# Patient Record
Sex: Female | Born: 1975 | Marital: Married
Health system: Southern US, Community
[De-identification: ages and names within clinical notes are randomized; demographics above are authoritative.]

## PROBLEM LIST (undated history)

## (undated) DIAGNOSIS — I639 Cerebral infarction, unspecified: Secondary | ICD-10-CM

## (undated) DIAGNOSIS — M797 Fibromyalgia: Secondary | ICD-10-CM

## (undated) DIAGNOSIS — E041 Nontoxic single thyroid nodule: Secondary | ICD-10-CM

## (undated) DIAGNOSIS — R911 Solitary pulmonary nodule: Secondary | ICD-10-CM

## (undated) DIAGNOSIS — G4700 Insomnia, unspecified: Secondary | ICD-10-CM

## (undated) DIAGNOSIS — F4310 Post-traumatic stress disorder, unspecified: Secondary | ICD-10-CM

## (undated) DIAGNOSIS — F32A Depression, unspecified: Secondary | ICD-10-CM

## (undated) HISTORY — PX: APPENDECTOMY: SHX54

## (undated) HISTORY — DX: Cerebral infarction, unspecified: I63.9

## (undated) HISTORY — PX: CHOLECYSTECTOMY: SHX55

---

## 2015-03-10 DIAGNOSIS — M545 Low back pain, unspecified: Secondary | ICD-10-CM | POA: Insufficient documentation

## 2015-03-10 DIAGNOSIS — G8929 Other chronic pain: Secondary | ICD-10-CM | POA: Insufficient documentation

## 2015-08-17 DIAGNOSIS — N926 Irregular menstruation, unspecified: Secondary | ICD-10-CM | POA: Insufficient documentation

## 2018-05-27 DIAGNOSIS — N6019 Diffuse cystic mastopathy of unspecified breast: Secondary | ICD-10-CM | POA: Insufficient documentation

## 2018-05-27 DIAGNOSIS — D4860 Neoplasm of uncertain behavior of unspecified breast: Secondary | ICD-10-CM | POA: Insufficient documentation

## 2018-05-27 DIAGNOSIS — Z803 Family history of malignant neoplasm of breast: Secondary | ICD-10-CM | POA: Insufficient documentation

## 2018-05-27 HISTORY — DX: Family history of malignant neoplasm of breast: Z80.3

## 2020-06-29 DIAGNOSIS — Z20822 Contact with and (suspected) exposure to covid-19: Secondary | ICD-10-CM

## 2020-06-29 DIAGNOSIS — G4700 Insomnia, unspecified: Secondary | ICD-10-CM | POA: Insufficient documentation

## 2020-06-29 DIAGNOSIS — M62838 Other muscle spasm: Secondary | ICD-10-CM | POA: Insufficient documentation

## 2020-06-29 DIAGNOSIS — J4540 Moderate persistent asthma, uncomplicated: Secondary | ICD-10-CM | POA: Insufficient documentation

## 2020-06-29 HISTORY — DX: Contact with and (suspected) exposure to covid-19: Z20.822

## 2020-07-16 DIAGNOSIS — R059 Cough, unspecified: Secondary | ICD-10-CM | POA: Insufficient documentation

## 2020-07-16 HISTORY — DX: Cough, unspecified: R05.9

## 2020-09-03 DIAGNOSIS — K219 Gastro-esophageal reflux disease without esophagitis: Secondary | ICD-10-CM | POA: Insufficient documentation

## 2020-09-03 DIAGNOSIS — F411 Generalized anxiety disorder: Secondary | ICD-10-CM | POA: Insufficient documentation

## 2020-09-04 DIAGNOSIS — Z87898 Personal history of other specified conditions: Secondary | ICD-10-CM | POA: Insufficient documentation

## 2020-09-04 DIAGNOSIS — R911 Solitary pulmonary nodule: Secondary | ICD-10-CM

## 2020-09-04 DIAGNOSIS — E079 Disorder of thyroid, unspecified: Secondary | ICD-10-CM | POA: Insufficient documentation

## 2020-09-04 HISTORY — DX: Solitary pulmonary nodule: R91.1

## 2020-09-15 DIAGNOSIS — R002 Palpitations: Secondary | ICD-10-CM | POA: Insufficient documentation

## 2020-09-15 DIAGNOSIS — I951 Orthostatic hypotension: Secondary | ICD-10-CM | POA: Insufficient documentation

## 2020-11-12 DIAGNOSIS — F331 Major depressive disorder, recurrent, moderate: Secondary | ICD-10-CM | POA: Insufficient documentation

## 2020-11-12 DIAGNOSIS — R251 Tremor, unspecified: Secondary | ICD-10-CM | POA: Insufficient documentation

## 2020-11-12 DIAGNOSIS — F4312 Post-traumatic stress disorder, chronic: Secondary | ICD-10-CM | POA: Insufficient documentation

## 2020-11-12 DIAGNOSIS — G939 Disorder of brain, unspecified: Secondary | ICD-10-CM | POA: Insufficient documentation

## 2021-02-11 DIAGNOSIS — D500 Iron deficiency anemia secondary to blood loss (chronic): Secondary | ICD-10-CM | POA: Insufficient documentation

## 2021-02-17 DIAGNOSIS — J020 Streptococcal pharyngitis: Secondary | ICD-10-CM | POA: Insufficient documentation

## 2021-02-17 HISTORY — DX: Streptococcal pharyngitis: J02.0

## 2021-04-16 ENCOUNTER — Other Ambulatory Visit: Payer: Self-pay

## 2021-04-16 ENCOUNTER — Ambulatory Visit: Admission: EM | Admit: 2021-04-16 | Discharge: 2021-04-16 | Disposition: A | Discharge status: Home / Self Care

## 2021-04-16 ENCOUNTER — Encounter: Payer: Self-pay | Admitting: Emergency Medicine

## 2021-04-16 DIAGNOSIS — B349 Viral infection, unspecified: Secondary | ICD-10-CM | POA: Diagnosis not present

## 2021-04-16 HISTORY — DX: Depression, unspecified: F32.A

## 2021-04-16 HISTORY — DX: Solitary pulmonary nodule: R91.1

## 2021-04-16 HISTORY — DX: Post-traumatic stress disorder, unspecified: F43.10

## 2021-04-16 HISTORY — DX: Insomnia, unspecified: G47.00

## 2021-04-16 HISTORY — DX: Nontoxic single thyroid nodule: E04.1

## 2021-04-16 HISTORY — DX: Fibromyalgia: M79.7

## 2021-04-16 NOTE — ED Triage Notes (Signed)
Patient c/o productive cough w/ " green" sputum x 4 days.   Patient c/o SOB x 2 days.   Patient endorses fever, chills, headache, and vomiting.   Patient endorses a temperature of 101.3 F at home.   Patient endorses chest pain when coughing.   Patient denies diarrhea or LOC.   Patient has used Advil for fever with relief of symptoms.

## 2021-04-16 NOTE — Discharge Instructions (Addendum)
Your COVID and Influenza tests are pending.  You should self quarantine until the test results are back.    Take Tylenol or ibuprofen as needed for fever or discomfort.  Rest and keep yourself hydrated.    Follow-up with your primary care provider if your symptoms are not improving.

## 2021-04-16 NOTE — ED Provider Notes (Signed)
[name]    CSN: [id number] Arrival date & time: 04/16/21  1020      History   Chief Complaint Chief Complaint  Patient presents with   Cough   Shortness of Breath     HPI Sandra Aguilar is a 45 y.o. female.  Patient presents with 4-day history of fever, earache, sore throat, cough, shortness of breath, nausea, vomiting.  She denies rash, diarrhea, or other symptoms.  Treatment attempted at home with Advil and Zofran; last taken this morning at 0800.  Her husband has similar symptoms.  Her medical history includes fibromyalgia, lung nodule, thyroid nodule, depression, PTSD, and insomnia.  The history is provided by the patient and medical records.   Past Medical History:  Diagnosis Date   Depression    Fibromyalgia    Insomnia    Lung nodule    PTSD (post-traumatic stress disorder)    Thyroid nodule     There are no problems to display for this patient.   Past Surgical History:  Procedure Laterality Date   APPENDECTOMY     CHOLECYSTECTOMY      OB History   No obstetric history on file.      Home Medications    Prior to Admission medications   Medication Sig Start Date End Date Taking? Authorizing Provider  FLUoxetine (PROZAC) 40 MG capsule Take 60 mg by mouth. 02/12/21  Yes [provider]  trazodone (DESYREL) 300 MG tablet Take 300 mg by mouth at bedtime. 02/12/21  Yes [provider]    Family History History reviewed. No pertinent family history.  Social History Social History   Tobacco Use   Smoking status: Never    Passive exposure: Never   Smokeless tobacco: Never  Substance Use Topics   Alcohol use: Yes    Comment: ocassionally   Drug use: Never     Allergies   Ciprofloxacin and Sulfa antibiotics   Review of Systems Review of Systems  Constitutional:  Positive for chills, fatigue and fever.  HENT:  Positive for ear pain and sore throat.   Eyes:  Negative for pain and visual disturbance.   Respiratory:  Positive for cough and shortness of breath.   Cardiovascular:  Negative for chest pain and palpitations.  Gastrointestinal:  Positive for nausea and vomiting. Negative for abdominal pain and diarrhea.  Genitourinary:  Negative for dysuria and hematuria.  Musculoskeletal:  Negative for arthralgias and back pain.  Skin:  Negative for color change and rash.  Neurological:  Negative for seizures and syncope.  All other systems reviewed and are negative.   Physical Exam Triage Vital Signs ED Triage Vitals  Enc Vitals Group     BP      Pulse      Resp      Temp      Temp src      SpO2      Weight      Height      Head Circumference      Peak Flow      Pain Score      Pain Loc      Pain Edu?      Excl. in [place]?    No data found.  Updated Vital Signs BP 106/71 (BP Location: Left Arm)   Pulse 86   Temp 98.3 F (36.8 C) (Oral)   Resp 18   LMP 03/27/2021 (Exact Date)   SpO2 95%   Visual Acuity Right Eye Distance:   Left  Eye Distance:   Bilateral Distance:    Right Eye Near:   Left Eye Near:    Bilateral Near:     Physical Exam Vitals and nursing note reviewed.  Constitutional:      General: She is not in acute distress.    Appearance: She is well-developed. She is ill-appearing.  HENT:     Head: Normocephalic and atraumatic.     Right Ear: Tympanic membrane normal.     Left Ear: Tympanic membrane normal.     Nose: Nose normal.     Mouth/Throat:     Mouth: Mucous membranes are moist.     Pharynx: Oropharynx is clear.  Eyes:     Conjunctiva/sclera: Conjunctivae normal.  Cardiovascular:     Rate and Rhythm: Normal rate and regular rhythm.     Heart sounds: Normal heart sounds.  Pulmonary:     Effort: Pulmonary effort is normal. No respiratory distress.     Breath sounds: Normal breath sounds.  Abdominal:     General: Bowel sounds are normal. There is no distension.     Palpations: Abdomen is soft.     Tenderness: There is no abdominal  tenderness. There is no guarding or rebound.  Musculoskeletal:     Cervical back: Neck supple.  Skin:    General: Skin is warm and dry.     Findings: No rash.  Neurological:     General: No focal deficit present.     Mental Status: She is alert and oriented to person, place, and time.     Gait: Gait normal.  Psychiatric:        Mood and Affect: Mood normal.        Behavior: Behavior normal.     UC Treatments / Results  Labs (all labs ordered are listed, but only abnormal results are displayed) Labs Reviewed  COVID-19, FLU A+B NAA    EKG   Radiology No results found.  Procedures Procedures (including critical care time)  Medications Ordered in UC Medications - No data to display  Initial Impression / Assessment and Plan / UC Course  I have reviewed the triage vital signs and the nursing notes.  Pertinent labs & imaging results that were available during my care of the patient were reviewed by me and considered in my medical decision making (see chart for details).   Viral illness.  Influenza and COVID pending.  Instructed patient to self quarantine per CDC guidelines.  Discussed symptomatic treatment including Tylenol or ibuprofen, rest, hydration.  Patient states she has Zofran at home already and does not need an additional prescription.  Instructed her to keep her self hydrated with clear liquids.  Instructed patient to follow up with PCP if her symptoms are not improving.  Patient agrees to plan of care.    Final Clinical Impressions(s) / UC Diagnoses   Final diagnoses:  Viral illness     Discharge Instructions      Your COVID and Influenza tests are pending.  You should self quarantine until the test results are back.    Take Tylenol or ibuprofen as needed for fever or discomfort.  Rest and keep yourself hydrated.    Follow-up with your primary care provider if your symptoms are not improving.         ED Prescriptions   None    PDMP not  reviewed this encounter.   Sharion Balloon, NP 04/16/21 1055

## 2021-04-18 LAB — COVID-19, FLU A+B NAA
Influenza A, NAA: NOT DETECTED
Influenza B, NAA: NOT DETECTED
SARS-CoV-2, NAA: NOT DETECTED

## 2021-04-21 ENCOUNTER — Encounter: Payer: Self-pay | Admitting: Emergency Medicine

## 2021-04-21 ENCOUNTER — Ambulatory Visit
Admission: EM | Admit: 2021-04-21 | Discharge: 2021-04-21 | Disposition: A | Discharge status: Home / Self Care | Attending: Emergency Medicine | Admitting: Emergency Medicine

## 2021-04-21 ENCOUNTER — Other Ambulatory Visit: Payer: Self-pay

## 2021-04-21 DIAGNOSIS — J0100 Acute maxillary sinusitis, unspecified: Secondary | ICD-10-CM | POA: Diagnosis not present

## 2021-04-21 MED ORDER — AZITHROMYCIN 250 MG PO TABS: 250.0000 mg | ORAL_TABLET | Freq: Every day | ORAL | 0 refills | Status: DC

## 2021-04-21 NOTE — ED Triage Notes (Signed)
Patient c/o productive cough w/ "dark green" sputum and fever x 7 days.   Patient endorses a temperature of 101 F at home.   Patient endorse generalized body aches and headaches. Patient endorses nasal congestion.   Patient denies SOB.   Patient was seen previously at this clinic. Patient states her COVID and Flu test was negative.   Patient has taken OTC cough medicine with no relief of symptoms.

## 2021-04-21 NOTE — ED Provider Notes (Signed)
Sandra Aguilar    CSN: [id number] Arrival date & time: 04/21/21  1456      History   Chief Complaint Chief Complaint  Patient presents with   Cough   Fever     HPI Sandra Aguilar is a 45 y.o. female.  Patient presents with ongoing fever, body aches, headaches, nasal congestion, cough productive of green sputum x7 days.  T-max 101.  She denies shortness of breath, vomiting, diarrhea, or other symptoms.  OTC treatment attempted at home.  Patient was seen here on 04/16/2021; diagnosed with viral illness; COVID and flu negative; treated symptomatically.  Her medical history includes chronic cough, chronic fatigue, chronic pain, thyroid nodule, fibromyalgia, PTSD, depression, panic attacks, insomnia.  The history is provided by the patient and medical records.   Past Medical History:  Diagnosis Date   Depression    Fibromyalgia    Insomnia    Lung nodule    PTSD (post-traumatic stress disorder)    Thyroid nodule     There are no problems to display for this patient.   Past Surgical History:  Procedure Laterality Date   APPENDECTOMY     CHOLECYSTECTOMY      OB History   No obstetric history on file.      Home Medications    Prior to Admission medications   Medication Sig Start Date End Date Taking? Authorizing Provider  azithromycin (ZITHROMAX) 250 MG tablet Take 1 tablet (250 mg total) by mouth daily. Take first 2 tablets together, then 1 every day until finished. 04/21/21  Yes Sharion Balloon, NP  FLUoxetine (PROZAC) 40 MG capsule Take 60 mg by mouth. 02/12/21  Yes [provider]  trazodone (DESYREL) 300 MG tablet Take 300 mg by mouth at bedtime. 02/12/21  Yes [provider]    Family History History reviewed. No pertinent family history.  Social History Social History   Tobacco Use   Smoking status: Never    Passive exposure: Never   Smokeless tobacco: Never  Substance Use Topics   Alcohol use: Yes    Comment: ocassionally    Drug use: Never     Allergies   Ciprofloxacin and Sulfa antibiotics   Review of Systems Review of Systems  Constitutional:  Positive for fever. Negative for chills.  HENT:  Positive for congestion. Negative for ear pain and sore throat.   Respiratory:  Positive for cough. Negative for shortness of breath.   Cardiovascular:  Negative for chest pain and palpitations.  Gastrointestinal:  Negative for abdominal pain and vomiting.  Skin:  Negative for color change and rash.  All other systems reviewed and are negative.   Physical Exam Triage Vital Signs ED Triage Vitals  Enc Vitals Group     BP      Pulse      Resp      Temp      Temp src      SpO2      Weight      Height      Head Circumference      Peak Flow      Pain Score      Pain Loc      Pain Edu?      Excl. in [place]?    No data found.  Updated Vital Signs BP 117/73 (BP Location: Left Arm)   Pulse 77   Temp 98.7 F (37.1 C) (Oral)   Resp 18   LMP 04/18/2021 (Exact Date)   SpO2 94%  Visual Acuity Right Eye Distance:   Left Eye Distance:   Bilateral Distance:    Right Eye Near:   Left Eye Near:    Bilateral Near:     Physical Exam Vitals and nursing note reviewed.  Constitutional:      General: She is not in acute distress.    Appearance: She is well-developed. She is not ill-appearing.  HENT:     Head: Normocephalic and atraumatic.     Right Ear: Tympanic membrane normal.     Left Ear: Tympanic membrane normal.     Nose: Congestion and rhinorrhea present.     Mouth/Throat:     Mouth: Mucous membranes are moist.     Pharynx: Oropharynx is clear.  Eyes:     Conjunctiva/sclera: Conjunctivae normal.  Cardiovascular:     Rate and Rhythm: Normal rate and regular rhythm.     Heart sounds: Normal heart sounds. No murmur heard. Pulmonary:     Effort: Pulmonary effort is normal. No respiratory distress.     Breath sounds: Normal breath sounds.  Abdominal:     Palpations: Abdomen is soft.      Tenderness: There is no abdominal tenderness.  Musculoskeletal:     Cervical back: Neck supple.  Skin:    General: Skin is warm and dry.  Neurological:     General: No focal deficit present.     Mental Status: She is alert and oriented to person, place, and time.     Gait: Gait normal.  Psychiatric:        Mood and Affect: Mood normal.        Behavior: Behavior normal.     UC Treatments / Results  Labs (all labs ordered are listed, but only abnormal results are displayed) Labs Reviewed - No data to display  EKG   Radiology No results found.  Procedures Procedures (including critical care time)  Medications Ordered in UC Medications - No data to display  Initial Impression / Assessment and Plan / UC Course  I have reviewed the triage vital signs and the nursing notes.  Pertinent labs & imaging results that were available during my care of the patient were reviewed by me and considered in my medical decision making (see chart for details).   Acute sinusitis.  Treating with Zithromax.  Instructed patient to continue symptomatic treatment with Tylenol or ibuprofen as needed for fever or discomfort and plain over-the-counter Mucinex as needed for congestion.  Instructed her to follow-up with her PCP if her symptoms are not improving.  She agrees to plan of care.   Final Clinical Impressions(s) / UC Diagnoses   Final diagnoses:  Acute non-recurrent maxillary sinusitis     Discharge Instructions      Take the Zithromax as directed.    Follow up with your primary care provider if your symptoms are not improving.         ED Prescriptions     Medication Sig Dispense Auth. Provider   azithromycin (ZITHROMAX) 250 MG tablet Take 1 tablet (250 mg total) by mouth daily. Take first 2 tablets together, then 1 every day until finished. 6 tablet Sharion Balloon, NP      I have reviewed the PDMP during this encounter.   Sharion Balloon, NP 04/21/21 [phone]

## 2021-04-21 NOTE — Discharge Instructions (Addendum)
Take the Zithromax as directed.  Follow up with your primary care provider if your symptoms are not improving.    

## 2021-05-11 DIAGNOSIS — G4733 Obstructive sleep apnea (adult) (pediatric): Secondary | ICD-10-CM | POA: Insufficient documentation

## 2021-05-11 DIAGNOSIS — M797 Fibromyalgia: Secondary | ICD-10-CM | POA: Insufficient documentation

## 2021-05-11 DIAGNOSIS — G43109 Migraine with aura, not intractable, without status migrainosus: Secondary | ICD-10-CM | POA: Insufficient documentation

## 2021-05-11 DIAGNOSIS — I8391 Asymptomatic varicose veins of right lower extremity: Secondary | ICD-10-CM | POA: Insufficient documentation

## 2021-05-11 DIAGNOSIS — K2100 Gastro-esophageal reflux disease with esophagitis, without bleeding: Secondary | ICD-10-CM | POA: Insufficient documentation

## 2021-05-31 ENCOUNTER — Other Ambulatory Visit (INDEPENDENT_AMBULATORY_CARE_PROVIDER_SITE_OTHER): Payer: Self-pay | Admitting: Nurse Practitioner

## 2021-05-31 DIAGNOSIS — I83811 Varicose veins of right lower extremities with pain: Secondary | ICD-10-CM

## 2021-06-02 ENCOUNTER — Encounter (INDEPENDENT_AMBULATORY_CARE_PROVIDER_SITE_OTHER): Admitting: Nurse Practitioner

## 2021-06-02 ENCOUNTER — Encounter (INDEPENDENT_AMBULATORY_CARE_PROVIDER_SITE_OTHER): Payer: Self-pay | Admitting: Nurse Practitioner

## 2021-06-02 ENCOUNTER — Encounter (INDEPENDENT_AMBULATORY_CARE_PROVIDER_SITE_OTHER)

## 2021-09-29 ENCOUNTER — Other Ambulatory Visit: Payer: Self-pay

## 2021-09-29 ENCOUNTER — Ambulatory Visit (INDEPENDENT_AMBULATORY_CARE_PROVIDER_SITE_OTHER)

## 2021-09-29 ENCOUNTER — Ambulatory Visit (INDEPENDENT_AMBULATORY_CARE_PROVIDER_SITE_OTHER): Admitting: Nurse Practitioner

## 2021-09-29 ENCOUNTER — Encounter (INDEPENDENT_AMBULATORY_CARE_PROVIDER_SITE_OTHER): Payer: Self-pay | Admitting: Nurse Practitioner

## 2021-09-29 VITALS — BP 115/79 | HR 91 | Resp 16 | Ht 65.5 in | Wt 116.0 lb

## 2021-09-29 DIAGNOSIS — I83811 Varicose veins of right lower extremities with pain: Secondary | ICD-10-CM | POA: Diagnosis not present

## 2021-09-29 DIAGNOSIS — M79604 Pain in right leg: Secondary | ICD-10-CM

## 2021-10-10 ENCOUNTER — Encounter (INDEPENDENT_AMBULATORY_CARE_PROVIDER_SITE_OTHER): Payer: Self-pay | Admitting: Nurse Practitioner

## 2021-10-10 NOTE — Progress Notes (Signed)
Subjective:    Patient ID: Sandra Aguilar, female    DOB: Dec 31, 1975, 45 y.o.   MRN: [id number] Chief Complaint  Patient presents with   New Patient (Initial Visit)    Consult vv of rle w/pain    Sandra Aguilar is a 45 year old female that presents today as a referral from [place], NP. The patient presents with pain in her right lower extremity and there is concern for posible thrombosis vs. Varicose vein.  The patient has a signifiant family history of DVT but she has had none herself.  She also has fibromyalgia.   Today noninvasive studies show no evidence of DVT or superficial thrombophlebitis.  No evidence of deep venous insufficiency or superficial venous reflux in the right lower extremity.   Review of Systems  Musculoskeletal:  Positive for myalgias.  All other systems reviewed and are negative.     Objective:   Physical Exam Vitals reviewed.  HENT:     Head: Normocephalic.  Cardiovascular:     Rate and Rhythm: Normal rate.     Pulses: Normal pulses.  Pulmonary:     Effort: Pulmonary effort is normal.  Musculoskeletal:     Right lower leg: No edema.     Left lower leg: No edema.  Skin:    General: Skin is warm and dry.  Neurological:     Mental Status: She is alert and oriented to person, place, and time.  Psychiatric:        Mood and Affect: Mood normal.        Behavior: Behavior normal.        Thought Content: Thought content normal.        Judgment: Judgment normal.    BP 115/79 (BP Location: Right Arm)   Pulse 91   Resp 16   Ht 5' 5.5" (1.664 m)   Wt 116 lb (52.6 kg)   BMI 19.01 kg/m   Past Medical History:  Diagnosis Date   Depression    Fibromyalgia    Insomnia    Lung nodule    PTSD (post-traumatic stress disorder)    Stroke (HCC)    Thyroid nodule     Social History   Socioeconomic History   Marital status: Married    Spouse name: Not on file   Number of children: Not on file   Years of education: Not on file   Highest  education level: Not on file  Occupational History   Not on file  Tobacco Use   Smoking status: Never    Passive exposure: Never   Smokeless tobacco: Never  Substance and Sexual Activity   Alcohol use: Yes    Comment: ocassionally   Drug use: Never   Sexual activity: Not on file  Other Topics Concern   Not on file  Social History Narrative   Not on file   Social Determinants of Health   Financial Resource Strain: Not on file  Food Insecurity: Not on file  Transportation Needs: Not on file  Physical Activity: Not on file  Stress: Not on file  Social Connections: Not on file  Intimate Partner Violence: Not on file    Past Surgical History:  Procedure Laterality Date   APPENDECTOMY     CHOLECYSTECTOMY      Family History  Problem Relation Age of Onset   Heart attack Father    Vascular Disease Father    Vascular Disease Maternal Uncle    Obesity Paternal Aunt    Vascular Disease Paternal  Aunt    Heart attack Maternal Grandmother    Vascular Disease Maternal Grandfather    Stroke Paternal Grandmother     Allergies  Allergen Reactions   Ciprofloxacin Other (See Comments) and Nausea And Vomiting    Neuropathy Neuropathy    Nitrofurantoin Hives    Other reaction(s): Hives/Swelling-Allergy   Sulfa Antibiotics Nausea And Vomiting and Other (See Comments)    CAUSES PAIN, PT REPORTS     No flowsheet data found.    CMP  No results found for: NA, K, CL, CO2, GLUCOSE, BUN, CREATININE, CALCIUM, PROT, ALBUMIN, AST, ALT, ALKPHOS, BILITOT, GFRNONAA, GFRAA   No results found.     Assessment & Plan:   1. Right leg pain The patient does have notable spider veins however there is no evidence of deep venous insufficiency or superficial venous reflux noted.  Typically the cause of pain as it relates to varicose veins is due to the presence of venous reflux.  There is also no evidence of DVT or superficial thrombophlebitis.  Based on the noninvasive studies the pain is  not related to varicose veins as well there is no intervention indicated.  Patient is advised to utilize medical grade compression stockings, elevation and activity.  She will follow-up with [place] on an as-needed basis.   Current Outpatient Medications on File Prior to Visit  Medication Sig Dispense Refill   sertraline (ZOLOFT) 50 MG tablet Take 50 mg by mouth daily.     trazodone (DESYREL) 300 MG tablet Take 300 mg by mouth at bedtime.     azithromycin (ZITHROMAX) 250 MG tablet Take 1 tablet (250 mg total) by mouth daily. Take first 2 tablets together, then 1 every day until finished. (Patient not taking: Reported on 09/29/2021) 6 tablet 0   FLUoxetine (PROZAC) 40 MG capsule Take 60 mg by mouth. (Patient not taking: Reported on 09/29/2021)     No current facility-administered medications on file prior to visit.    There are no Patient Instructions on file for this visit. No follow-ups on file.   Kris Hartmann, NP

## 2021-11-14 ENCOUNTER — Emergency Department

## 2021-11-14 ENCOUNTER — Emergency Department
Admission: EM | Admit: 2021-11-14 | Discharge: 2021-11-14 | Disposition: A | Discharge status: Home / Self Care | Attending: Emergency Medicine | Admitting: Emergency Medicine

## 2021-11-14 ENCOUNTER — Other Ambulatory Visit: Payer: Self-pay

## 2021-11-14 DIAGNOSIS — Z20822 Contact with and (suspected) exposure to covid-19: Secondary | ICD-10-CM | POA: Insufficient documentation

## 2021-11-14 DIAGNOSIS — B349 Viral infection, unspecified: Secondary | ICD-10-CM

## 2021-11-14 DIAGNOSIS — R509 Fever, unspecified: Secondary | ICD-10-CM | POA: Diagnosis present

## 2021-11-14 LAB — POC URINE PREG, ED: Preg Test, Ur: NEGATIVE

## 2021-11-14 LAB — URINALYSIS, ROUTINE W REFLEX MICROSCOPIC
Bilirubin Urine: NEGATIVE
Glucose, UA: NEGATIVE mg/dL
Hgb urine dipstick: NEGATIVE
Ketones, ur: NEGATIVE mg/dL
Leukocytes,Ua: NEGATIVE
Nitrite: NEGATIVE
Specific Gravity, Urine: >1.03 — ABNORMAL HIGH (ref 1.005–1.030)
pH: 6 (ref 5.0–8.0)

## 2021-11-14 LAB — BASIC METABOLIC PANEL
Anion gap: 6 (ref 5–15)
BUN: 10 mg/dL (ref 6–20)
CO2: 24 mmol/L (ref 22–32)
Calcium: 8.9 mg/dL (ref 8.9–10.3)
Chloride: 108 mmol/L (ref 98–111)
Creatinine, Ser: 0.61 mg/dL (ref 0.44–1.00)
GFR, Estimated: >60 mL/min (ref >60)
Glucose, Bld: 93 mg/dL (ref 70–99)
Potassium: 3.5 mmol/L (ref 3.5–5.1)
Sodium: 138 mmol/L (ref 135–145)

## 2021-11-14 LAB — CBC WITH DIFFERENTIAL/PLATELET
Abs Immature Granulocytes: 0.02 10*3/uL (ref 0.00–0.07)
Basophils Absolute: 0 10*3/uL (ref 0.0–0.1)
Basophils Relative: 1 %
Eosinophils Absolute: 0.2 10*3/uL (ref 0.0–0.5)
Eosinophils Relative: 2 %
HCT: 32.9 % — ABNORMAL LOW (ref 36.0–46.0)
Hemoglobin: 11.2 g/dL — ABNORMAL LOW (ref 12.0–15.0)
Immature Granulocytes: 0 %
Lymphocytes Relative: 39 %
Lymphs Abs: 3.2 10*3/uL (ref 0.7–4.0)
MCH: 30.8 pg (ref 26.0–34.0)
MCHC: 34 g/dL (ref 30.0–36.0)
MCV: 90.4 fL (ref 80.0–100.0)
Monocytes Absolute: 0.6 10*3/uL (ref 0.1–1.0)
Monocytes Relative: 7 %
Neutro Abs: 4.1 10*3/uL (ref 1.7–7.7)
Neutrophils Relative %: 51 %
Platelets: 222 10*3/uL (ref 150–400)
RBC: 3.64 MIL/uL — ABNORMAL LOW (ref 3.87–5.11)
RDW: 12.9 % (ref 11.5–15.5)
WBC: 8 10*3/uL (ref 4.0–10.5)
nRBC: 0 % (ref 0.0–0.2)

## 2021-11-14 LAB — RESP PANEL BY RT-PCR (FLU A&B, COVID) ARPGX2
Influenza A by PCR: NEGATIVE
Influenza B by PCR: NEGATIVE
SARS Coronavirus 2 by RT PCR: NEGATIVE

## 2021-11-14 LAB — URINALYSIS, MICROSCOPIC (REFLEX): Bacteria, UA: NONE SEEN

## 2021-11-14 LAB — GROUP A STREP BY PCR: Group A Strep by PCR: NOT DETECTED

## 2021-11-14 LAB — TSH: TSH: 0.886 u[IU]/mL (ref 0.350–4.500)

## 2021-11-14 LAB — TROPONIN I (HIGH SENSITIVITY): Troponin I (High Sensitivity): <2 ng/L (ref <18)

## 2021-11-14 MED ORDER — PREDNISONE 20 MG PO TABS: 40.0000 mg | ORAL_TABLET | Freq: Every day | ORAL | 0 refills | Status: AC

## 2021-11-14 MED ORDER — FLUTICASONE PROPIONATE 50 MCG/ACT NA SUSP: 2.0000 | Freq: Every day | NASAL | 0 refills | Status: AC

## 2021-11-14 MED ORDER — PSEUDOEPH-BROMPHEN-DM 30-2-10 MG/5ML PO SYRP: 5.0000 mL | ORAL_SOLUTION | Freq: Four times a day (QID) | ORAL | 0 refills | Status: DC | PRN

## 2021-11-14 MED ORDER — ALBUTEROL SULFATE HFA 108 (90 BASE) MCG/ACT IN AERS: 2.0000 | INHALATION_SPRAY | Freq: Four times a day (QID) | RESPIRATORY_TRACT | 0 refills | Status: DC | PRN

## 2021-11-14 NOTE — ED Provider Notes (Signed)
[facility] Provider Note  Patient Contact: 2:46 PM (approximate)   History   Fever and Generalized Body Aches  HPI  Sandra Aguilar is a 46 y.o. female presents to the ED for evaluation of ongoing cough, congestion, low grade fevers since testing positive for flu in December. She has completed a course of antibiotics. She reports some intermittent SOB and sore throat. She denies NV, abdominal pain.  She gives remote history of a benign thyroid nodule recently evaluated with fine-needle aspiration, as well as a stable long nodule.  Patient has not establish care with any specialist since transferring to the area from [place].  Physical Exam   Triage Vital Signs: ED Triage Vitals [11/14/21 1151]  Enc Vitals Group     BP (!) 109/57     Pulse Rate 91     Resp 19     Temp 98.2 F (36.8 C)     Temp Source Oral     SpO2 97 %     Weight 150 lb (68 kg)     Height 5\' 5"  (1.651 m)     Head Circumference      Peak Flow      Pain Score 6     Pain Loc      Pain Edu?      Excl. in [place]?     Most recent vital signs: Vitals:   11/14/21 1151  BP: (!) 109/57  Pulse: 91  Resp: 19  Temp: 98.2 F (36.8 C)  SpO2: 97%     General: Alert and in no acute distress. Head: No acute traumatic findings Ears: TMs intact. No effusions  Nose: No congestion/rhinnorhea. Mouth/Throat: Mucous membranes are moist. Neck: No stridor. No cervical spine tenderness to palpation.  Thyroid is soft without palpable nodularity or goiter Hematological/Lymphatic/Immunilogical: No cervical lymphadenopathy. Cardiovascular:  Good peripheral perfusion Respiratory: Normal respiratory effort without tachypnea or retractions. Lungs CTAB.  Musculoskeletal: Full range of motion to all extremities.  Neurologic:  No gross focal neurologic deficits are appreciated.  Skin:   No rash noted  ED Results / Procedures / Treatments   Labs (all labs ordered are listed, but only abnormal  results are displayed) Labs Reviewed  URINALYSIS, ROUTINE W REFLEX MICROSCOPIC - Abnormal; Notable for the following components:      Result Value   Specific Gravity, Urine >1.030 (*)    Protein, ur TRACE (*)    All other components within normal limits  CBC WITH DIFFERENTIAL/PLATELET - Abnormal; Notable for the following components:   RBC 3.64 (*)    Hemoglobin 11.2 (*)    HCT 32.9 (*)    All other components within normal limits  GROUP A STREP BY PCR  RESP PANEL BY RT-PCR (FLU A&B, COVID) ARPGX2  BASIC METABOLIC PANEL  TSH  URINALYSIS, MICROSCOPIC (REFLEX)  POC URINE PREG, ED  TROPONIN I (HIGH SENSITIVITY)     EKG   RADIOLOGY  I personally viewed and evaluated these images as part of my medical decision making, as well as reviewing the written report by the radiologist.  ED Provider Interpretation: NAD   DG Chest 2 View  Result Date: 11/14/2021 CLINICAL DATA:  Cough for 3 weeks. EXAM: CHEST - 2 VIEW COMPARISON:  None. FINDINGS: Normal heart, mediastinum and hila. Clear lungs.  No pleural effusion or pneumothorax. Skeletal structures are unremarkable. IMPRESSION: Normal chest radiographs. Electronically Signed   By: Lajean Manes M.D.   On: 11/14/2021 14:51    PROCEDURES:  Critical Care performed: No  Procedures   MEDICATIONS ORDERED IN ED: Medications - No data to display   IMPRESSION / MDM / [place] / ED COURSE  I reviewed the triage vital signs and the nursing notes.                              Differential diagnosis includes, but is not limited to, viral URI, bronchitis, CAP, covid, influenza, strep throat   Patient ED evaluation of ongoing intermittent cough, body aches, malaise, and subjective fevers.  Patient is evaluated for complaints in ED, found have a reassuring work-up at this time.  No radiologic evidence of any acute intrathoracic process on x-ray reviewed by me.  Viral panel screen is negative as is her strep panel.  No acute anemia  or leukocytosis on her CBC, and no electrolyte abnormality on imaging panel.  Thyroid function is also found to be normal, and troponin is negative without a increased concern for ACS.  Patient's diagnosis is consistent with viral URI with cough. Patient will be discharged home with prescriptions for prescriptions for steroids, inhalers, cough medicine, and nasal steroids. Patient is to follow up with her PCP as well as other specialist as referred for chronic condition management, as needed or otherwise directed. Patient is given ED precautions to return to the ED for any worsening or new symptoms.   FINAL CLINICAL IMPRESSION(S) / ED DIAGNOSES   Final diagnoses:  Viral syndrome     Rx / DC Orders   ED Discharge Orders          Ordered    brompheniramine-pseudoephedrine-DM 30-2-10 MG/5ML syrup  4 times daily PRN        11/14/21 1604    albuterol (VENTOLIN HFA) 108 (90 Base) MCG/ACT inhaler  Every 6 hours PRN        11/14/21 1604    fluticasone (FLONASE) 50 MCG/ACT nasal spray  Daily        11/14/21 1604    predniSONE (DELTASONE) 20 MG tablet  Daily with breakfast        11/14/21 1604             Note:  This document was prepared using Dragon voice recognition software and may include unintentional dictation errors.    Melvenia Needles, PA-C 11/14/21 1939    Arta Silence, MD 11/19/21 1505

## 2021-11-14 NOTE — ED Notes (Signed)
Patient c/o congestion, cough, sore throat, and body aches. Patient requesting d-dimer and states "It is always high and it is what they base my inflammation off of"

## 2021-11-14 NOTE — ED Triage Notes (Signed)
Pt to ER via POV with complaints of congestion/ cough/ sore throat/ generalized body aches since 11/06/21. Reports being negative for flu/ covid on Wednesday but continues to feel worse.   Flu positive December 19th, reports feeling worse with this illness.

## 2021-11-14 NOTE — Discharge Instructions (Addendum)
Your labs, chest XR, and exam are all normal and reassuring at this time. Take the prescription meds as directed. Follow-up with your PCP and the specialists listed below, for ongoing management of  your chronic conditions.

## 2021-11-16 ENCOUNTER — Encounter: Payer: Self-pay | Admitting: Radiology

## 2021-11-16 ENCOUNTER — Emergency Department

## 2021-11-16 ENCOUNTER — Other Ambulatory Visit: Payer: Self-pay

## 2021-11-16 ENCOUNTER — Emergency Department
Admission: EM | Admit: 2021-11-16 | Discharge: 2021-11-16 | Disposition: A | Discharge status: Home / Self Care | Attending: Emergency Medicine | Admitting: Emergency Medicine

## 2021-11-16 DIAGNOSIS — R110 Nausea: Secondary | ICD-10-CM | POA: Diagnosis not present

## 2021-11-16 DIAGNOSIS — R0602 Shortness of breath: Secondary | ICD-10-CM | POA: Diagnosis not present

## 2021-11-16 DIAGNOSIS — R079 Chest pain, unspecified: Secondary | ICD-10-CM | POA: Diagnosis present

## 2021-11-16 LAB — URINALYSIS, ROUTINE W REFLEX MICROSCOPIC
Bacteria, UA: NONE SEEN
Bilirubin Urine: NEGATIVE
Glucose, UA: NEGATIVE mg/dL
Hgb urine dipstick: NEGATIVE
Ketones, ur: 5 mg/dL — AB
Nitrite: NEGATIVE
Protein, ur: 30 mg/dL — AB
Specific Gravity, Urine: 1.026 (ref 1.005–1.030)
pH: 5 (ref 5.0–8.0)

## 2021-11-16 LAB — TROPONIN I (HIGH SENSITIVITY)
Troponin I (High Sensitivity): <2 ng/L (ref <18)
Troponin I (High Sensitivity): <2 ng/L (ref <18)

## 2021-11-16 LAB — CBC
HCT: 36.6 % (ref 36.0–46.0)
Hemoglobin: 12.6 g/dL (ref 12.0–15.0)
MCH: 31 pg (ref 26.0–34.0)
MCHC: 34.4 g/dL (ref 30.0–36.0)
MCV: 90.1 fL (ref 80.0–100.0)
Platelets: 293 10*3/uL (ref 150–400)
RBC: 4.06 MIL/uL (ref 3.87–5.11)
RDW: 12.7 % (ref 11.5–15.5)
WBC: 14.4 10*3/uL — ABNORMAL HIGH (ref 4.0–10.5)
nRBC: 0 % (ref 0.0–0.2)

## 2021-11-16 LAB — BASIC METABOLIC PANEL
Anion gap: 10 (ref 5–15)
BUN: 8 mg/dL (ref 6–20)
CO2: 21 mmol/L — ABNORMAL LOW (ref 22–32)
Calcium: 9.8 mg/dL (ref 8.9–10.3)
Chloride: 104 mmol/L (ref 98–111)
Creatinine, Ser: 0.74 mg/dL (ref 0.44–1.00)
GFR, Estimated: >60 mL/min (ref >60)
Glucose, Bld: 177 mg/dL — ABNORMAL HIGH (ref 70–99)
Potassium: 3.4 mmol/L — ABNORMAL LOW (ref 3.5–5.1)
Sodium: 135 mmol/L (ref 135–145)

## 2021-11-16 LAB — D-DIMER, QUANTITATIVE: D-Dimer, Quant: 0.73 ug/mL-FEU — ABNORMAL HIGH (ref 0.00–0.50)

## 2021-11-16 LAB — POC URINE PREG, ED: Preg Test, Ur: NEGATIVE

## 2021-11-16 MED ORDER — MORPHINE SULFATE (PF) 4 MG/ML IV SOLN
4.0000 mg | Freq: Once | INTRAVENOUS | Status: AC
  Administered 2021-11-16: 4 mg via INTRAVENOUS
  Filled 2021-11-16: qty 1

## 2021-11-16 MED ORDER — ONDANSETRON 4 MG PO TBDP
4.0000 mg | ORAL_TABLET | Freq: Once | ORAL | Status: AC
  Administered 2021-11-16: 4 mg via ORAL
  Filled 2021-11-16: qty 1

## 2021-11-16 MED ORDER — KETOROLAC TROMETHAMINE 30 MG/ML IJ SOLN
30.0000 mg | Freq: Once | INTRAMUSCULAR | Status: AC
  Administered 2021-11-16: 30 mg via INTRAVENOUS
  Filled 2021-11-16: qty 1

## 2021-11-16 MED ORDER — IOHEXOL 350 MG/ML SOLN
100.0000 mL | Freq: Once | INTRAVENOUS | Status: AC | PRN
  Administered 2021-11-16: 100 mL via INTRAVENOUS
  Filled 2021-11-16: qty 100

## 2021-11-16 MED ORDER — IBUPROFEN 600 MG PO TABS
600.0000 mg | ORAL_TABLET | Freq: Once | ORAL | Status: AC
  Administered 2021-11-16: 600 mg via ORAL
  Filled 2021-11-16: qty 1

## 2021-11-16 NOTE — ED Provider Notes (Signed)
[facility] Provider Note    Event Date/Time   First MD Initiated Contact with Patient 11/16/21 1456     (approximate)   History   Chief Complaint Chest Pain   HPI  Sandra Aguilar is a 46 y.o. female, history of aortitis, stroke, fibromyalgia, PTSD, lung nodule, presents emergency department for evaluation of chest pain.  Patient states that the pain started earlier today when she was watching TV.  Describes the pain as a squeezing sensation, 10/10, sudden onset, radiation to her right upper extremity. Additionally endorses some shortness of breath and nausea. She states that a few years back, she experienced a inflammatory condition of her heart following a diagnosis of COVID.  She states that this feels similar.  Denies abdominal pain, back pain, urinary symptoms, headache, or vomiting.  Patient was recently seen here on 11/14/2020 where she was diagnosed with a viral syndrome.  At the time, she was reporting cough, congestion, low-grade fevers, and sore throat.  She was prescribed steroids, cough medicine, and inhalers.  She states that her respiratory symptoms have improved since the last visit, but her chest pain has worsened.  History Limitations: No limitations.      Physical Exam  Triage Vital Signs: ED Triage Vitals  Enc Vitals Group     BP 11/16/21 1351 140/83     Pulse Rate 11/16/21 1351 100     Resp 11/16/21 1351 17     Temp 11/16/21 1351 98.4 F (36.9 C)     Temp Source 11/16/21 1351 Oral     SpO2 11/16/21 1351 96 %     Weight --      Height --      Head Circumference --      Peak Flow --      Pain Score 11/16/21 1352 10     Pain Loc --      Pain Edu? --      Excl. in [place]? --     Most recent vital signs: Vitals:   11/16/21 1351  BP: 140/83  Pulse: 100  Resp: 17  Temp: 98.4 F (36.9 C)  SpO2: 96%     Physical Exam Constitutional:      General: She is not in acute distress.    Appearance: Normal appearance. She is not  ill-appearing or toxic-appearing.     Comments: Appears distressed.  She is tearful upon me entering the room.  Cardiovascular:     Pulses: Normal pulses.     Heart sounds: No murmur heard.   No friction rub. No gallop.  Pulmonary:     Effort: Pulmonary effort is normal.     Breath sounds: Normal breath sounds.     Comments: Patient endorses significant tenderness when palpating the right side of her chest. Abdominal:     General: Abdomen is flat.     Palpations: Abdomen is soft.     Tenderness: There is no abdominal tenderness.  Skin:    General: Skin is warm and dry.     Capillary Refill: Capillary refill takes less than 2 seconds.  Neurological:     Mental Status: She is alert and oriented to person, place, and time. Mental status is at baseline.      ED Results / Procedures / Treatments  Labs (all labs ordered are listed, but only abnormal results are displayed) Labs Reviewed  BASIC METABOLIC PANEL - Abnormal; Notable for the following components:      Result Value   Potassium  3.4 (*)    CO2 21 (*)    Glucose, Bld 177 (*)    All other components within normal limits  CBC - Abnormal; Notable for the following components:   WBC 14.4 (*)    All other components within normal limits  URINALYSIS, ROUTINE W REFLEX MICROSCOPIC - Abnormal; Notable for the following components:   Color, Urine YELLOW (*)    APPearance HAZY (*)    Ketones, ur 5 (*)    Protein, ur 30 (*)    Leukocytes,Ua LARGE (*)    All other components within normal limits  D-DIMER, QUANTITATIVE - Abnormal; Notable for the following components:   D-Dimer, Quant 0.73 (*)    All other components within normal limits  POC URINE PREG, ED  TROPONIN I (HIGH SENSITIVITY)  TROPONIN I (HIGH SENSITIVITY)     EKG Sinus rhythm, rate of 98, no acute ST segment changes, prolonged QT, no AV blocks, no axis deviation.   RADIOLOGY I personally viewed and evaluated these images as part of my medical decision making,  as well as reviewing the written report by the radiologist.  ED Provider Interpretation: I agree with the interpretation the radiologist.  No active cardiopulmonary disease on chest x-ray.  No acute pulmonary embolism or lung findings on CT.  DG Chest 2 View  Result Date: 11/16/2021 CLINICAL DATA:  Chest pain EXAM: CHEST - 2 VIEW COMPARISON:  11/14/2021 FINDINGS: The heart size and mediastinal contours are within normal limits. Both lungs are clear. The visualized skeletal structures are unremarkable. IMPRESSION: No active cardiopulmonary disease. Electronically Signed   By: Franchot Gallo M.D.   On: 11/16/2021 15:36   CT Angio Chest Aorta w/CM &/OR wo/CM  Result Date: 11/16/2021 CLINICAL DATA:  Chest pain. History of aortitis. Seen 11/14/2020 for a viral syndrome. Ongoing cough and congestion and low-grade fever since testing positive for flu 10/18/2021. hospital evaluate for pulmonary embolism. EXAM: CT ANGIOGRAPHY CHEST WITH CONTRAST TECHNIQUE: Multidetector CT imaging of the chest was performed using the standard protocol during bolus administration of intravenous contrast. Multiplanar CT image reconstructions and MIPs were obtained to evaluate the vascular anatomy. RADIATION DOSE REDUCTION: This exam was performed according to the departmental dose-optimization program which includes automated exposure control, adjustment of the mA and/or kV according to patient size and/or use of iterative reconstruction technique. CONTRAST:  110mL OMNIPAQUE IOHEXOL 350 MG/ML SOLN COMPARISON:  Chest two views 1 17 2023 and 11/14/2021 FINDINGS: Cardiovascular: Heart size is normal. No pericardial effusion. No thoracic aortic aneurysm. No aortic dissection is seen. Mediastinum/Nodes: There is a low-density left thyroid lobe nodule measuring up to approximately 11 mm. Reportedly the patient has a history of benign thyroid nodule evaluated fine-needle aspiration recently. No axillary, mediastinal, or hilar pathologically  enlarged lymph nodes by CT criteria. The esophagus follows a normal course normal caliber. No filling defect is seen to indicate acute pulmonary embolism. Lungs/Pleura: The central airways are patent. The lungs are clear. No pleural effusion or pneumothorax. Upper Abdomen: Status post cholecystectomy. Musculoskeletal: No chest wall abnormality. No acute or significant osseous findings. Review of the MIP images confirms the above findings. IMPRESSION: 1. No acute pulmonary embolism is seen. 2. Clear lungs. 3. There is an 11 mm left thyroid lobe low-density nodule. Reportedly the patient has a history of a benign thyroid nodule recently evaluated with fine-needle aspiration. Electronically Signed   By: Yvonne Kendall M.D.   On: 11/16/2021 17:23    PROCEDURES:  Critical Care performed: None.  Procedures  MEDICATIONS ORDERED IN ED: Medications  ondansetron (ZOFRAN-ODT) disintegrating tablet 4 mg (4 mg Oral Given 11/16/21 1537)  ibuprofen (ADVIL) tablet 600 mg (600 mg Oral Given 11/16/21 1537)  morphine 4 MG/ML injection 4 mg (4 mg Intravenous Given 11/16/21 1640)  iohexol (OMNIPAQUE) 350 MG/ML injection 100 mL (100 mLs Intravenous Contrast Given 11/16/21 1657)  ketorolac (TORADOL) 30 MG/ML injection 30 mg (30 mg Intravenous Given 11/16/21 1924)     IMPRESSION / MDM / ASSESSMENT AND PLAN / ED COURSE  I reviewed the triage vital signs and the nursing notes.                              Billi Birdena Aguilar is a 46 y.o. female, history of aortitis, stroke, fibromyalgia, PTSD, lung nodule, presents emergency department for evaluation of chest pain.  Patient states that the pain started earlier today when she was watching TV.  Describes the pain as a squeezing sensation, 10/10, sudden onset, radiation to her right upper extremity.  Differentials included, but not limited to: Serious: aortic dissection, aortitis, ACS, pulmonary embolism, cardiac tamponade, Boerhaave syndrome, pneumothorax, myocarditis,  pericarditis, acute chest syndrome, aortic stenosis. Common/Non-emergent: gastritis, esophagitis, costochondritis, pneumonia, anxiety   Patient appears distressed, no nontoxic physical exam notable for significant tenderness when palpating the right side.  She is afebrile.  Vital signs unremarkable, although she is borderline tachycardic with a heart rate of 100.  CBC notable for leukocytosis at 14.4.  No anemia.  BMP shows hyperglycemia at 177, otherwise unremarkable.  Urine pregnancy negative.  Troponin is less than 2. D-dimer positive at 0.73.  EKG is unremarkable for STEMI  Urinalysis notable for large leukocytes.  In the absence of urinary symptoms, unlikely to be urinary tract infection  Chest x-ray unremarkable.  No active cardiopulmonary disease.  Given the patient's history of aortitis, will order CT angio chest aorta to evaluate for dissection/aortitis, as well as any pulmonary embolism given the patient's positive D-dimer, though I have a low suspicion for pulmonary embolism based on Wells criteria.  Patient was initially treated for pain/nausea with 600 mg ibuprofen and 4 mg ondansetron.  After approximately 2 hours, patient states that she is still in significant pain.  Will treat with 4 mg morphine.  CT angio shows no evidence of pulmonary embolism or aortitis/dissection/aneurysm.  Upon reexamination, patient states that she is feeling much better than before.  Given the patient's history, physical exam, and work-up thus far, I do not suspect any serious or life-threatening pathology.  Given the patient's pleuritic chest pain, recent viral syndrome, and notable tenderness when palpating the chest wall, it is possible patient is experiencing a mild pericarditis.  We will give ketorolac IV now.  We will plan to discharge this patient with cardiology follow-up.  Advised her to treat with ibuprofen as needed.  Patient was provided with anticipatory guidance, return precautions, and  educational material. Encouraged the patient to return to the emergency department at any time if they begin to experience any new or worsening symptoms.       FINAL CLINICAL IMPRESSION(S) / ED DIAGNOSES   Final diagnoses:  Chest pain, unspecified type     Rx / DC Orders   ED Discharge Orders     None        Note:  This document was prepared using Dragon voice recognition software and may include unintentional dictation errors.   Teodoro Spray, [place] 11/16/21 [phone]  Rada Hay, MD 11/17/21 [phone]

## 2021-11-16 NOTE — ED Triage Notes (Signed)
Pt presents to ED with c/o of chest pain. Pt seen at 11/14/2020 for a viral syndrome. Pt is A&Ox4. Pt is tearful from the "pain". Pt is A&Ox4.

## 2021-11-16 NOTE — Discharge Instructions (Addendum)
-  Please follow-up with your primary care provider and cardiologist as discussed. -Return to the emergency department anytime if you begin to experience any new or worsening symptoms.

## 2022-04-12 ENCOUNTER — Emergency Department

## 2022-04-12 ENCOUNTER — Emergency Department
Admission: EM | Admit: 2022-04-12 | Discharge: 2022-04-12 | Disposition: A | Discharge status: Home / Self Care | Attending: Student in an Organized Health Care Education/Training Program | Admitting: Student in an Organized Health Care Education/Training Program

## 2022-04-12 ENCOUNTER — Other Ambulatory Visit: Payer: Self-pay

## 2022-04-12 DIAGNOSIS — I4589 Other specified conduction disorders: Secondary | ICD-10-CM | POA: Diagnosis not present

## 2022-04-12 DIAGNOSIS — D219 Benign neoplasm of connective and other soft tissue, unspecified: Secondary | ICD-10-CM

## 2022-04-12 DIAGNOSIS — R9431 Abnormal electrocardiogram [ECG] [EKG]: Secondary | ICD-10-CM

## 2022-04-12 DIAGNOSIS — R1084 Generalized abdominal pain: Secondary | ICD-10-CM | POA: Diagnosis present

## 2022-04-12 DIAGNOSIS — M545 Low back pain, unspecified: Secondary | ICD-10-CM | POA: Insufficient documentation

## 2022-04-12 LAB — COMPREHENSIVE METABOLIC PANEL
ALT: 18 U/L (ref 0–44)
AST: 24 U/L (ref 15–41)
Albumin: 3.7 g/dL (ref 3.5–5.0)
Alkaline Phosphatase: 57 U/L (ref 38–126)
Anion gap: 9 (ref 5–15)
BUN: 8 mg/dL (ref 6–20)
CO2: 26 mmol/L (ref 22–32)
Calcium: 9 mg/dL (ref 8.9–10.3)
Chloride: 102 mmol/L (ref 98–111)
Creatinine, Ser: 0.81 mg/dL (ref 0.44–1.00)
GFR, Estimated: >60 mL/min (ref >60)
Glucose, Bld: 149 mg/dL — ABNORMAL HIGH (ref 70–99)
Potassium: 3.6 mmol/L (ref 3.5–5.1)
Sodium: 137 mmol/L (ref 135–145)
Total Bilirubin: 0.6 mg/dL (ref 0.3–1.2)
Total Protein: 7.3 g/dL (ref 6.5–8.1)

## 2022-04-12 LAB — WET PREP, GENITAL
Clue Cells Wet Prep HPF POC: NONE SEEN
Sperm: NONE SEEN
Trich, Wet Prep: NONE SEEN
WBC, Wet Prep HPF POC: <10 (ref <10)
Yeast Wet Prep HPF POC: NONE SEEN

## 2022-04-12 LAB — CHLAMYDIA/NGC RT PCR (ARMC ONLY)
Chlamydia Tr: NOT DETECTED
N gonorrhoeae: NOT DETECTED

## 2022-04-12 LAB — CBC
HCT: 35.9 % — ABNORMAL LOW (ref 36.0–46.0)
Hemoglobin: 11.6 g/dL — ABNORMAL LOW (ref 12.0–15.0)
MCH: 30.6 pg (ref 26.0–34.0)
MCHC: 32.3 g/dL (ref 30.0–36.0)
MCV: 94.7 fL (ref 80.0–100.0)
Platelets: 230 10*3/uL (ref 150–400)
RBC: 3.79 MIL/uL — ABNORMAL LOW (ref 3.87–5.11)
RDW: 13 % (ref 11.5–15.5)
WBC: 6.9 10*3/uL (ref 4.0–10.5)
nRBC: 0 % (ref 0.0–0.2)

## 2022-04-12 LAB — URINALYSIS, ROUTINE W REFLEX MICROSCOPIC
Bacteria, UA: NONE SEEN
Bilirubin Urine: NEGATIVE
Glucose, UA: NEGATIVE mg/dL
Ketones, ur: NEGATIVE mg/dL
Leukocytes,Ua: NEGATIVE
Nitrite: NEGATIVE
Protein, ur: NEGATIVE mg/dL
Specific Gravity, Urine: 1.018 (ref 1.005–1.030)
pH: 6 (ref 5.0–8.0)

## 2022-04-12 LAB — POC URINE PREG, ED: Preg Test, Ur: NEGATIVE

## 2022-04-12 LAB — LIPASE, BLOOD: Lipase: 29 U/L (ref 11–51)

## 2022-04-12 MED ORDER — OXYCODONE-ACETAMINOPHEN 5-325 MG PO TABS
1.0000 | ORAL_TABLET | Freq: Once | ORAL | Status: AC
  Administered 2022-04-12: 1 via ORAL
  Filled 2022-04-12: qty 1

## 2022-04-12 MED ORDER — HYDROXYZINE HCL 25 MG PO TABS: 25.0000 mg | ORAL_TABLET | Freq: Every evening | ORAL | 0 refills | Status: DC | PRN

## 2022-04-12 MED ORDER — TEMAZEPAM 7.5 MG PO CAPS: 7.5000 mg | ORAL_CAPSULE | Freq: Every evening | ORAL | 0 refills | Status: DC | PRN

## 2022-04-12 MED ORDER — HYDROCODONE-ACETAMINOPHEN 5-325 MG PO TABS
1.0000 | ORAL_TABLET | Freq: Four times a day (QID) | ORAL | 0 refills | Status: DC | PRN
Start: 2022-04-12 — End: 2022-12-13

## 2022-04-12 NOTE — ED Notes (Signed)
See triage note  presents with some abd pain   states sx's started about 2 weeks ago  having some urinary sx''s no fever

## 2022-04-12 NOTE — ED Triage Notes (Signed)
Pt comes with c/o abdominal pain, nausea, back pain. Pt states this started few days ago. Pt states possible UTI.

## 2022-04-12 NOTE — ED Provider Notes (Signed)
Wet prep negative. UPT negative. UA negative for UTI. US obtained, reviewed, shows fibroids but is o/w unremarkable. Suspect possible chronic pelvic pain, pain from fibroids, also reportedly had abnormal appearing cervix on exam (though not c/f cervicitis). Will refer to OBGYn for follow-up. Of note, QT prolonged on EKG and pt is on trazodone for sleep. H/o severe insomnia. She will stop trazodone but is very hesitant about not taking anything. Will avoid seroquel, ambien due to QT. Will trial a very short course of restoril, d/c with outpt follow-up.   Duffy Bruce, MD 04/12/22 [phone]

## 2022-04-12 NOTE — ED Provider Notes (Signed)
[facility] Provider Note    Event Date/Time   First MD Initiated Contact with Patient 04/12/22 1411     (approximate)   History   Abdominal Pain   HPI  Sandra Aguilar is a 46 y.o. female   history of depression, fibromyalgia, insomnia, PTSD presents to the ER for evaluation of several days of generalized abdominal pain and pelvic discomfort.  States that she is on antibiotics ordered by her primary care provider but is uncertain as to why she is on these.  She denies any dysuria.  Has had some low back pain.  No diarrhea.  No lateralizing abdominal pain has had some irregular discharge.  States that she does have a history of abnormal Pap smears.      Physical Exam   Triage Vital Signs: ED Triage Vitals  Enc Vitals Group     BP 04/12/22 1358 114/65     Pulse Rate 04/12/22 1358 99     Resp 04/12/22 1358 16     Temp 04/12/22 1358 98.1 F (36.7 C)     Temp Source 04/12/22 1358 Oral     SpO2 04/12/22 1358 97 %     Weight 04/12/22 1358 150 lb (68 kg)     Height 04/12/22 1358 '5\' 5"'$  (1.651 m)     Head Circumference --      Peak Flow --      Pain Score 04/12/22 1342 10     Pain Loc --      Pain Edu? --      Excl. in [place]? --     Most recent vital signs: Vitals:   04/12/22 1358  BP: 114/65  Pulse: 99  Resp: 16  Temp: 98.1 F (36.7 C)  SpO2: 97%     Constitutional: Alert  Eyes: Conjunctivae are normal.  Head: Atraumatic. Nose: No congestion/rhinnorhea. Mouth/Throat: Mucous membranes are moist.   Neck: Painless ROM.  Cardiovascular:   Good peripheral circulation. Respiratory: Normal respiratory effort.  No retractions.  Gastrointestinal: Soft and nontender in all four quadrants.  Pelvic exam with some mild discharge coming from the cervix with a regular shaped cervix.  No significant cervical motion tenderness. Musculoskeletal:  no deformity Neurologic:  MAE spontaneously. No gross focal neurologic deficits are appreciated.  Skin:   Skin is warm, dry and intact. No rash noted. Psychiatric: Mood and affect are normal. Speech and behavior are normal.    ED Results / Procedures / Treatments   Labs (all labs ordered are listed, but only abnormal results are displayed) Labs Reviewed  COMPREHENSIVE METABOLIC PANEL - Abnormal; Notable for the following components:      Result Value   Glucose, Bld 149 (*)    All other components within normal limits  CBC - Abnormal; Notable for the following components:   RBC 3.79 (*)    Hemoglobin 11.6 (*)    HCT 35.9 (*)    All other components within normal limits  URINALYSIS, ROUTINE W REFLEX MICROSCOPIC - Abnormal; Notable for the following components:   Color, Urine YELLOW (*)    APPearance CLEAR (*)    Hgb urine dipstick SMALL (*)    All other components within normal limits  WET PREP, GENITAL  CHLAMYDIA/NGC RT PCR ([facility] ONLY)            LIPASE, BLOOD  POC URINE PREG, ED     EKG  ED ECG REPORT I, Merlyn Lot, the attending physician, personally viewed and interpreted this ECG.  Date: 04/12/2022  EKG Time: 14:02  Rate: 85  Rhythm: sinus  Axis: normal  Intervals: prolonged qt  ST&T Change: no stemi    RADIOLOGY Please see ED Course for my review and interpretation.  I personally reviewed all radiographic images ordered to evaluate for the above acute complaints and reviewed radiology reports and findings.  These findings were personally discussed with the patient.  Please see medical record for radiology report.    PROCEDURES:  Critical Care performed: No  Procedures   MEDICATIONS ORDERED IN ED: Medications  oxyCODONE-acetaminophen (PERCOCET/ROXICET) 5-325 MG per tablet 1 tablet (1 tablet Oral Given 04/12/22 1442)     IMPRESSION / MDM / ASSESSMENT AND PLAN / ED COURSE  I reviewed the triage vital signs and the nursing notes.                              Differential diagnosis includes, but is not limited to, PID, UTI, dehydration,  electrolyte abnormality, colitis, diverticulitis,  This patient presented to the ER for evaluation of symptoms as described above.  Abdominal exam is soft and nontender.  Have low suspicion for appendicitis or colitis.  Blood work is reassuring with no white count not consistent pancreatitis.  No sign of UTI.  Will perform pelvic exam with chaperone.   Clinical Course as of 04/12/22 1520  Tue Apr 12, 2022  1456 Patient was signed out to oncoming physician pending follow-up ultrasound results.  We also discussed patient's finding of prolonged QT which is present on previous EKGs does appear may be slightly worse today.  She denying any chest pain no palpitations.  Have advised her to avoid trazodone is likely precipitant.  Patient will discuss with her PCP medication alternatives. [PR]    Clinical Course User Index [PR] Merlyn Lot, MD      FINAL CLINICAL IMPRESSION(S) / ED DIAGNOSES   Final diagnoses:  Prolonged QT interval  Generalized abdominal pain     Rx / DC Orders   ED Discharge Orders     None        Note:  This document was prepared using Dragon voice recognition software and may include unintentional dictation errors.    Merlyn Lot, MD 04/12/22 1520

## 2022-04-12 NOTE — Discharge Instructions (Addendum)
STOP taking the Trazodone. Try the Atarax for sleep.  Call [facility] to set up a PCP, OB appointment. [facility] also has a Film/video editor that you can set up an appointment with.  Take ibuprofen 600 mg every 6-8 hours for pain (this can be purchased over-the-counter), or tylenol 1000 mg every 6 hours (no more than 4000 mg daily)   You have been seen in the emergency department for emergency care. It is important that you contact your own doctor, specialist or the closest clinic for follow-up care. Please bring this instruction sheet, all medications and X-ray copies with you when you are seen for follow-up care.  Determining the exact cause for all patients with abdominal pain is extremely difficult in the emergency department. Our primary focus is to rule-out immediate life-threatening diseases. If no immediate source of pain is found the definitive diagnosis frequently needs to be determined over time.Many times your primary care physician can determine the cause by following the symptoms over time. Sometimes, specialist are required such as Gastroenterologists, Gynecologists, Urologists or Surgeons. Please return immediately to the Emergency Department for fever>101, Vomiting or Intractable Pain. You should return to the emergency department or see your primary care provider in 12-24hrs if your pain is no better and sooner if your pain becomes worse.

## 2022-04-15 ENCOUNTER — Telehealth: Payer: Self-pay | Admitting: Family Medicine

## 2022-04-15 NOTE — Telephone Encounter (Signed)
[facility] referring for Uterine fibroids. Paper records. Ok per DJE review. Patient can see any provider for medical management. Voicemail is no set up, unable to leave message

## 2022-04-19 NOTE — Telephone Encounter (Signed)
Voicemail is no set up, unable to leave message

## 2022-04-21 NOTE — Telephone Encounter (Signed)
Pt is schedule with Dr. Amalia Hailey on 6/23 at 9:30.

## 2022-04-22 ENCOUNTER — Encounter: Payer: Self-pay | Admitting: Obstetrics and Gynecology

## 2022-04-22 ENCOUNTER — Ambulatory Visit (INDEPENDENT_AMBULATORY_CARE_PROVIDER_SITE_OTHER): Admitting: Obstetrics and Gynecology

## 2022-04-22 VITALS — BP 118/78 | Ht 65.0 in | Wt 158.8 lb

## 2022-04-22 DIAGNOSIS — R102 Pelvic and perineal pain: Secondary | ICD-10-CM | POA: Diagnosis not present

## 2022-04-22 DIAGNOSIS — Z7689 Persons encountering health services in other specified circumstances: Secondary | ICD-10-CM | POA: Diagnosis not present

## 2022-04-22 DIAGNOSIS — Z8041 Family history of malignant neoplasm of ovary: Secondary | ICD-10-CM

## 2022-04-22 DIAGNOSIS — D219 Benign neoplasm of connective and other soft tissue, unspecified: Secondary | ICD-10-CM | POA: Diagnosis not present

## 2022-04-22 DIAGNOSIS — N72 Inflammatory disease of cervix uteri: Secondary | ICD-10-CM

## 2022-04-22 DIAGNOSIS — B977 Papillomavirus as the cause of diseases classified elsewhere: Secondary | ICD-10-CM

## 2022-04-22 DIAGNOSIS — R87610 Atypical squamous cells of undetermined significance on cytologic smear of cervix (ASC-US): Secondary | ICD-10-CM

## 2022-08-29 ENCOUNTER — Encounter (INDEPENDENT_AMBULATORY_CARE_PROVIDER_SITE_OTHER): Payer: Self-pay

## 2022-09-13 ENCOUNTER — Telehealth: Payer: Self-pay

## 2022-09-13 NOTE — Telephone Encounter (Signed)
Care everywhere 

## 2022-11-25 IMAGING — US US PELVIS COMPLETE TRANSABD/TRANSVAG W DUPLEX AND/OR DOPPLER
1 series · 13 of 25 positions shown · non-contrast
Comparison: None Available.

CLINICAL DATA: Pelvic pain

EXAM:
TRANSABDOMINAL AND TRANSVAGINAL ULTRASOUND OF PELVIS
DOPPLER ULTRASOUND OF OVARIES
TECHNIQUE: Both transabdominal and transvaginal ultrasound examinations of the
pelvis were performed. Transabdominal technique was performed for
global imaging of the pelvis including uterus, ovaries, adnexal
regions, and pelvic cul-de-sac.
It was necessary to proceed with endovaginal exam following the
transabdominal exam to visualize the uterus, ovaries, and adnexa.
Color and duplex Doppler ultrasound was utilized to evaluate blood
flow to the ovaries.

[Series 1: us pelvic complete w transvaginal and torsion righ · 13 of 65 slices shown]
[im 1/65]
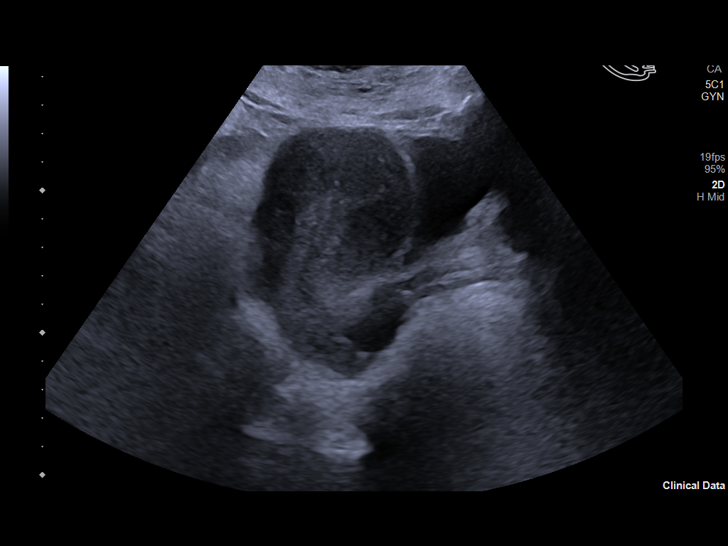
[im 6/65]
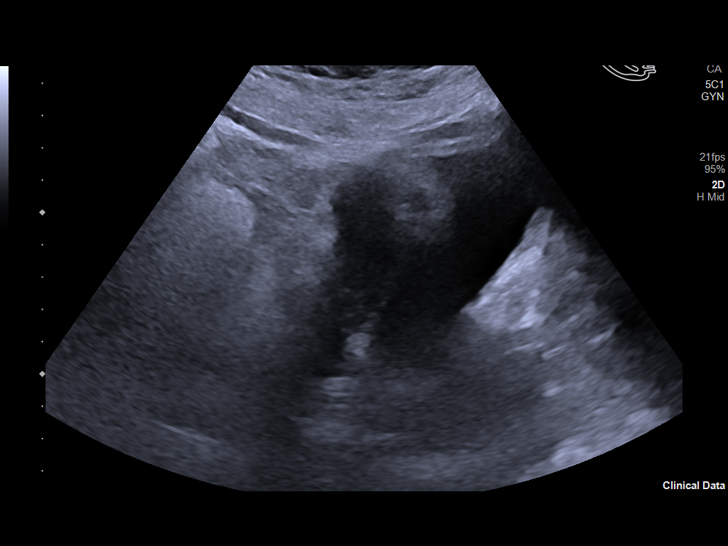
[im 11/65]
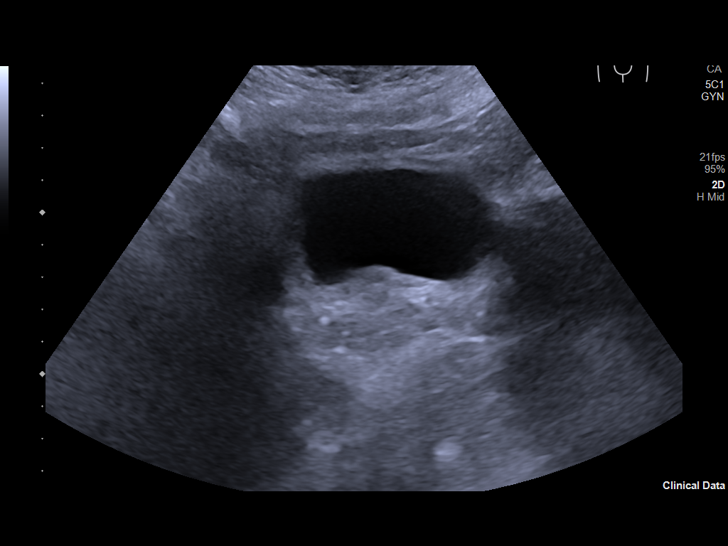
[im 17/65]
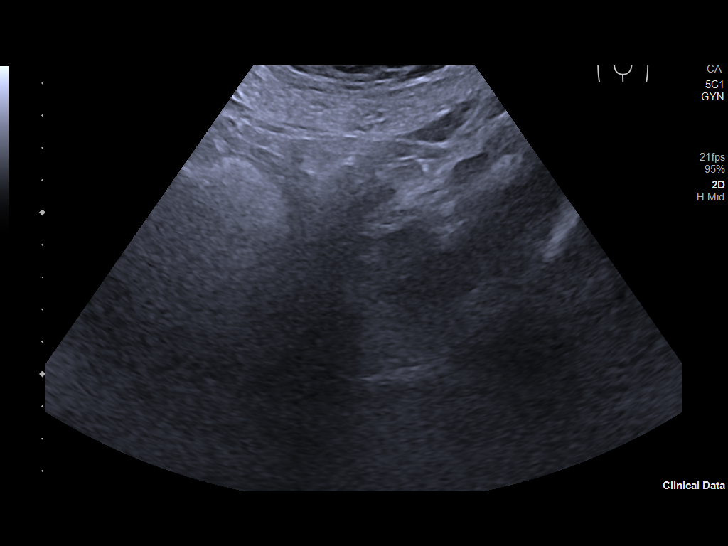
[im 22/65]
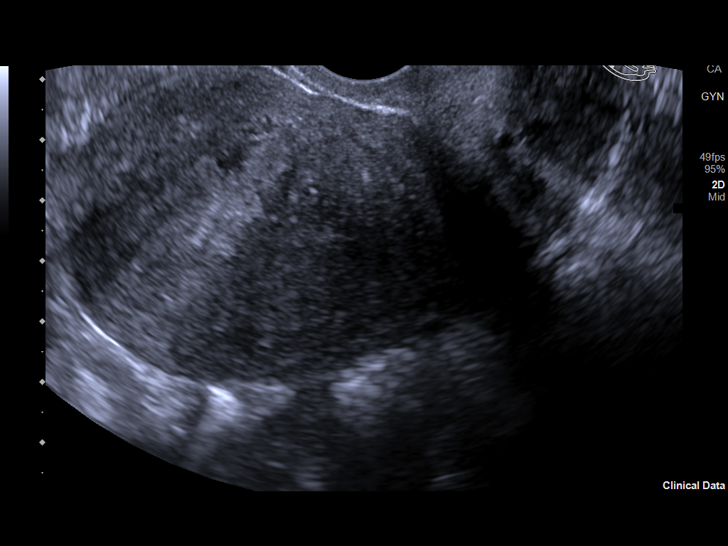
[im 27/65]
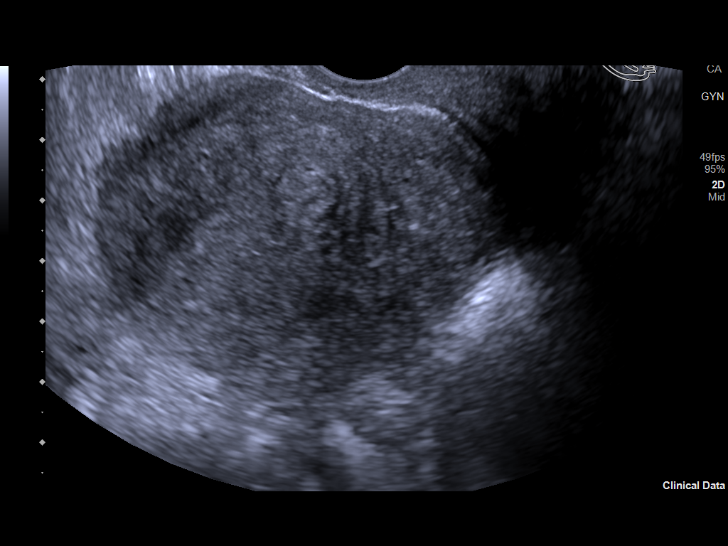
[im 33/65]
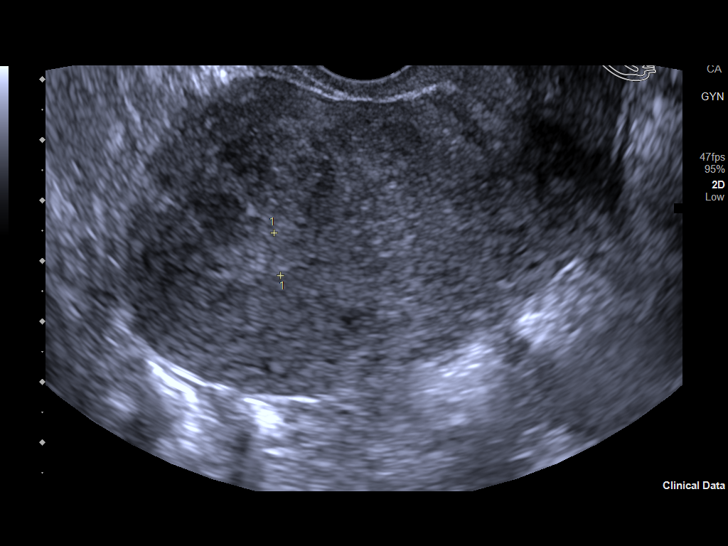
[im 38/65]
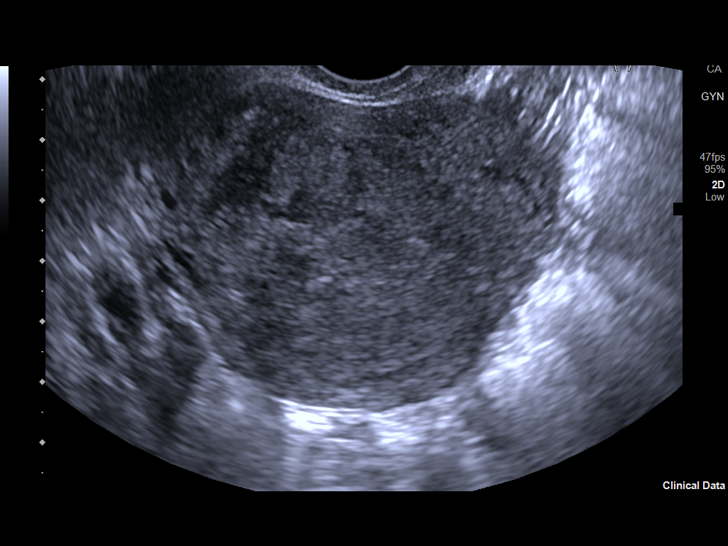
[im 43/65]
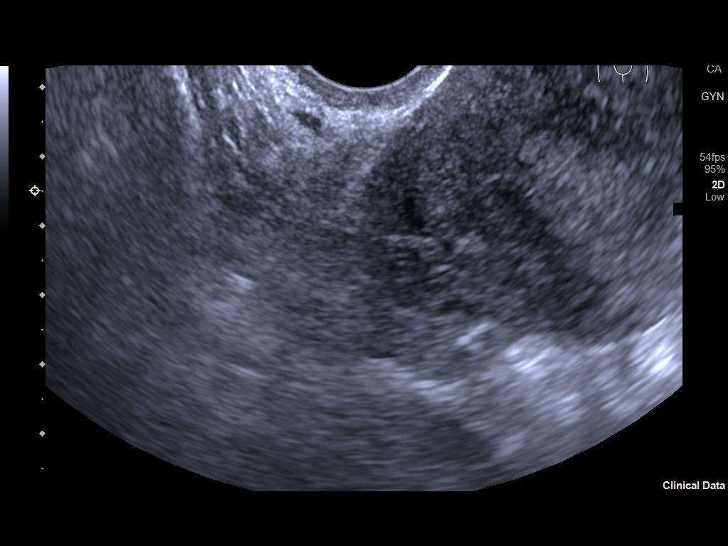
[im 49/65]
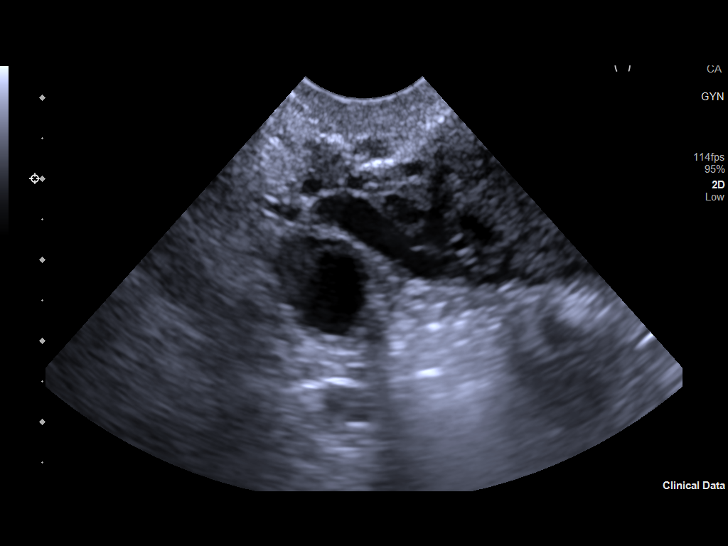
[im 54/65]
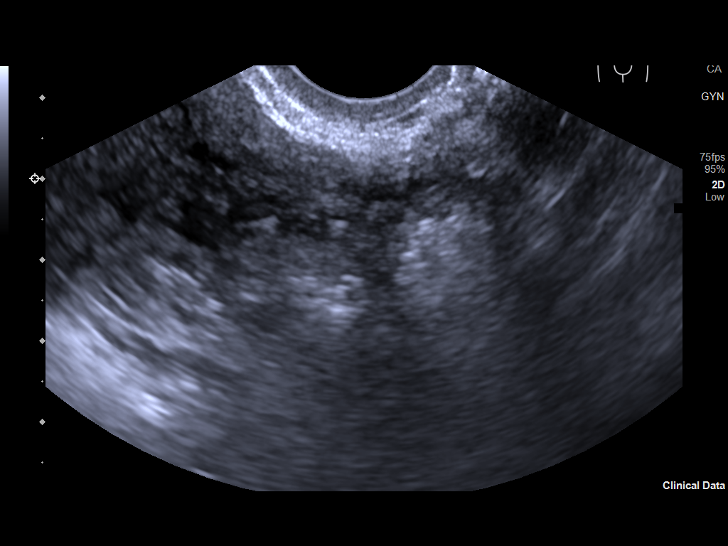
[im 59/65]
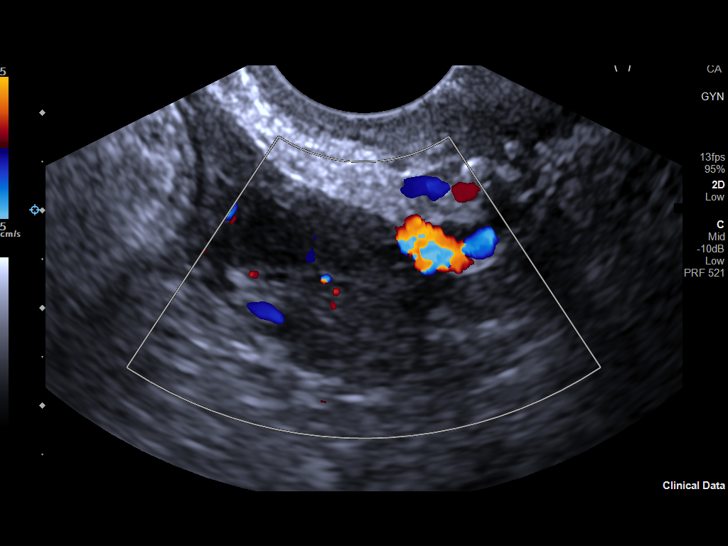
[im 65/65]
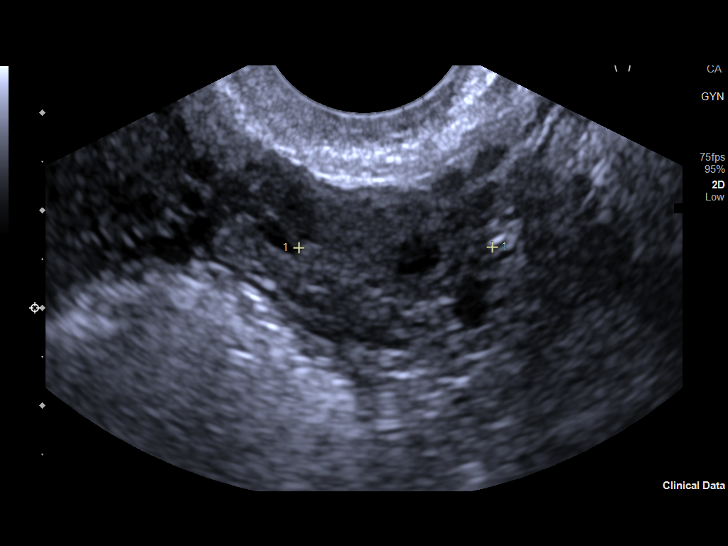

[13 of 25 positions shown; findings below may reference images not displayed]

FINDINGS: Uterus

Measurements: 9.4 x 5.5 x 6.3 cm = volume: 171 mL. Uterine fibroids,
including a right-sided fundal mass of 2.1 x 1.6 x 1.8 cm. A
left-sided anterior fundal lesion measures 1.3 x 1.1 x 0.9 cm.

Endometrium

Thickness: Normal, 7 mm.  No focal abnormality visualized.

Right ovary

Measurements: 2.4 x 1.7 x 1.6 cm = volume: 3.4 mL. Normal
appearance/no adnexal mass.

Left ovary

Measurements: 2.3 x 1.7 x 2.0 cm = volume: 4.0 mL. Normal
appearance/no adnexal mass.

Pulsed Doppler evaluation of both ovaries demonstrates normal
low-resistance arterial and venous waveforms.

Other findings

No abnormal free fluid.
IMPRESSION: Uterine fibroids.  Otherwise, normal pelvic ultrasound.

## 2022-12-08 ENCOUNTER — Ambulatory Visit: Admitting: Family

## 2022-12-13 ENCOUNTER — Ambulatory Visit (INDEPENDENT_AMBULATORY_CARE_PROVIDER_SITE_OTHER): Admitting: Family

## 2022-12-13 ENCOUNTER — Encounter: Payer: Self-pay | Admitting: Family

## 2022-12-13 VITALS — BP 110/58 | HR 72 | Ht 65.0 in | Wt 145.2 lb

## 2022-12-13 DIAGNOSIS — R8290 Unspecified abnormal findings in urine: Secondary | ICD-10-CM | POA: Diagnosis not present

## 2022-12-13 DIAGNOSIS — E860 Dehydration: Secondary | ICD-10-CM

## 2022-12-13 DIAGNOSIS — R42 Dizziness and giddiness: Secondary | ICD-10-CM | POA: Diagnosis not present

## 2022-12-13 DIAGNOSIS — I959 Hypotension, unspecified: Secondary | ICD-10-CM

## 2022-12-13 LAB — POCT URINALYSIS DIPSTICK
Bilirubin, UA: NEGATIVE
Glucose, UA: NEGATIVE
Ketones, UA: NEGATIVE
Nitrite, UA: NEGATIVE
Protein, UA: NEGATIVE
Spec Grav, UA: >=1.03 — AB (ref 1.010–1.025)
Urobilinogen, UA: 0.2 E.U./dL
pH, UA: 5.5 (ref 5.0–8.0)

## 2022-12-13 MED ORDER — VILAZODONE HCL 40 MG PO TABS: 40.0000 mg | ORAL_TABLET | Freq: Every day | ORAL | 1 refills | Status: DC

## 2022-12-13 MED ORDER — SODIUM CHLORIDE 0.9 % IV BOLUS: 500.0000 mL | Freq: Once | INTRAVENOUS | Status: DC

## 2022-12-13 MED ORDER — LORAZEPAM 2 MG PO TABS
2.0000 mg | ORAL_TABLET | Freq: Three times a day (TID) | ORAL | 2 refills | Status: DC | PRN
Start: 2022-12-13 — End: 2022-12-16

## 2022-12-13 MED ORDER — SODIUM CHLORIDE 0.9 % IV BOLUS
500.0000 mL | Freq: Once | INTRAVENOUS | Status: AC
  Administered 2022-12-13: 500 mL via INTRAVENOUS

## 2022-12-13 MED ORDER — AMPHETAMINE-DEXTROAMPHETAMINE 20 MG PO TABS: 20.0000 mg | ORAL_TABLET | Freq: Two times a day (BID) | ORAL | 0 refills | Status: DC

## 2022-12-13 MED ORDER — HYDROCODONE-ACETAMINOPHEN 5-325 MG PO TABS: 1.0000 | ORAL_TABLET | Freq: Four times a day (QID) | ORAL | 0 refills | Status: DC | PRN

## 2022-12-14 ENCOUNTER — Encounter: Payer: Self-pay | Admitting: Family

## 2022-12-14 NOTE — Progress Notes (Signed)
Established Patient Office Visit  Subjective:  Patient ID: Sandra Aguilar, female    DOB: 1976/04/25  Age: 47 y.o. MRN: [id number]  Chief Complaint  Patient presents with   Follow-up    1 month follow up    Dizziness This is a new problem. The current episode started 1 to 4 weeks ago. The problem occurs constantly. The problem has been gradually worsening. Associated symptoms include vertigo and weakness. The symptoms are aggravated by standing, bending, walking and exertion. She has tried eating, position changes, sleep, lying down, rest and drinking for the symptoms. The treatment provided no relief.  Medication Refill This is a recurrent problem. The current episode started more than 1 year ago. Associated symptoms include vertigo and weakness. Nothing aggravates the symptoms.  Anxiety Presents for follow-up visit. Symptoms include decreased concentration, depressed mood, dizziness, excessive worry, hyperventilation, nervous/anxious behavior, palpitations and panic. Symptoms occur most days. The severity of symptoms is severe, causing significant distress and interfering with daily activities. The patient sleeps 6 hours per night. The quality of sleep is poor. Nighttime awakenings: one to two.   Compliance with medications is 76-100%.      Past Medical History:  Diagnosis Date   Depression    Fibromyalgia    Insomnia    Lung nodule    PTSD (post-traumatic stress disorder)    Strep pharyngitis 02/17/2021   Last Assessment & Plan: Formatting of this note might be different from the original. Will not check a rapid strep test for her sore throat due to her husband was positive for strep throat diagnosed yesterday in the same clinic.  Will prescribe Pen-Vee K 500 mg 1 every 12 hours for 10 days.  Be sure to drink plenty of fluids including Gatorade, take Tylenol or Advil for pain or fever as needed.  I   Stroke [facility])    Suspected COVID-19 virus infection 06/29/2020   Formatting  of this note might be different from the original. Tiny presents via virtual visit with complaints of cough with dark yellow and green mucous, nasal congestion and stuffiness, low grade fevers, sore throat, nausea, vomiting, and recent sick exposure. She had a recent negative covid test last week, and started Augmentin for a URI after her negative Covid results.  She states she start   Thyroid nodule     Social History   Socioeconomic History   Marital status: Married    Spouse name: Not on file   Number of children: Not on file   Years of education: Not on file   Highest education level: Not on file  Occupational History   Not on file  Tobacco Use   Smoking status: Never    Passive exposure: Never   Smokeless tobacco: Never  Vaping Use   Vaping Use: Never used  Substance and Sexual Activity   Alcohol use: Not Currently    Comment: ocassionally   Drug use: Never   Sexual activity: Yes    Birth control/protection: None, Other-see comments    Comment: husband has vasectomy  Other Topics Concern   Not on file  Social History Narrative   Not on file   Social Determinants of Health   Financial Resource Strain: Not on file  Food Insecurity: Not on file  Transportation Needs: Not on file  Physical Activity: Not on file  Stress: Not on file  Social Connections: Not on file  Intimate Partner Violence: Not on file    Family History  Problem Relation Age of  Onset   Heart attack Father    Vascular Disease Father    Vascular Disease Maternal Uncle    Obesity Paternal Aunt    Vascular Disease Paternal Aunt    Heart attack Maternal Grandmother    Ovarian cancer Maternal Grandmother    Vascular Disease Maternal Grandfather    Stroke Paternal Grandmother     Allergies  Allergen Reactions   Ciprofloxacin Nausea And Vomiting and Other (See Comments)    Neuropathy  Neuropathy  Neuropathy  Neuropathy  Neuropathy   Nitrofurantoin Hives    Other reaction(s):  Hives/Swelling-Allergy   Trazodone     QT interval too long   Sulfa Antibiotics Nausea And Vomiting, Other (See Comments) and Rash    CAUSES PAIN, PT REPORTS    Review of Systems  Constitutional:  Positive for malaise/fatigue.  Cardiovascular:  Positive for palpitations.  Neurological:  Positive for dizziness, vertigo and weakness.  Psychiatric/Behavioral:  Positive for decreased concentration. The patient is nervous/anxious.        Objective:   BP (!) 110/58   Pulse 72   Ht 5' 5"$  (1.651 m)   Wt 145 lb 3.2 oz (65.9 kg)   SpO2 96%   BMI 24.16 kg/m   Vitals:   12/13/22 1256  BP: (!) 110/58  Pulse: 72  Height: 5' 5"$  (1.651 m)  Weight: 145 lb 3.2 oz (65.9 kg)  SpO2: 96%  BMI (Calculated): 24.16    Physical Exam Constitutional:      Appearance: Normal appearance. She is normal weight. She is ill-appearing.  Eyes:     Pupils: Pupils are equal, round, and reactive to light.  Cardiovascular:     Rate and Rhythm: Normal rate and regular rhythm.  Pulmonary:     Effort: Pulmonary effort is normal.     Breath sounds: Normal breath sounds.  Skin:    General: Skin is warm and dry.  Neurological:     General: No focal deficit present.     Mental Status: She is alert and oriented to person, place, and time.     Gait: Gait abnormal.     Comments: Stumbling, unsteady      Results for orders placed or performed in visit on 12/13/22  POCT urinalysis dipstick  Result Value Ref Range   Color, UA Yellow    Clarity, UA Hazy    Glucose, UA Negative Negative   Bilirubin, UA n    Ketones, UA n    Spec Grav, UA >=1.030 (A) 1.010 - 1.025   Blood, UA Small    pH, UA 5.5 5.0 - 8.0   Protein, UA Negative Negative   Urobilinogen, UA 0.2 0.2 or 1.0 E.U./dL   Nitrite, UA Negative    Leukocytes, UA Small (1+) (A) Negative   Appearance Hazy    Odor      Recent Results (from the past 2160 hour(s))  POCT urinalysis dipstick     Status: Abnormal   Collection Time: 12/13/22  1:46  PM  Result Value Ref Range   Color, UA Yellow    Clarity, UA Hazy    Glucose, UA Negative Negative   Bilirubin, UA n    Ketones, UA n    Spec Grav, UA >=1.030 (A) 1.010 - 1.025   Blood, UA Small    pH, UA 5.5 5.0 - 8.0   Protein, UA Negative Negative   Urobilinogen, UA 0.2 0.2 or 1.0 E.U./dL   Nitrite, UA Negative    Leukocytes, UA Small (1+) (A)  Negative   Appearance Hazy    Odor        Assessment & Plan:   Problem List Items Addressed This Visit   None Visit Diagnoses     Dizziness    -  Primary   Relevant Medications   sodium chloride 0.9 % bolus 500 mL (Completed)   Other Relevant Orders   POCT urinalysis dipstick (Completed)   Hypotension, unspecified hypotension type       Relevant Medications   sodium chloride 0.9 % bolus 500 mL (Completed)   Other Relevant Orders   POCT urinalysis dipstick (Completed)   Dehydration       Relevant Medications   sodium chloride 0.9 % bolus 500 mL (Completed)   Abnormal urine findings       Relevant Medications   sodium chloride 0.9 % bolus 500 mL (Completed)   Other Relevant Orders   Urine Culture   Urinalysis, Routine w reflex microscopic     Fluids given in office today.  UA in office was abnormal, so we are sending for culture.    Follow up in 1 month.  Total time spent: 45 minutes  Mechele Claude, FNP  12/13/2022

## 2022-12-15 LAB — MICROSCOPIC EXAMINATION
Casts: NONE SEEN /lpf
Epithelial Cells (non renal): >10 /hpf — AB (ref 0–10)

## 2022-12-15 LAB — URINALYSIS, ROUTINE W REFLEX MICROSCOPIC
Bilirubin, UA: NEGATIVE
Glucose, UA: NEGATIVE
Ketones, UA: NEGATIVE
Nitrite, UA: NEGATIVE
Protein,UA: NEGATIVE
Specific Gravity, UA: 1.02 (ref 1.005–1.030)
Urobilinogen, Ur: 0.2 mg/dL (ref 0.2–1.0)
pH, UA: 5.5 (ref 5.0–7.5)

## 2022-12-15 LAB — URINE CULTURE: Organism ID, Bacteria: NO GROWTH

## 2022-12-16 ENCOUNTER — Encounter: Payer: Self-pay | Admitting: Family

## 2022-12-16 MED ORDER — LORAZEPAM 2 MG PO TABS
2.0000 mg | ORAL_TABLET | Freq: Three times a day (TID) | ORAL | 2 refills | Status: DC | PRN
Start: 2022-12-16 — End: 2023-02-06

## 2022-12-16 MED ORDER — HYDROCODONE-ACETAMINOPHEN 10-325 MG PO TABS
1.0000 | ORAL_TABLET | Freq: Four times a day (QID) | ORAL | 0 refills | Status: DC | PRN
Start: 2022-12-16 — End: 2022-12-19

## 2022-12-19 MED ORDER — HYDROCODONE-ACETAMINOPHEN 10-325 MG PO TABS: 1.0000 | ORAL_TABLET | Freq: Four times a day (QID) | ORAL | 0 refills | Status: DC | PRN

## 2023-01-17 MED ORDER — AMPHETAMINE-DEXTROAMPHETAMINE 20 MG PO TABS
20.0000 mg | ORAL_TABLET | Freq: Two times a day (BID) | ORAL | 0 refills | Status: DC
Start: 2023-01-17 — End: 2023-02-06

## 2023-01-17 NOTE — Addendum Note (Signed)
Addended by: Georgian Co on: 01/17/2023 10:24 AM   Modules accepted: Orders

## 2023-02-06 ENCOUNTER — Encounter: Payer: Self-pay | Admitting: Family

## 2023-02-06 ENCOUNTER — Ambulatory Visit: Admitting: Family

## 2023-02-06 ENCOUNTER — Ambulatory Visit (INDEPENDENT_AMBULATORY_CARE_PROVIDER_SITE_OTHER): Admitting: Family

## 2023-02-06 VITALS — BP 104/68 | HR 92 | Ht 65.0 in | Wt 140.0 lb

## 2023-02-06 DIAGNOSIS — F900 Attention-deficit hyperactivity disorder, predominantly inattentive type: Secondary | ICD-10-CM

## 2023-02-06 DIAGNOSIS — M5442 Lumbago with sciatica, left side: Secondary | ICD-10-CM | POA: Diagnosis not present

## 2023-02-06 DIAGNOSIS — J301 Allergic rhinitis due to pollen: Secondary | ICD-10-CM

## 2023-02-06 DIAGNOSIS — M5441 Lumbago with sciatica, right side: Secondary | ICD-10-CM

## 2023-02-06 DIAGNOSIS — M797 Fibromyalgia: Secondary | ICD-10-CM | POA: Diagnosis not present

## 2023-02-06 DIAGNOSIS — F411 Generalized anxiety disorder: Secondary | ICD-10-CM

## 2023-02-06 DIAGNOSIS — G8929 Other chronic pain: Secondary | ICD-10-CM

## 2023-02-06 MED ORDER — TIZANIDINE HCL 4 MG PO TABS
4.0000 mg | ORAL_TABLET | Freq: Three times a day (TID) | ORAL | 0 refills | Status: DC
Start: 2023-02-06 — End: 2023-03-07

## 2023-02-06 MED ORDER — HYDROCODONE-ACETAMINOPHEN 10-325 MG PO TABS: 1.0000 | ORAL_TABLET | Freq: Four times a day (QID) | ORAL | 0 refills | Status: DC | PRN

## 2023-02-06 MED ORDER — AMPHETAMINE-DEXTROAMPHETAMINE 20 MG PO TABS: 20.0000 mg | ORAL_TABLET | Freq: Two times a day (BID) | ORAL | 0 refills | Status: DC

## 2023-02-06 MED ORDER — GABAPENTIN 600 MG PO TABS: 600.0000 mg | ORAL_TABLET | Freq: Three times a day (TID) | ORAL | 1 refills | Status: DC

## 2023-02-06 MED ORDER — LORAZEPAM 2 MG PO TABS
2.0000 mg | ORAL_TABLET | Freq: Three times a day (TID) | ORAL | 2 refills | Status: DC | PRN
Start: 2023-02-06 — End: 2023-03-07

## 2023-02-06 MED ORDER — FEXOFENADINE HCL 180 MG PO TABS
180.0000 mg | ORAL_TABLET | Freq: Every day | ORAL | 1 refills | Status: DC
Start: 2023-02-06 — End: 2023-03-07

## 2023-02-06 MED ORDER — KETOTIFEN FUMARATE 0.035 % OP SOLN
1.0000 [drp] | Freq: Two times a day (BID) | OPHTHALMIC | 1 refills | Status: DC
Start: 2023-02-06 — End: 2023-06-02

## 2023-02-07 ENCOUNTER — Encounter: Payer: Self-pay | Admitting: Family

## 2023-02-07 DIAGNOSIS — J301 Allergic rhinitis due to pollen: Secondary | ICD-10-CM | POA: Insufficient documentation

## 2023-02-07 DIAGNOSIS — F900 Attention-deficit hyperactivity disorder, predominantly inattentive type: Secondary | ICD-10-CM | POA: Insufficient documentation

## 2023-02-07 NOTE — Progress Notes (Signed)
Established Patient Office Visit  Subjective:  Patient ID: Sandra Aguilar, female    DOB: 04-21-1976  Age: 47 y.o. MRN: [id number]  Chief Complaint  Patient presents with   Follow-up    Medication refills    Patient is here today for her 1 month follow up.  She has been feeling well since last appointment.   She does not have additional concerns to discuss today.  Labs are not due today. She needs refills.   I have reviewed her active problem list, medication list, allergies, notes from last encounter, lab results for her appointment today.  No other concerns at this time.   Past Medical History:  Diagnosis Date   Depression    Fibromyalgia    Insomnia    Lung nodule    PTSD (post-traumatic stress disorder)    Strep pharyngitis 02/17/2021   Last Assessment & Plan: Formatting of this note might be different from the original. Will not check a rapid strep test for her sore throat due to her husband was positive for strep throat diagnosed yesterday in the same clinic.  Will prescribe Pen-Vee K 500 mg 1 every 12 hours for 10 days.  Be sure to drink plenty of fluids including Gatorade, take Tylenol or Advil for pain or fever as needed.  I   Stroke    Suspected COVID-19 virus infection 06/29/2020   Formatting of this note might be different from the original. Nazifa presents via virtual visit with complaints of cough with dark yellow and green mucous, nasal congestion and stuffiness, low grade fevers, sore throat, nausea, vomiting, and recent sick exposure. She had a recent negative covid test last week, and started Augmentin for a URI after her negative Covid results.  She states she start   Thyroid nodule     Past Surgical History:  Procedure Laterality Date   APPENDECTOMY     CHOLECYSTECTOMY      Social History   Socioeconomic History   Marital status: Legally Separated    Spouse name: Not on file   Number of children: Not on file   Years of education: Not on file    Highest education level: Not on file  Occupational History   Not on file  Tobacco Use   Smoking status: Never    Passive exposure: Never   Smokeless tobacco: Never  Vaping Use   Vaping Use: Never used  Substance and Sexual Activity   Alcohol use: Not Currently    Comment: ocassionally   Drug use: Never   Sexual activity: Yes    Birth control/protection: None, Other-see comments    Comment: husband has vasectomy  Other Topics Concern   Not on file  Social History Narrative   Not on file   Social Determinants of Health   Financial Resource Strain: Not on file  Food Insecurity: Not on file  Transportation Needs: Not on file  Physical Activity: Not on file  Stress: Not on file  Social Connections: Not on file  Intimate Partner Violence: Not on file    Family History  Problem Relation Age of Onset   Heart attack Father    Vascular Disease Father    Vascular Disease Maternal Uncle    Obesity Paternal Aunt    Vascular Disease Paternal Aunt    Heart attack Maternal Grandmother    Ovarian cancer Maternal Grandmother    Vascular Disease Maternal Grandfather    Stroke Paternal Grandmother     Allergies  Allergen Reactions  Ciprofloxacin Nausea And Vomiting and Other (See Comments)    Neuropathy  Neuropathy  Neuropathy  Neuropathy  Neuropathy   Nitrofurantoin Hives    Other reaction(s): Hives/Swelling-Allergy   Trazodone     QT interval too long   Sulfa Antibiotics Nausea And Vomiting, Other (See Comments) and Rash    CAUSES PAIN, PT REPORTS    Review of Systems  Constitutional:  Positive for malaise/fatigue.  HENT:  Positive for congestion.        Sneezing  Musculoskeletal:  Positive for back pain and myalgias.  All other systems reviewed and are negative.      Objective:   BP 104/68   Pulse 92   Ht 5\' 5"  (1.651 m)   Wt 140 lb (63.5 kg)   SpO2 96%   BMI 23.30 kg/m   Vitals:   02/06/23 1524  BP: 104/68  Pulse: 92  Height: 5\' 5"  (1.651 m)   Weight: 140 lb (63.5 kg)  SpO2: 96%  BMI (Calculated): 23.3    Physical Exam Vitals and nursing note reviewed.  Constitutional:      Appearance: Normal appearance. She is normal weight.  HENT:     Head: Normocephalic.     Nose: Congestion and rhinorrhea present.  Eyes:     Extraocular Movements: Extraocular movements intact.     Conjunctiva/sclera: Conjunctivae normal.     Pupils: Pupils are equal, round, and reactive to light.  Cardiovascular:     Rate and Rhythm: Normal rate.  Pulmonary:     Effort: Pulmonary effort is normal.  Neurological:     General: No focal deficit present.     Mental Status: She is alert and oriented to person, place, and time.  Psychiatric:        Attention and Perception: Attention normal.        Mood and Affect: Mood and affect normal.        Speech: Speech normal.        Behavior: Behavior normal. Behavior is cooperative.        Thought Content: Thought content normal.        Cognition and Memory: Cognition and memory normal.        Judgment: Judgment normal.      No results found for any visits on 02/06/23.    Assessment & Plan:   Problem List Items Addressed This Visit     Fibromyalgia - Primary    Discussed the changes regarding prescribing controlled medications with patient. She verbalizes understanding and has signed the paperwork. We will get her set up with the pain clinic to help manage her pain issues in the meantime      Relevant Medications   HYDROcodone-acetaminophen (NORCO) 10-325 MG tablet   gabapentin (NEURONTIN) 600 MG tablet   tiZANidine (ZANAFLEX) 4 MG tablet   Other Relevant Orders   Ambulatory referral to Pain Clinic   Chronic bilateral low back pain with bilateral sciatica   Relevant Medications   HYDROcodone-acetaminophen (NORCO) 10-325 MG tablet   amphetamine-dextroamphetamine (ADDERALL) 20 MG tablet   LORazepam (ATIVAN) 2 MG tablet   gabapentin (NEURONTIN) 600 MG tablet   tiZANidine (ZANAFLEX) 4 MG  tablet   Other Relevant Orders   Ambulatory referral to Pain Clinic   Attention deficit hyperactivity disorder (ADHD), inattentive type, moderate   Relevant Medications   amphetamine-dextroamphetamine (ADDERALL) 20 MG tablet   Seasonal allergic rhinitis due to pollen    Sending Allegra and eyedrops for patient. She will let me know if  these are effective or not for her symptoms.      Relevant Medications   fexofenadine (ALLEGRA) 180 MG tablet   ketotifen (ZADITOR) 0.035 % ophthalmic solution   Generalized anxiety disorder   Relevant Medications   LORazepam (ATIVAN) 2 MG tablet    Return in about 1 month (around 03/08/2023) for F/U.   Total time spent: 30 minutes  Miki KinsMANDA M Lovie Zarling, FNP  02/06/2023

## 2023-02-07 NOTE — Assessment & Plan Note (Signed)
Discussed the changes regarding prescribing controlled medications with patient. She verbalizes understanding and has signed the paperwork. We will get her set up with the pain clinic to help manage her pain issues in the meantime

## 2023-02-07 NOTE — Assessment & Plan Note (Signed)
Sending Allegra and eyedrops for patient. She will let me know if these are effective or not for her symptoms.

## 2023-02-13 ENCOUNTER — Emergency Department
Admission: EM | Admit: 2023-02-13 | Discharge: 2023-02-13 | Disposition: A | Discharge status: Home / Self Care | Payer: BC Managed Care – PPO | Attending: Emergency Medicine | Admitting: Emergency Medicine

## 2023-02-13 ENCOUNTER — Other Ambulatory Visit: Payer: Self-pay

## 2023-02-13 ENCOUNTER — Emergency Department: Payer: BC Managed Care – PPO

## 2023-02-13 ENCOUNTER — Encounter: Payer: Self-pay | Admitting: *Deleted

## 2023-02-13 DIAGNOSIS — R112 Nausea with vomiting, unspecified: Secondary | ICD-10-CM

## 2023-02-13 DIAGNOSIS — K529 Noninfective gastroenteritis and colitis, unspecified: Secondary | ICD-10-CM

## 2023-02-13 LAB — LIPASE, BLOOD: Lipase: 31 U/L (ref 11–51)

## 2023-02-13 LAB — CBC
HCT: 43.5 % (ref 36.0–46.0)
Hemoglobin: 14.2 g/dL (ref 12.0–15.0)
MCH: 30.3 pg (ref 26.0–34.0)
MCHC: 32.6 g/dL (ref 30.0–36.0)
MCV: 92.8 fL (ref 80.0–100.0)
Platelets: 253 10*3/uL (ref 150–400)
RBC: 4.69 MIL/uL (ref 3.87–5.11)
RDW: 12.2 % (ref 11.5–15.5)
WBC: 13.9 10*3/uL — ABNORMAL HIGH (ref 4.0–10.5)
nRBC: 0 % (ref 0.0–0.2)

## 2023-02-13 LAB — COMPREHENSIVE METABOLIC PANEL
ALT: 12 U/L (ref 0–44)
AST: 19 U/L (ref 15–41)
Albumin: 4.4 g/dL (ref 3.5–5.0)
Alkaline Phosphatase: 44 U/L (ref 38–126)
Anion gap: 10 (ref 5–15)
BUN: 13 mg/dL (ref 6–20)
CO2: 22 mmol/L (ref 22–32)
Calcium: 10.2 mg/dL (ref 8.9–10.3)
Chloride: 103 mmol/L (ref 98–111)
Creatinine, Ser: 0.78 mg/dL (ref 0.44–1.00)
GFR, Estimated: >60 mL/min (ref >60)
Glucose, Bld: 116 mg/dL — ABNORMAL HIGH (ref 70–99)
Potassium: 3.9 mmol/L (ref 3.5–5.1)
Sodium: 135 mmol/L (ref 135–145)
Total Bilirubin: 0.6 mg/dL (ref 0.3–1.2)
Total Protein: 8.6 g/dL — ABNORMAL HIGH (ref 6.5–8.1)

## 2023-02-13 LAB — URINALYSIS, ROUTINE W REFLEX MICROSCOPIC
Bacteria, UA: NONE SEEN
Bilirubin Urine: NEGATIVE
Glucose, UA: NEGATIVE mg/dL
Ketones, ur: NEGATIVE mg/dL
Nitrite: NEGATIVE
Protein, ur: 30 mg/dL — AB
Specific Gravity, Urine: 1.025 (ref 1.005–1.030)
pH: 5 (ref 5.0–8.0)

## 2023-02-13 LAB — POC URINE PREG, ED: Preg Test, Ur: NEGATIVE

## 2023-02-13 MED ORDER — ONDANSETRON 8 MG PO TBDP: 8.0000 mg | ORAL_TABLET | Freq: Three times a day (TID) | ORAL | 0 refills | Status: DC | PRN

## 2023-02-13 MED ORDER — ONDANSETRON 4 MG PO TBDP
8.0000 mg | ORAL_TABLET | Freq: Once | ORAL | Status: AC
  Administered 2023-02-13: 8 mg via ORAL
  Filled 2023-02-13: qty 2

## 2023-02-13 NOTE — ED Triage Notes (Signed)
Pt reports abd pain and emesis for 1 day.  Pt states she was constipated and took an enema.  The enema helped but now she is vomiting.  Pt alert.

## 2023-02-13 NOTE — ED Provider Notes (Signed)
[facility] Provider Note   Event Date/Time   First MD Initiated Contact with Patient 02/13/23 1905     (approximate) History  Abdominal Pain and Emesis  HPI Sandra Aguilar is a 47 y.o. female with stated past medical history of GERD and thyroid mass who presents complaining of of constipation over the last few days until she used an enema today and began having acute nausea/vomiting for the last 5 hours.  Patient states that she has been unable to tolerate p.o. over this time.  Patient denies any diarrhea, recent travel, or sick contacts.  Patient endorses generalized 5/10, aching abdominal pain that she states is cramping in nature and generally improves with vomiting ROS: Patient currently denies any vision changes, tinnitus, difficulty speaking, facial droop, sore throat, chest pain, shortness of breath, dysuria, or weakness/numbness/paresthesias in any extremity   Physical Exam  Triage Vital Signs: ED Triage Vitals  Enc Vitals Group     BP 02/13/23 1819 117/85     Pulse Rate 02/13/23 1819 98     Resp 02/13/23 1819 18     Temp 02/13/23 1819 98 F (36.7 C)     Temp Source 02/13/23 1819 Oral     SpO2 02/13/23 1819 99 %     Weight 02/13/23 1818 140 lb (63.5 kg)     Height 02/13/23 1818 5\' 5"  (1.651 m)     Head Circumference --      Peak Flow --      Pain Score 02/13/23 1817 5     Pain Loc --      Pain Edu? --      Excl. in GC? --    Most recent vital signs: Vitals:   02/13/23 1819  BP: 117/85  Pulse: 98  Resp: 18  Temp: 98 F (36.7 C)  SpO2: 99%   General: Awake, oriented x4. CV:  Good peripheral perfusion. Resp:  Normal effort. Abd:  No distention. Other:  Middle-aged Caucasian female laying in bed in mild distress secondary to nausea ED Results / Procedures / Treatments  Labs (all labs ordered are listed, but only abnormal results are displayed) Labs Reviewed  COMPREHENSIVE METABOLIC PANEL - Abnormal; Notable for the following  components:      Result Value   Glucose, Bld 116 (*)    Total Protein 8.6 (*)    All other components within normal limits  CBC - Abnormal; Notable for the following components:   WBC 13.9 (*)    All other components within normal limits  URINALYSIS, ROUTINE W REFLEX MICROSCOPIC - Abnormal; Notable for the following components:   Color, Urine YELLOW (*)    APPearance HAZY (*)    Hgb urine dipstick MODERATE (*)    Protein, ur 30 (*)    Leukocytes,Ua TRACE (*)    All other components within normal limits  POC URINE PREG, ED - Normal  LIPASE, BLOOD  RADIOLOGY ED MD interpretation: CT of the abdomen and pelvis without IV contrast shows no renal stones or obstructive uropathy however there is diffusely fluid-filled bowel, involving stomach, small bowel, and colon there may be minimal bowel wall thickening.  Overall findings suspicious for gastroenteritis/diarrheal process -Agree with radiology assessment Official radiology report(s): CT Renal Stone Study  Result Date: 02/13/2023 CLINICAL DATA:  Abdominal/flank pain, stone suspected EXAM: CT ABDOMEN AND PELVIS WITHOUT CONTRAST TECHNIQUE: Multidetector CT imaging of the abdomen and pelvis was performed following the standard protocol without IV contrast. RADIATION DOSE REDUCTION: This exam was  performed according to the departmental dose-optimization program which includes automated exposure control, adjustment of the mA and/or kV according to patient size and/or use of iterative reconstruction technique. COMPARISON:  Pelvic ultrasound 04/12/2022 FINDINGS: Lower chest: No basilar airspace disease or pleural effusion. Hepatobiliary: No focal liver abnormality is seen. Status post cholecystectomy. No biliary dilatation. Pancreas: No ductal dilatation or inflammation. Spleen: Normal in size. Single calcification at the hilum. No evidence of focal splenic abnormality. Adrenals/Urinary Tract: Normal adrenal glands. No hydronephrosis, renal calculi or  perinephric edema. Urinary bladder is near completely empty and not well assessed. There is no bladder stone. Stomach/Bowel: Small hiatal hernia. Mild fluid distention of the stomach, no gastric wall thickening. Mild fluid-filled small bowel but no abnormal distension or obstruction. There may be minimal wall thickening in left abdominal small bowel loops, for example series 2, image 49. Fluid/liquid stool throughout the colon. There is no colonic inflammatory change. The appendix is not confidently visualized. No appendicitis or pericecal inflammation. Vascular/Lymphatic: Normal caliber abdominal aorta. No bulky abdominopelvic adenopathy. Reproductive: Heterogeneous uterus, fibroids were demonstrated on prior pelvic ultrasound. The ovaries are not discretely visualized, there is no suspicious adnexal mass. Other: No free air. No abdominopelvic ascites. No abdominal wall hernia. Multiple pelvic phleboliths. Musculoskeletal: There are no acute or suspicious osseous abnormalities. IMPRESSION: 1. No renal stones or obstructive uropathy. 2. Diffusely fluid-filled bowel, involving the stomach, small bowel and colon. There may be minimal small bowel wall thickening. Overall findings are suspicious for gastroenteritis/diarrheal process. 3. Small hiatal hernia. 4. Uterine fibroids. Electronically Signed   By: Narda Rutherford M.D.   On: 02/13/2023 19:40   PROCEDURES: Critical Care performed: No Procedures MEDICATIONS ORDERED IN ED: Medications  ondansetron (ZOFRAN-ODT) disintegrating tablet 8 mg (8 mg Oral Given 02/13/23 1913)   IMPRESSION / MDM / ASSESSMENT AND PLAN / ED COURSE  I reviewed the triage vital signs and the nursing notes.                             Patient's presentation is most consistent with acute presentation with potential threat to life or bodily function. Patient presents for acute nausea/vomiting The cause of the patients symptoms is not clear, but the patient is overall well appearing  and is suspected to have a transient course of illness.  Given History and Exam there does not appear to be an emergent cause of the symptoms such as small bowel obstruction, coronary syndrome, bowel ischemia, DKA, pancreatitis, appendicitis, other acute abdomen or other emergent problem.  Reassessment: After treatment, the patient is feeling much better, tolerating PO fluids, and shows no signs of dehydration.   Disposition: Discharge home with prompt primary care physician follow up in the next 48 hours. Strict return precautions discussed.   FINAL CLINICAL IMPRESSION(S) / ED DIAGNOSES   Final diagnoses:  Nausea and vomiting, unspecified vomiting type  Gastroenteritis   Rx / DC Orders   ED Discharge Orders          Ordered    ondansetron (ZOFRAN-ODT) 8 MG disintegrating tablet  Every 8 hours PRN        02/13/23 1950           Note:  This document was prepared using Dragon voice recognition software and may include unintentional dictation errors.   Merwyn Katos, MD 02/13/23 [phone]

## 2023-02-13 NOTE — ED Notes (Signed)
Pt discharge to home. Pt VSS, GCS 15, NAD. Pt verbalized understanding of discharge instructions with no additional questions at this time.  

## 2023-02-21 ENCOUNTER — Encounter: Payer: Self-pay | Admitting: Family

## 2023-02-21 DIAGNOSIS — G8929 Other chronic pain: Secondary | ICD-10-CM

## 2023-03-07 ENCOUNTER — Ambulatory Visit (INDEPENDENT_AMBULATORY_CARE_PROVIDER_SITE_OTHER): Admitting: Family

## 2023-03-07 DIAGNOSIS — M5442 Lumbago with sciatica, left side: Secondary | ICD-10-CM

## 2023-03-07 DIAGNOSIS — F411 Generalized anxiety disorder: Secondary | ICD-10-CM | POA: Diagnosis not present

## 2023-03-07 DIAGNOSIS — F900 Attention-deficit hyperactivity disorder, predominantly inattentive type: Secondary | ICD-10-CM | POA: Diagnosis not present

## 2023-03-07 DIAGNOSIS — M797 Fibromyalgia: Secondary | ICD-10-CM | POA: Diagnosis not present

## 2023-03-07 DIAGNOSIS — M5441 Lumbago with sciatica, right side: Secondary | ICD-10-CM

## 2023-03-07 DIAGNOSIS — G8929 Other chronic pain: Secondary | ICD-10-CM

## 2023-03-07 DIAGNOSIS — J301 Allergic rhinitis due to pollen: Secondary | ICD-10-CM

## 2023-03-07 DIAGNOSIS — F331 Major depressive disorder, recurrent, moderate: Secondary | ICD-10-CM

## 2023-03-07 MED ORDER — TRAZODONE HCL 300 MG PO TABS
300.0000 mg | ORAL_TABLET | Freq: Every day | ORAL | 1 refills | Status: DC
Start: 2023-03-07 — End: 2023-06-02

## 2023-03-07 MED ORDER — AMPHETAMINE-DEXTROAMPHETAMINE 20 MG PO TABS: 20.0000 mg | ORAL_TABLET | Freq: Two times a day (BID) | ORAL | 0 refills | Status: DC

## 2023-03-07 MED ORDER — TIZANIDINE HCL 4 MG PO TABS
4.0000 mg | ORAL_TABLET | Freq: Three times a day (TID) | ORAL | 0 refills | Status: DC
Start: 2023-03-07 — End: 2023-04-13

## 2023-03-07 MED ORDER — FEXOFENADINE HCL 180 MG PO TABS: 180.0000 mg | ORAL_TABLET | Freq: Every day | ORAL | 1 refills | Status: DC

## 2023-03-07 MED ORDER — LORAZEPAM 2 MG PO TABS
2.0000 mg | ORAL_TABLET | Freq: Three times a day (TID) | ORAL | 2 refills | Status: DC | PRN
Start: 2023-03-07 — End: 2023-04-13

## 2023-03-07 MED ORDER — HYDROCODONE-ACETAMINOPHEN 10-325 MG PO TABS
1.0000 | ORAL_TABLET | Freq: Four times a day (QID) | ORAL | 0 refills | Status: DC | PRN
Start: 2023-03-07 — End: 2023-04-10

## 2023-03-07 MED ORDER — GABAPENTIN 600 MG PO TABS: 600.0000 mg | ORAL_TABLET | Freq: Three times a day (TID) | ORAL | 1 refills | Status: DC

## 2023-03-07 NOTE — Progress Notes (Unsigned)
Established Patient Office Visit  Subjective:  Patient ID: Sandra Aguilar, female    DOB: 05/22/1976  Age: 47 y.o. MRN: [id number]  Chief Complaint  Patient presents with   Follow-up    1 month follow up    Patient is here today for her 1 month follow up.  She has been feeling fairly well since last appointment.   She does not have additional concerns to discuss today.  Still needs referral to pain medicine.  Labs are not due today. She needs refills.   I have reviewed her active problem list, medication list, allergies, notes from last encounter, lab results, and PDMP for her appointment today.      No other concerns at this time.   Past Medical History:  Diagnosis Date   Depression    Family history of breast cancer 05/27/2018   Fibromyalgia    Insomnia    Lung nodule    Lung nodule, solitary 09/04/2020   PTSD (post-traumatic stress disorder)    Strep pharyngitis 02/17/2021   Last Assessment & Plan: Formatting of this note might be different from the original. Will not check a rapid strep test for her sore throat due to her husband was positive for strep throat diagnosed yesterday in the same clinic.  Will prescribe Pen-Vee K 500 mg 1 every 12 hours for 10 days.  Be sure to drink plenty of fluids including Gatorade, take Tylenol or Advil for pain or fever as needed.  I   Stroke [facility])    Suspected COVID-19 virus infection 06/29/2020   Formatting of this note might be different from the original. Maalle presents via virtual visit with complaints of cough with dark yellow and green mucous, nasal congestion and stuffiness, low grade fevers, sore throat, nausea, vomiting, and recent sick exposure. She had a recent negative covid test last week, and started Augmentin for a URI after her negative Covid results.  She states she start   Thyroid nodule     Past Surgical History:  Procedure Laterality Date   APPENDECTOMY     CHOLECYSTECTOMY      Social History    Socioeconomic History   Marital status: Legally Separated    Spouse name: Not on file   Number of children: Not on file   Years of education: Not on file   Highest education level: Not on file  Occupational History   Not on file  Tobacco Use   Smoking status: Never    Passive exposure: Never   Smokeless tobacco: Never  Vaping Use   Vaping Use: Never used  Substance and Sexual Activity   Alcohol use: Not Currently    Comment: ocassionally   Drug use: Never   Sexual activity: Yes    Birth control/protection: None, Other-see comments    Comment: husband has vasectomy  Other Topics Concern   Not on file  Social History Narrative   Not on file   Social Determinants of Health   Financial Resource Strain: Not on file  Food Insecurity: Not on file  Transportation Needs: Not on file  Physical Activity: Not on file  Stress: Not on file  Social Connections: Not on file  Intimate Partner Violence: Not on file    Family History  Problem Relation Age of Onset   Heart attack Father    Vascular Disease Father    Vascular Disease Maternal Uncle    Obesity Paternal Aunt    Vascular Disease Paternal Aunt    Heart attack Maternal  Grandmother    Ovarian cancer Maternal Grandmother    Vascular Disease Maternal Grandfather    Stroke Paternal Grandmother     Allergies  Allergen Reactions   Ciprofloxacin Nausea And Vomiting and Other (See Comments)    Neuropathy  Neuropathy  Neuropathy  Neuropathy  Neuropathy   Nitrofurantoin Hives    Other reaction(s): Hives/Swelling-Allergy   Promethazine Other (See Comments)    "Childhood," pt stated she has tolerated since   Trazodone     QT interval too long   Sulfa Antibiotics Nausea And Vomiting, Other (See Comments) and Rash    CAUSES PAIN, PT REPORTS    Review of Systems  Constitutional:  Positive for malaise/fatigue.  Musculoskeletal:  Positive for back pain, joint pain, myalgias and neck pain.  All other systems reviewed  and are negative.      Objective:   BP 120/64   Pulse (!) 59   Ht 5\' 5"  (1.651 m)   Wt 136 lb (61.7 kg)   LMP 01/25/2023 (Approximate)   SpO2 95%   BMI 22.63 kg/m   Vitals:   03/07/23 1253  BP: 120/64  Pulse: (!) 59  Height: 5\' 5"  (1.651 m)  Weight: 136 lb (61.7 kg)  SpO2: 95%  BMI (Calculated): 22.63    Physical Exam Vitals and nursing note reviewed.  Constitutional:      Appearance: Normal appearance. She is normal weight.  HENT:     Head: Normocephalic.  Eyes:     Pupils: Pupils are equal, round, and reactive to light.  Cardiovascular:     Rate and Rhythm: Normal rate.  Pulmonary:     Effort: Pulmonary effort is normal.  Musculoskeletal:     Lumbar back: Spasms, tenderness and bony tenderness present.     Right lower leg: Tenderness present.     Left lower leg: Tenderness present.  Neurological:     General: No focal deficit present.     Mental Status: She is alert and oriented to person, place, and time.  Psychiatric:        Mood and Affect: Mood normal.        Behavior: Behavior normal.        Thought Content: Thought content normal.        Judgment: Judgment normal.      No results found for any visits on 03/07/23.  Recent Results (from the past 2160 hour(s))  POCT urinalysis dipstick     Status: Abnormal   Collection Time: 12/13/22  1:46 PM  Result Value Ref Range   Color, UA Yellow    Clarity, UA Hazy    Glucose, UA Negative Negative   Bilirubin, UA n    Ketones, UA n    Spec Grav, UA >=1.030 (A) 1.010 - 1.025   Blood, UA Small    pH, UA 5.5 5.0 - 8.0   Protein, UA Negative Negative   Urobilinogen, UA 0.2 0.2 or 1.0 E.U./dL   Nitrite, UA Negative    Leukocytes, UA Small (1+) (A) Negative   Appearance Hazy    Odor    Urine Culture     Status: None   Collection Time: 12/13/22  3:43 PM   Specimen: Urine, Clean Catch   UC  Result Value Ref Range   Urine Culture, Routine Final report    Organism ID, Bacteria No growth   Urinalysis,  Routine w reflex microscopic     Status: Abnormal   Collection Time: 12/13/22  3:43 PM  Result Value Ref  Range   Specific Gravity, UA 1.020 1.005 - 1.030   pH, UA 5.5 5.0 - 7.5   Color, UA Yellow Yellow   Appearance Ur Clear Clear   Leukocytes,UA 1+ (A) Negative   Protein,UA Negative Negative/Trace   Glucose, UA Negative Negative   Ketones, UA Negative Negative   RBC, UA 1+ (A) Negative   Bilirubin, UA Negative Negative   Urobilinogen, Ur 0.2 0.2 - 1.0 mg/dL   Nitrite, UA Negative Negative   Microscopic Examination See below:     Comment: Microscopic was indicated and was performed.  Microscopic Examination     Status: Abnormal   Collection Time: 12/13/22  3:43 PM  Result Value Ref Range   WBC, UA 11-30 (A) 0 - 5 /hpf   RBC, Urine 3-10 (A) 0 - 2 /hpf   Epithelial Cells (non renal) >10 (A) 0 - 10 /hpf   Casts None seen None seen /lpf   Bacteria, UA Few None seen/Few  Lipase, blood     Status: None   Collection Time: 02/13/23  6:19 PM  Result Value Ref Range   Lipase 31 11 - 51 U/L    Comment: Performed at [facility], [address] [place], [place] [address]  Comprehensive metabolic panel     Status: Abnormal   Collection Time: 02/13/23  6:19 PM  Result Value Ref Range   Sodium 135 135 - 145 mmol/L   Potassium 3.9 3.5 - 5.1 mmol/L   Chloride 103 98 - 111 mmol/L   CO2 22 22 - 32 mmol/L   Glucose, Bld 116 (H) 70 - 99 mg/dL    Comment: Glucose reference range applies only to samples taken after fasting for at least 8 hours.   BUN 13 6 - 20 mg/dL   Creatinine, Ser 4.69 0.44 - 1.00 mg/dL   Calcium 62.9 8.9 - 52.8 mg/dL   Total Protein 8.6 (H) 6.5 - 8.1 g/dL   Albumin 4.4 3.5 - 5.0 g/dL   AST 19 15 - 41 U/L   ALT 12 0 - 44 U/L   Alkaline Phosphatase 44 38 - 126 U/L   Total Bilirubin 0.6 0.3 - 1.2 mg/dL   GFR, Estimated >41 >32 mL/min    Comment: (NOTE) Calculated using the CKD-EPI Creatinine Equation (2021)    Anion gap 10 5 - 15    Comment: Performed at  [facility], [address] [place], [place] [address]  CBC     Status: Abnormal   Collection Time: 02/13/23  6:19 PM  Result Value Ref Range   WBC 13.9 (H) 4.0 - 10.5 K/uL   RBC 4.69 3.87 - 5.11 MIL/uL   Hemoglobin 14.2 12.0 - 15.0 g/dL   HCT 27.2 53.6 - 64.4 %   MCV 92.8 80.0 - 100.0 fL   MCH 30.3 26.0 - 34.0 pg   MCHC 32.6 30.0 - 36.0 g/dL   RDW 03.4 74.2 - 59.5 %   Platelets 253 150 - 400 K/uL   nRBC 0.0 0.0 - 0.2 %    Comment: Performed at [facility], [address] [place], [place] [address]  Urinalysis, Routine w reflex microscopic -Urine, Clean Catch     Status: Abnormal   Collection Time: 02/13/23  6:19 PM  Result Value Ref Range   Color, Urine YELLOW (A) YELLOW   APPearance HAZY (A) CLEAR   Specific Gravity, Urine 1.025 1.005 - 1.030   pH 5.0 5.0 - 8.0   Glucose, UA NEGATIVE NEGATIVE  mg/dL   Hgb urine dipstick MODERATE (A) NEGATIVE   Bilirubin Urine NEGATIVE NEGATIVE   Ketones, ur NEGATIVE NEGATIVE mg/dL   Protein, ur 30 (A) NEGATIVE mg/dL   Nitrite NEGATIVE NEGATIVE   Leukocytes,Ua TRACE (A) NEGATIVE   RBC / HPF 6-10 0 - 5 RBC/hpf   WBC, UA 0-5 0 - 5 WBC/hpf   Bacteria, UA NONE SEEN NONE SEEN   Squamous Epithelial / HPF 6-10 0 - 5 /HPF   Mucus PRESENT     Comment: Performed at [facility], [address] [place], [place] [address]  POC urine preg, ED     Status: Normal   Collection Time: 02/13/23  6:47 PM  Result Value Ref Range   Preg Test, Ur Negative Negative       Assessment & Plan:   Problem List Items Addressed This Visit       Active Problems   Moderate episode of recurrent major depressive disorder (HCC)   Relevant Medications   LORazepam (ATIVAN) 2 MG tablet   trazodone (DESYREL) 300 MG tablet   Fibromyalgia   Relevant Medications   HYDROcodone-acetaminophen (NORCO) 10-325 MG tablet   gabapentin (NEURONTIN) 600 MG tablet   tiZANidine (ZANAFLEX) 4 MG tablet   trazodone (DESYREL) 300 MG tablet   Low back  pain   Relevant Medications   HYDROcodone-acetaminophen (NORCO) 10-325 MG tablet   gabapentin (NEURONTIN) 600 MG tablet   tiZANidine (ZANAFLEX) 4 MG tablet   Attention deficit hyperactivity disorder (ADHD), inattentive type, moderate   Relevant Medications   amphetamine-dextroamphetamine (ADDERALL) 20 MG tablet   Seasonal allergic rhinitis due to pollen   Relevant Medications   fexofenadine (ALLEGRA) 180 MG tablet   Generalized anxiety disorder   Relevant Medications   LORazepam (ATIVAN) 2 MG tablet   trazodone (DESYREL) 300 MG tablet    Return in about 1 month (around 04/07/2023) for F/U.   Total time spent: 30 minutes  Miki Kins, FNP  03/07/2023

## 2023-03-08 ENCOUNTER — Ambulatory Visit: Admitting: Family

## 2023-03-09 ENCOUNTER — Encounter: Payer: Self-pay | Admitting: Family

## 2023-03-28 ENCOUNTER — Other Ambulatory Visit: Payer: Self-pay | Admitting: Family

## 2023-04-07 ENCOUNTER — Ambulatory Visit: Payer: Commercial Managed Care - PPO | Admitting: Family

## 2023-04-10 ENCOUNTER — Other Ambulatory Visit: Payer: Self-pay

## 2023-04-10 ENCOUNTER — Ambulatory Visit: Payer: Commercial Managed Care - PPO | Admitting: Family

## 2023-04-10 DIAGNOSIS — M797 Fibromyalgia: Secondary | ICD-10-CM

## 2023-04-10 DIAGNOSIS — G8929 Other chronic pain: Secondary | ICD-10-CM

## 2023-04-11 MED ORDER — HYDROCODONE-ACETAMINOPHEN 10-325 MG PO TABS
1.0000 | ORAL_TABLET | Freq: Four times a day (QID) | ORAL | 0 refills | Status: DC | PRN
Start: 2023-04-11 — End: 2023-04-13

## 2023-04-13 ENCOUNTER — Encounter: Payer: Self-pay | Admitting: Family

## 2023-04-13 ENCOUNTER — Ambulatory Visit (INDEPENDENT_AMBULATORY_CARE_PROVIDER_SITE_OTHER): Payer: Commercial Managed Care - PPO | Admitting: Family

## 2023-04-13 DIAGNOSIS — M5441 Lumbago with sciatica, right side: Secondary | ICD-10-CM

## 2023-04-13 DIAGNOSIS — F411 Generalized anxiety disorder: Secondary | ICD-10-CM

## 2023-04-13 DIAGNOSIS — M797 Fibromyalgia: Secondary | ICD-10-CM | POA: Diagnosis not present

## 2023-04-13 DIAGNOSIS — F331 Major depressive disorder, recurrent, moderate: Secondary | ICD-10-CM | POA: Diagnosis not present

## 2023-04-13 DIAGNOSIS — G8929 Other chronic pain: Secondary | ICD-10-CM

## 2023-04-13 DIAGNOSIS — M5442 Lumbago with sciatica, left side: Secondary | ICD-10-CM

## 2023-04-13 DIAGNOSIS — F900 Attention-deficit hyperactivity disorder, predominantly inattentive type: Secondary | ICD-10-CM

## 2023-04-13 MED ORDER — LORAZEPAM 2 MG PO TABS
2.0000 mg | ORAL_TABLET | Freq: Three times a day (TID) | ORAL | 2 refills | Status: DC | PRN
Start: 2023-04-13 — End: 2023-06-02

## 2023-04-13 MED ORDER — AMPHETAMINE-DEXTROAMPHETAMINE 20 MG PO TABS: 20.0000 mg | ORAL_TABLET | Freq: Two times a day (BID) | ORAL | 0 refills | Status: DC

## 2023-04-13 MED ORDER — TIZANIDINE HCL 4 MG PO TABS
4.0000 mg | ORAL_TABLET | Freq: Three times a day (TID) | ORAL | 1 refills | Status: DC
Start: 2023-04-13 — End: 2023-06-14

## 2023-04-13 MED ORDER — HYDROCODONE-ACETAMINOPHEN 10-325 MG PO TABS
1.0000 | ORAL_TABLET | Freq: Four times a day (QID) | ORAL | 0 refills | Status: DC | PRN
Start: 2023-04-13 — End: 2023-06-20

## 2023-04-13 NOTE — Assessment & Plan Note (Signed)
Patient stable.  Well controlled with current therapy.   Continue current meds.  

## 2023-04-13 NOTE — Progress Notes (Signed)
Established Patient Office Visit  Subjective:  Patient ID: Sandra Aguilar, female    DOB: 1976-04-15  Age: 47 y.o. MRN: [id number]  Chief Complaint  Patient presents with   Follow-up    1 month follow up     Patient is here today for her 1 month follow up.  She has been feeling well since last appointment.   She does have additional concerns to discuss today.  She still has not heard from the pain clinic, but she is due for refill today.   Labs are not due today. She needs refills.   I have reviewed her active problem list, medication list, allergies, notes from last encounter, lab results, and PDMP for her appointment today.      No other concerns at this time.   Past Medical History:  Diagnosis Date   Depression    Family history of breast cancer 05/27/2018   Fibromyalgia    Insomnia    Lung nodule    Lung nodule, solitary 09/04/2020   PTSD (post-traumatic stress disorder)    Strep pharyngitis 02/17/2021   Last Assessment & Plan: Formatting of this note might be different from the original. Will not check a rapid strep test for her sore throat due to her husband was positive for strep throat diagnosed yesterday in the same clinic.  Will prescribe Pen-Vee K 500 mg 1 every 12 hours for 10 days.  Be sure to drink plenty of fluids including Gatorade, take Tylenol or Advil for pain or fever as needed.  I   Stroke [facility])    Suspected COVID-19 virus infection 06/29/2020   Formatting of this note might be different from the original. [name] presents via virtual visit with complaints of cough with dark yellow and green mucous, nasal congestion and stuffiness, low grade fevers, sore throat, nausea, vomiting, and recent sick exposure. She had a recent negative covid test last week, and started Augmentin for a URI after her negative Covid results.  She states she start   Thyroid nodule     Past Surgical History:  Procedure Laterality Date   APPENDECTOMY     CHOLECYSTECTOMY       Social History   Socioeconomic History   Marital status: Legally Separated    Spouse name: Not on file   Number of children: Not on file   Years of education: Not on file   Highest education level: Not on file  Occupational History   Not on file  Tobacco Use   Smoking status: Never    Passive exposure: Never   Smokeless tobacco: Never  Vaping Use   Vaping Use: Never used  Substance and Sexual Activity   Alcohol use: Not Currently    Comment: ocassionally   Drug use: Never   Sexual activity: Yes    Birth control/protection: None, Other-see comments    Comment: husband has vasectomy  Other Topics Concern   Not on file  Social History Narrative   Not on file   Social Determinants of Health   Financial Resource Strain: Not on file  Food Insecurity: Not on file  Transportation Needs: Not on file  Physical Activity: Not on file  Stress: Not on file  Social Connections: Not on file  Intimate Partner Violence: Not on file    Family History  Problem Relation Age of Onset   Heart attack Father    Vascular Disease Father    Vascular Disease Maternal Uncle    Obesity Paternal Aunt  Vascular Disease Paternal Aunt    Heart attack Maternal Grandmother    Ovarian cancer Maternal Grandmother    Vascular Disease Maternal Grandfather    Stroke Paternal Grandmother     Allergies  Allergen Reactions   Ciprofloxacin Nausea And Vomiting and Other (See Comments)    Neuropathy  Neuropathy  Neuropathy  Neuropathy  Neuropathy   Nitrofurantoin Hives    Other reaction(s): Hives/Swelling-Allergy   Promethazine Other (See Comments)    "Childhood," pt stated she has tolerated since   Trazodone     QT interval too long   Sulfa Antibiotics Nausea And Vomiting, Other (See Comments) and Rash    CAUSES PAIN, PT REPORTS    Review of Systems  Musculoskeletal:  Positive for back pain, joint pain and myalgias.  Psychiatric/Behavioral:  The patient is nervous/anxious.   All  other systems reviewed and are negative.      Objective:   BP 112/68   Pulse 92   Ht 5\' 5"  (1.651 m)   Wt 135 lb 9.6 oz (61.5 kg)   SpO2 90%   BMI 22.57 kg/m   Vitals:   04/13/23 1022  BP: 112/68  Pulse: 92  Height: 5\' 5"  (1.651 m)  Weight: 135 lb 9.6 oz (61.5 kg)  SpO2: 90%  BMI (Calculated): 22.57    Physical Exam Vitals and nursing note reviewed.  Constitutional:      Appearance: Normal appearance. She is normal weight.  HENT:     Head: Normocephalic.  Eyes:     Extraocular Movements: Extraocular movements intact.     Conjunctiva/sclera: Conjunctivae normal.     Pupils: Pupils are equal, round, and reactive to light.  Cardiovascular:     Rate and Rhythm: Normal rate.  Pulmonary:     Effort: Pulmonary effort is normal.  Neurological:     General: No focal deficit present.     Mental Status: She is alert and oriented to person, place, and time. Mental status is at baseline.  Psychiatric:        Mood and Affect: Mood normal.        Behavior: Behavior normal.        Thought Content: Thought content normal.        Judgment: Judgment normal.      No results found for any visits on 04/13/23.  Recent Results (from the past 2160 hour(s))  Lipase, blood     Status: None   Collection Time: 02/13/23  6:19 PM  Result Value Ref Range   Lipase 31 11 - 51 U/L    Comment: Performed at [facility], [address] [place], [place] [address]  Comprehensive metabolic panel     Status: Abnormal   Collection Time: 02/13/23  6:19 PM  Result Value Ref Range   Sodium 135 135 - 145 mmol/L   Potassium 3.9 3.5 - 5.1 mmol/L   Chloride 103 98 - 111 mmol/L   CO2 22 22 - 32 mmol/L   Glucose, Bld 116 (H) 70 - 99 mg/dL    Comment: Glucose reference range applies only to samples taken after fasting for at least 8 hours.   BUN 13 6 - 20 mg/dL   Creatinine, Ser 5.40 0.44 - 1.00 mg/dL   Calcium 98.1 8.9 - 19.1 mg/dL   Total Protein 8.6 (H) 6.5 - 8.1 g/dL   Albumin 4.4  3.5 - 5.0 g/dL   AST 19 15 - 41 U/L   ALT 12 0 - 44 U/L  Alkaline Phosphatase 44 38 - 126 U/L   Total Bilirubin 0.6 0.3 - 1.2 mg/dL   GFR, Estimated >09 >81 mL/min    Comment: (NOTE) Calculated using the CKD-EPI Creatinine Equation (2021)    Anion gap 10 5 - 15    Comment: Performed at [facility], [address] [place], [place] [address]  CBC     Status: Abnormal   Collection Time: 02/13/23  6:19 PM  Result Value Ref Range   WBC 13.9 (H) 4.0 - 10.5 K/uL   RBC 4.69 3.87 - 5.11 MIL/uL   Hemoglobin 14.2 12.0 - 15.0 g/dL   HCT 82.9 56.2 - 13.0 %   MCV 92.8 80.0 - 100.0 fL   MCH 30.3 26.0 - 34.0 pg   MCHC 32.6 30.0 - 36.0 g/dL   RDW 86.5 78.4 - 69.6 %   Platelets 253 150 - 400 K/uL   nRBC 0.0 0.0 - 0.2 %    Comment: Performed at [facility], [address] [place], [place] [address]  Urinalysis, Routine w reflex microscopic -Urine, Clean Catch     Status: Abnormal   Collection Time: 02/13/23  6:19 PM  Result Value Ref Range   Color, Urine YELLOW (A) YELLOW   APPearance HAZY (A) CLEAR   Specific Gravity, Urine 1.025 1.005 - 1.030   pH 5.0 5.0 - 8.0   Glucose, UA NEGATIVE NEGATIVE mg/dL   Hgb urine dipstick MODERATE (A) NEGATIVE   Bilirubin Urine NEGATIVE NEGATIVE   Ketones, ur NEGATIVE NEGATIVE mg/dL   Protein, ur 30 (A) NEGATIVE mg/dL   Nitrite NEGATIVE NEGATIVE   Leukocytes,Ua TRACE (A) NEGATIVE   RBC / HPF 6-10 0 - 5 RBC/hpf   WBC, UA 0-5 0 - 5 WBC/hpf   Bacteria, UA NONE SEEN NONE SEEN   Squamous Epithelial / HPF 6-10 0 - 5 /HPF   Mucus PRESENT     Comment: Performed at [facility], [address] [place], [place] [address]  POC urine preg, ED     Status: Normal   Collection Time: 02/13/23  6:47 PM  Result Value Ref Range   Preg Test, Ur Negative Negative       Assessment & Plan:   Problem List Items Addressed This Visit       Active Problems   Moderate episode of recurrent major depressive disorder (HCC)    Patient  stable.  Well controlled with current therapy.   Continue current meds.       Relevant Medications   LORazepam (ATIVAN) 2 MG tablet   Fibromyalgia    Patient stable.  Well controlled with current therapy.   Continue current meds.       Relevant Medications   HYDROcodone-acetaminophen (NORCO) 10-325 MG tablet   tiZANidine (ZANAFLEX) 4 MG tablet   Low back pain    Patient stable.  Well controlled with current therapy.   Continue current meds.        Relevant Medications   HYDROcodone-acetaminophen (NORCO) 10-325 MG tablet   tiZANidine (ZANAFLEX) 4 MG tablet   Attention deficit hyperactivity disorder (ADHD), inattentive type, moderate    Patient stable.  Well controlled with current therapy.   Continue current meds.        Relevant Medications   amphetamine-dextroamphetamine (ADDERALL) 20 MG tablet   Generalized anxiety disorder    Patient stable.  Well controlled with current therapy.   Continue current meds.        Relevant Medications   LORazepam (ATIVAN)  2 MG tablet    No follow-ups on file.   Total time spent: 30 minutes  Miki Kins, FNP  04/13/2023   This document may have been prepared by [facility] Voice Recognition software and as such may include unintentional dictation errors.

## 2023-05-12 ENCOUNTER — Ambulatory Visit: Payer: Commercial Managed Care - PPO | Admitting: Family

## 2023-05-16 ENCOUNTER — Ambulatory Visit: Payer: BC Managed Care – PPO

## 2023-05-16 DIAGNOSIS — Z01419 Encounter for gynecological examination (general) (routine) without abnormal findings: Secondary | ICD-10-CM

## 2023-05-23 ENCOUNTER — Ambulatory Visit: Payer: Commercial Managed Care - PPO | Admitting: Family

## 2023-05-25 ENCOUNTER — Ambulatory Visit: Payer: Commercial Managed Care - PPO | Admitting: Family

## 2023-06-02 ENCOUNTER — Encounter: Payer: Self-pay | Admitting: Family

## 2023-06-02 ENCOUNTER — Ambulatory Visit (INDEPENDENT_AMBULATORY_CARE_PROVIDER_SITE_OTHER): Payer: Commercial Managed Care - PPO | Admitting: Family

## 2023-06-02 ENCOUNTER — Ambulatory Visit: Payer: Self-pay

## 2023-06-02 VITALS — BP 118/72 | HR 84 | Temp 98.1°F | Ht 65.0 in | Wt 134.4 lb

## 2023-06-02 DIAGNOSIS — E041 Nontoxic single thyroid nodule: Secondary | ICD-10-CM | POA: Diagnosis not present

## 2023-06-02 DIAGNOSIS — M2550 Pain in unspecified joint: Secondary | ICD-10-CM | POA: Diagnosis not present

## 2023-06-02 DIAGNOSIS — F900 Attention-deficit hyperactivity disorder, predominantly inattentive type: Secondary | ICD-10-CM

## 2023-06-02 DIAGNOSIS — J4520 Mild intermittent asthma, uncomplicated: Secondary | ICD-10-CM | POA: Insufficient documentation

## 2023-06-02 DIAGNOSIS — J301 Allergic rhinitis due to pollen: Secondary | ICD-10-CM

## 2023-06-02 DIAGNOSIS — K5903 Drug induced constipation: Secondary | ICD-10-CM

## 2023-06-02 DIAGNOSIS — G894 Chronic pain syndrome: Secondary | ICD-10-CM

## 2023-06-02 DIAGNOSIS — M797 Fibromyalgia: Secondary | ICD-10-CM | POA: Diagnosis not present

## 2023-06-02 DIAGNOSIS — R5383 Other fatigue: Secondary | ICD-10-CM | POA: Diagnosis not present

## 2023-06-02 DIAGNOSIS — N9489 Other specified conditions associated with female genital organs and menstrual cycle: Secondary | ICD-10-CM

## 2023-06-02 DIAGNOSIS — R7982 Elevated C-reactive protein (CRP): Secondary | ICD-10-CM

## 2023-06-02 DIAGNOSIS — M5442 Lumbago with sciatica, left side: Secondary | ICD-10-CM | POA: Diagnosis not present

## 2023-06-02 DIAGNOSIS — G479 Sleep disorder, unspecified: Secondary | ICD-10-CM

## 2023-06-02 DIAGNOSIS — F411 Generalized anxiety disorder: Secondary | ICD-10-CM

## 2023-06-02 DIAGNOSIS — G8929 Other chronic pain: Secondary | ICD-10-CM

## 2023-06-02 DIAGNOSIS — G939 Disorder of brain, unspecified: Secondary | ICD-10-CM

## 2023-06-02 DIAGNOSIS — T402X5A Adverse effect of other opioids, initial encounter: Secondary | ICD-10-CM

## 2023-06-02 DIAGNOSIS — R739 Hyperglycemia, unspecified: Secondary | ICD-10-CM

## 2023-06-02 DIAGNOSIS — D72829 Elevated white blood cell count, unspecified: Secondary | ICD-10-CM

## 2023-06-02 DIAGNOSIS — D1809 Hemangioma of other sites: Secondary | ICD-10-CM

## 2023-06-02 DIAGNOSIS — Z79899 Other long term (current) drug therapy: Secondary | ICD-10-CM

## 2023-06-02 LAB — HEMOGLOBIN A1C: Hgb A1c MFr Bld: 5.5 % (ref 4.6–6.5)

## 2023-06-02 LAB — VITAMIN B12: Vitamin B-12: 222 pg/mL (ref 211–911)

## 2023-06-02 LAB — T3, FREE: T3, Free: 3.3 pg/mL (ref 2.3–4.2)

## 2023-06-02 LAB — T4, FREE: Free T4: 0.82 ng/dL (ref 0.60–1.60)

## 2023-06-02 LAB — C-REACTIVE PROTEIN: CRP: <1 mg/dL (ref 0.5–20.0)

## 2023-06-02 LAB — SEDIMENTATION RATE: Sed Rate: 13 mm/hr (ref 0–20)

## 2023-06-02 LAB — TSH: TSH: 1.35 u[IU]/mL (ref 0.35–5.50)

## 2023-06-02 MED ORDER — LORAZEPAM 2 MG PO TABS
2.0000 mg | ORAL_TABLET | Freq: Two times a day (BID) | ORAL | 0 refills | Status: DC | PRN
Start: 2023-06-02 — End: 2023-08-17

## 2023-06-02 MED ORDER — TRAZODONE HCL 300 MG PO TABS: 300.0000 mg | ORAL_TABLET | Freq: Every day | ORAL | 0 refills | Status: DC

## 2023-06-02 MED ORDER — AMPHETAMINE-DEXTROAMPHETAMINE 20 MG PO TABS: 20.0000 mg | ORAL_TABLET | Freq: Two times a day (BID) | ORAL | 0 refills | Status: DC

## 2023-06-02 MED ORDER — GABAPENTIN 600 MG PO TABS
600.0000 mg | ORAL_TABLET | Freq: Two times a day (BID) | ORAL | 0 refills | Status: DC
Start: 2023-06-02 — End: 2023-09-01

## 2023-06-02 MED ORDER — ALBUTEROL SULFATE HFA 108 (90 BASE) MCG/ACT IN AERS
2.0000 | INHALATION_SPRAY | Freq: Four times a day (QID) | RESPIRATORY_TRACT | 0 refills | Status: AC | PRN
Start: 2023-06-02 — End: ?

## 2023-06-02 NOTE — Patient Instructions (Addendum)
  Stop by the lab prior to leaving today. I will notify you of your results once received.   A referral was placed today for both psychiatry and pain management.  Please let us know if you have not heard back within 2 weeks about the referral.  MRI brain was ordered pending insurance approval.   Bring in ultrasound of the lesion   Try to taper down on pain medication. This may help with constipation.  Start miralax daily as well as stool softener make sure drinking enough water.   Regards,   Mort Sawyers FNP-C

## 2023-06-02 NOTE — Assessment & Plan Note (Signed)
Long d/w pt on this and risks involved with each medication.  I prefer that pt see psychiatry to medically manage and try to wean pt off of benzo use and start on a daily medication.  Multiple ssri and snri failures per pt personal recollection.  Also psychiatry to take over management of ADD.  Advised pt to get narcan for opiod use.  Was very straight forward with pt and advised I will not be filling any norco pain medication. Referral placed for pain management. I advised I do not prescribe for chronic pain medication. Will also consider referral to pharmacist for polypharmacy in future

## 2023-06-02 NOTE — Assessment & Plan Note (Signed)
Pt to bring in copy of most recent April 2024 u/s thyroid  Upon review will determine if needed to send to endo

## 2023-06-02 NOTE — Assessment & Plan Note (Signed)
Ordered mri w/wo contrast Pending results will likely refer to neurology for ongoing evaluation

## 2023-06-02 NOTE — Assessment & Plan Note (Signed)
Not controlled 

## 2023-06-02 NOTE — Assessment & Plan Note (Signed)
Advised do not continue taking lorazepam with trazodone.  This can increase risk of CNS and respiratory depression.

## 2023-06-02 NOTE — Assessment & Plan Note (Signed)
Reviewed MRI thoracic and lumbar spine however  Time did not allow for proper evaluation of this in its entirety due to time constraints.  Close f/u x one week and we will discuss further.

## 2023-06-02 NOTE — Assessment & Plan Note (Signed)
Repeat crp today and ordering ana rf to r/o autoimmune process pending results.

## 2023-06-02 NOTE — Progress Notes (Signed)
New Patient Office Visit  Subjective:  Patient ID: Sandra Aguilar, female    DOB: Apr 10, 1976  Age: 47 y.o. MRN: [id number]  CC:  Chief Complaint  Patient presents with   Establish Care    Bowel movement issues,  Thyroid recheck-nodule Right side      HPI Sandra Aguilar is here to establish care as a new patient.  Oriented to practice routines and expectations.  Prior provider was: Dr. Grayling Congress   Pt is with acute concerns.   chronic concerns:  Add: diagnosed back in 2010 has been on adderall 20 mg twice daily. States was diagnosed with pcp and then later again with psychiatry in 2021.   Anxiety: on lorazepam 2 mg prn as well as Has tried sertraline , lexapro, prozac  but states did not work.  Has tried effexor without relief, has also tried cymbalta but it made her feel loopy Has been on trials for medications since 2012.  Has seen psychiatrist in the past but not recently.  Has seen psychologist but was told she didn't need to go anymore.  Takes trazodone as well for sleep but at times lorazepam she will take at the same time.   Fibromyalgia: takes gabapentin 600 mg twice daily. Diagnosed back in 2020 With rheumatology in [place], she does also take tizanidine but she doesn't like the symptoms she experiences. On pain medication as well at norco 10-325 mg she does state she is taking this four times daily.   Thyroid u/s back in 10/2020, states aspiration was completed with 'benign' findings. Started with Dr. Berna Bue Endo after moving seen in 3/22 was advised to f/u in three months. Per note via care everywhere left thyroid nodule. She states feels bigger since 2022, and states went to ER and the u/s showed the thyroid was enlarged, states it was in 02-28-23.   Daughter passed away in 05-30-2022so she never made her f/u with the endo.   Brain lesion oleft cerebellum, unchanged insize back in 2016. Was seeing neurologist in [place] after that after moving from  [place]. She does state that she woke up in 2012 and couldn't move on her  MRI thoracic and lumbar spine. Incidental vertebral body hemangiomata. Questionable T2 prolongation of conus versus prominence of central canal. Complex cystic structure within the right adnexa which could represent a hemorrhagic cyst or endometrioma.   Constipation , ongoing. Pt is taking norco four times daily for chronic pain.       03/07/2023    1:11 PM  PHQ9 SCORE ONLY  PHQ-9 Total Score 1       ROS: Negative unless specifically indicated above in HPI.   Current Outpatient Medications:    fluticasone (FLONASE) 50 MCG/ACT nasal spray, Place 2 sprays into both nostrils daily., Disp: 16 g, Rfl: 0   HYDROcodone-acetaminophen (NORCO) 10-325 MG tablet, Take 1 tablet by mouth every 6 (six) hours as needed., Disp: 120 tablet, Rfl: 0   ondansetron (ZOFRAN-ODT) 8 MG disintegrating tablet, Take 1 tablet (8 mg total) by mouth every 8 (eight) hours as needed for nausea or vomiting., Disp: 20 tablet, Rfl: 0   tiZANidine (ZANAFLEX) 4 MG tablet, Take 1 tablet (4 mg total) by mouth 3 (three) times daily., Disp: 90 tablet, Rfl: 1   albuterol (VENTOLIN HFA) 108 (90 Base) MCG/ACT inhaler, Inhale 2 puffs into the lungs every 6 (six) hours as needed for shortness of breath., Disp: 6.7 g, Rfl: 0   amphetamine-dextroamphetamine (ADDERALL) 20 MG tablet, Take  1 tablet (20 mg total) by mouth 2 (two) times daily., Disp: 60 tablet, Rfl: 0   gabapentin (NEURONTIN) 600 MG tablet, Take 1 tablet (600 mg total) by mouth 2 (two) times daily., Disp: 180 tablet, Rfl: 0   LORazepam (ATIVAN) 2 MG tablet, Take 1 tablet (2 mg total) by mouth 2 (two) times daily as needed for anxiety., Disp: 60 tablet, Rfl: 0   trazodone (DESYREL) 300 MG tablet, Take 1 tablet (300 mg total) by mouth at bedtime., Disp: 90 tablet, Rfl: 0 Past Medical History:  Diagnosis Date   Depression    Family history of breast cancer 05/27/2018   Fibromyalgia    Insomnia     Lung nodule    Lung nodule, solitary 09/04/2020   PTSD (post-traumatic stress disorder)    Strep pharyngitis 02/17/2021   Last Assessment & Plan: Formatting of this note might be different from the original. Will not check a rapid strep test for her sore throat due to her husband was positive for strep throat diagnosed yesterday in the same clinic.  Will prescribe Pen-Vee K 500 mg 1 every 12 hours for 10 days.  Be sure to drink plenty of fluids including Gatorade, take Tylenol or Advil for pain or fever as needed.  I   Stroke [facility])    Suspected COVID-19 virus infection 06/29/2020   Formatting of this note might be different from the original. Louanne presents via virtual visit with complaints of cough with dark yellow and green mucous, nasal congestion and stuffiness, low grade fevers, sore throat, nausea, vomiting, and recent sick exposure. She had a recent negative covid test last week, and started Augmentin for a URI after her negative Covid results.  She states she start   Thyroid nodule    Past Surgical History:  Procedure Laterality Date   APPENDECTOMY     CHOLECYSTECTOMY      Objective:   Today's Vitals: BP 118/72   Pulse 84   Temp 98.1 F (36.7 C)   Ht 5\' 5"  (1.651 m)   Wt 134 lb 6.4 oz (61 kg)   SpO2 98%   BMI 22.37 kg/m   Physical Exam Constitutional:      General: She is not in acute distress.    Appearance: Normal appearance. She is normal weight. She is not ill-appearing, toxic-appearing or diaphoretic.  HENT:     Head: Normocephalic.  Cardiovascular:     Rate and Rhythm: Normal rate and regular rhythm.  Pulmonary:     Effort: Pulmonary effort is normal.     Breath sounds: Normal breath sounds.  Musculoskeletal:        General: Normal range of motion.     Right lower leg: No edema.     Left lower leg: No edema.  Neurological:     General: No focal deficit present.     Mental Status: She is alert and oriented to person, place, and time. Mental status is at  baseline.     Cranial Nerves: Cranial nerves 2-12 are intact.  Psychiatric:        Attention and Perception: Attention and perception normal.        Mood and Affect: Mood is depressed. Affect is tearful.        Speech: Speech normal.        Behavior: Behavior normal.        Thought Content: Thought content normal.        Judgment: Judgment normal.     Assessment & Plan:  Cerebellar lesion Assessment & Plan: Ordered mri w/wo contrast Pending results will likely refer to neurology for ongoing evaluation   Orders: -     MR BRAIN W WO CONTRAST; Future  Attention deficit hyperactivity disorder (ADHD), inattentive type, moderate Assessment & Plan: Stable on adderall Did d/w pt polypharmacy and s/e associated.  Did stress pt will need to see psychiatry, referral placed.  I will not be refilling pain medications, benzodiazepines and or adderall after 30 days.  Pdmp reviewed today.  UDS ordered today pending results.  Non opioid contract signed today.    Orders: -     Amphetamine-Dextroamphetamine; Take 1 tablet (20 mg total) by mouth 2 (two) times daily.  Dispense: 60 tablet; Refill: 0  Fibromyalgia Assessment & Plan: Not controlled.    Orders: -     Gabapentin; Take 1 tablet (600 mg total) by mouth 2 (two) times daily.  Dispense: 180 tablet; Refill: 0 -     Ambulatory referral to Pain Clinic  Chronic bilateral low back pain with bilateral sciatica -     Gabapentin; Take 1 tablet (600 mg total) by mouth 2 (two) times daily.  Dispense: 180 tablet; Refill: 0  Generalized anxiety disorder Assessment & Plan: Was referred for ongoing management with Psychiatry last visit 4/24 per note. Pt has not seen.  Referral placed again for psychiatry for medication management.  Advised will only fill 30 day supply of ativan.  Long d/w pt on polypharmacy and risks associated with her medications.  Also referral placed for psychology as I do feel this is necessary to help with fibromyalgia  and chronic pain, managed stress/depression/anxiety and this should help.     Orders: -     LORazepam; Take 1 tablet (2 mg total) by mouth 2 (two) times daily as needed for anxiety.  Dispense: 60 tablet; Refill: 0 -     Ambulatory referral to Psychiatry -     Ambulatory referral to Psychology  Leukocytosis, unspecified type  Hyperglycemia -     Hemoglobin A1c  Elevated C-reactive protein (CRP) Assessment & Plan: Repeat crp today and ordering ana rf to r/o autoimmune process pending results.    Sleep disorder Assessment & Plan: Advised do not continue taking lorazepam with trazodone.  This can increase risk of CNS and respiratory depression.    Orders: -     traZODone HCl; Take 1 tablet (300 mg total) by mouth at bedtime.  Dispense: 90 tablet; Refill: 0  Seasonal allergic rhinitis due to pollen  Mild intermittent asthma without complication -     Albuterol Sulfate HFA; Inhale 2 puffs into the lungs every 6 (six) hours as needed for shortness of breath.  Dispense: 6.7 g; Refill: 0  Thyroid nodule Assessment & Plan: Pt to bring in copy of most recent April 2024 u/s thyroid  Upon review will determine if needed to send to endo   Orders: -     Thyroid Peroxidase Antibodies (TPO) (REFL) -     T3, free -     T4, free -     TSH  Other fatigue -     T3, free -     T4, free -     TSH -     Vitamin B12  Polyarthralgia -     C-reactive protein -     ANA Screen,IFA,Reflex Titer/Pattern,Reflex Mplx 11 Ab Cascade with IdentRA -     Sedimentation rate -     Rheumatoid factor; Future  Polypharmacy -  DRUG MONITORING, PANEL 8 WITH CONFIRMATION, URINE  High risk medication use Assessment & Plan: Long d/w pt on this and risks involved with each medication.  I prefer that pt see psychiatry to medically manage and try to wean pt off of benzo use and start on a daily medication.  Multiple ssri and snri failures per pt personal recollection.  Also psychiatry to take over  management of ADD.  Advised pt to get narcan for opiod use.  Was very straight forward with pt and advised I will not be filling any norco pain medication. Referral placed for pain management. I advised I do not prescribe for chronic pain medication. Will also consider referral to pharmacist for polypharmacy in future    Orders: -     DRUG MONITORING, PANEL 8 WITH CONFIRMATION, URINE  Chronic pain syndrome Assessment & Plan: On gabapentin norco and muscle relaxer's without relief.  Did advise pt I suspect also psychological in anture And or underlying disease  Referral placed for pain management Lab workup and imaging workup in place for multiple evaluations   Hemangioma of other sites Assessment & Plan: Reviewed MRI thoracic and lumbar spine however  Time did not allow for proper evaluation of this in its entirety due to time constraints.  Close f/u x one week and we will discuss further.    Adnexal mass Assessment & Plan: Time did not allow for proper evaluation of this in its entirety due to time constraints.  Close f/u x one week and we will discuss further.    Constipation due to opioid therapy Assessment & Plan: D/w pt likely due to opiod use  Advised daily stool softener and daily laxative  Decrease norco, d/w her on how to wean down.     Length of appointment reading through multiple office speciality notes, historical labs and imaging dx tests resulted in 65 minutes clinic time.   Follow-up: Return in about 1 week (around 06/09/2023) for f/u multiple concerns .   Mort Sawyers, FNP

## 2023-06-02 NOTE — Assessment & Plan Note (Signed)
D/w pt likely due to opiod use  Advised daily stool softener and daily laxative  Decrease norco, d/w her on how to wean down.

## 2023-06-02 NOTE — Assessment & Plan Note (Signed)
On gabapentin norco and muscle relaxer's without relief.  Did advise pt I suspect also psychological in anture And or underlying disease  Referral placed for pain management Lab workup and imaging workup in place for multiple evaluations

## 2023-06-02 NOTE — Assessment & Plan Note (Signed)
Time did not allow for proper evaluation of this in its entirety due to time constraints.  Close f/u x one week and we will discuss further.

## 2023-06-02 NOTE — Assessment & Plan Note (Signed)
Was referred for ongoing management with Psychiatry last visit 4/24 per note. Pt has not seen.  Referral placed again for psychiatry for medication management.  Advised will only fill 30 day supply of ativan.  Long d/w pt on polypharmacy and risks associated with her medications.  Also referral placed for psychology as I do feel this is necessary to help with fibromyalgia and chronic pain, managed stress/depression/anxiety and this should help.

## 2023-06-02 NOTE — Assessment & Plan Note (Signed)
Stable on adderall Did d/w pt polypharmacy and s/e associated.  Did stress pt will need to see psychiatry, referral placed.  I will not be refilling pain medications, benzodiazepines and or adderall after 30 days.  Pdmp reviewed today.  UDS ordered today pending results.  Non opioid contract signed today.

## 2023-06-03 ENCOUNTER — Other Ambulatory Visit: Payer: Self-pay | Admitting: Family

## 2023-06-03 DIAGNOSIS — J4520 Mild intermittent asthma, uncomplicated: Secondary | ICD-10-CM

## 2023-06-05 NOTE — Progress Notes (Signed)
Pt tested positive in her urine for lorazepam, hydrocodone, amphetamines (adderall) but also was positive for hydromorphone. Is she also taking dilaudid?

## 2023-06-06 ENCOUNTER — Encounter: Payer: Self-pay | Admitting: *Deleted

## 2023-06-06 NOTE — Telephone Encounter (Signed)
I am only attaching you to this message thread in case it gets out of hand.

## 2023-06-07 ENCOUNTER — Telehealth: Payer: Self-pay | Admitting: Family

## 2023-06-07 ENCOUNTER — Encounter: Payer: Self-pay | Admitting: *Deleted

## 2023-06-07 NOTE — Telephone Encounter (Signed)
Dr. Caryn Section Office called to let Sandra Aguilar know that they received a referral for the pt & the pt's insurance isn't in network with their office. Call back # [phone]

## 2023-06-07 NOTE — Telephone Encounter (Signed)
See referral for updates, referral being re-routed to a location that accepts her insurance.

## 2023-06-08 ENCOUNTER — Ambulatory Visit: Payer: Self-pay | Admitting: Family

## 2023-06-08 NOTE — Telephone Encounter (Signed)
Noted, thank you

## 2023-06-08 NOTE — Telephone Encounter (Signed)
My phone conversation  was witnessed by  Wendie Simmer CMA. I did not ask the patient if she had received anything off the street. I did ask patient if she had had any trips to ED,  urgent care, or any procedures that were not in the [facility] system as they could have given something as treatment or part of procedure she did not recall.

## 2023-06-12 NOTE — Telephone Encounter (Signed)
Called patient she is still having some "burning with breathing" and not feeling well at all. Tried to set patient up to be seen by another office but she didn't feel like she could drive that far. She will go to urgent care for evaluation. She agreed to call office if she changes her mind so we can set up for visit.

## 2023-06-12 NOTE — Telephone Encounter (Signed)
Can we please look into alternate referral? Pt states she was called back by our referred pain management doc and they stated they no longer do pain management.

## 2023-06-12 NOTE — Telephone Encounter (Signed)
Please triage. I see note from earlier with pt c/o chest pain and then Sandra Aguilar calling and stating she is having burning with breathing. If this is the case I actually feel she should call 911 or go to Er.

## 2023-06-12 NOTE — Telephone Encounter (Signed)
Patient called office back. Call was dropped before sent to me. I attempted to call back no answer

## 2023-06-12 NOTE — Telephone Encounter (Signed)
Left message to return call to our office.  

## 2023-06-13 NOTE — Telephone Encounter (Signed)
Pt returned phone call.  Reports that she is having chest tightness and burning when she breathes.  Pt says that is no better or worse than yesterday.  Encouraged pt pt go to ED/UC today for evaluation. She denied being seen today and wants to wait until her appointment tomorrow with Mort Sawyers, NP at 7:20a.  Pt reports that she feels like when she had pneumonia in the past.  Encouraged pt to seek emergency help if she had any trouble breathing.  Pt verbalized understanding.

## 2023-06-13 NOTE — Telephone Encounter (Signed)
Noted. Appropriate cautions were given, and pt still refused. Will see her in office.

## 2023-06-13 NOTE — Telephone Encounter (Signed)
LMTCB

## 2023-06-14 ENCOUNTER — Telehealth: Payer: Self-pay

## 2023-06-14 ENCOUNTER — Ambulatory Visit: Payer: Commercial Managed Care - PPO | Admitting: Family

## 2023-06-14 ENCOUNTER — Ambulatory Visit (INDEPENDENT_AMBULATORY_CARE_PROVIDER_SITE_OTHER)
Admission: RE | Admit: 2023-06-14 | Discharge: 2023-06-14 | Disposition: A | Discharge status: Home / Self Care | Payer: Commercial Managed Care - PPO | Source: Ambulatory Visit | Attending: Family | Admitting: Family

## 2023-06-14 VITALS — BP 112/60 | HR 103 | Temp 97.7°F | Ht 65.0 in | Wt 139.6 lb

## 2023-06-14 DIAGNOSIS — S8991XA Unspecified injury of right lower leg, initial encounter: Secondary | ICD-10-CM

## 2023-06-14 DIAGNOSIS — G8929 Other chronic pain: Secondary | ICD-10-CM | POA: Diagnosis not present

## 2023-06-14 DIAGNOSIS — J029 Acute pharyngitis, unspecified: Secondary | ICD-10-CM

## 2023-06-14 DIAGNOSIS — M25561 Pain in right knee: Secondary | ICD-10-CM

## 2023-06-14 DIAGNOSIS — G894 Chronic pain syndrome: Secondary | ICD-10-CM

## 2023-06-14 DIAGNOSIS — J4521 Mild intermittent asthma with (acute) exacerbation: Secondary | ICD-10-CM

## 2023-06-14 LAB — POCT RAPID STREP A (OFFICE): Rapid Strep A Screen: NEGATIVE

## 2023-06-14 MED ORDER — PREDNISONE 10 MG (21) PO TBPK: ORAL_TABLET | ORAL | 0 refills | Status: DC

## 2023-06-14 NOTE — Progress Notes (Signed)
Established Patient Office Visit  Subjective:      CC:  Chief Complaint  Patient presents with   Cough   Shortness of Breath    Started 06/11/23 had to call out from work. Has had fever 06/12/23 100.6.    Knee Pain    Right     HPI: Sandra Aguilar is a 47 y.o. female presenting on 06/14/2023 for Cough, Shortness of Breath (Started 06/11/23 had to call out from work. Has had fever 06/12/23 100.6. ), and Knee Pain (Right ) . Sunday night started to feel not so well, was hard for her to catch her breath but then she went to focus on her breathing and she was able to take some deep breaths. Woke up shaking in the middle of the night, and when she woke up it was 100.6 F. Later on that am she was sweating pretty bad. She does still have some chest tightness with taking a deep breath but not as bad as it was before. Still coughing that is dry and non productive, some sob still. Has taken some advil with some relief of the fever.   She does have h/o asthma, she has not used her albuterol inhaler she forgot about it. Slight sore throat, some ear pain and some nasal congestion.   Did covid test at home and it was negative, this was Monday.   Also c/o right knee pain.  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  She states for some time, more chronic, she has trouble bending her leg and can get really stiff to where she can not bend it. She does not have known injury of the knee. She did fall two weeks ago and the pain has become a bit more.     Social history:  Relevant past medical, surgical, family and social history reviewed and updated as indicated. Interim medical history since our last visit reviewed.  Allergies and medications reviewed and updated.  DATA REVIEWED: CHART IN EPIC     ROS: Negative unless specifically indicated above in HPI.    Current Outpatient Medications:    albuterol  (VENTOLIN HFA) 108 (90 Base) MCG/ACT inhaler, INHALE 2 PUFFS INTO THE LUNGS EVERY 6 HOURS AS NEEDED FOR SHORTNESS OF BREATH, Disp: 18 each, Rfl: 0   amphetamine-dextroamphetamine (ADDERALL) 20 MG tablet, Take 1 tablet (20 mg total) by mouth 2 (two) times daily., Disp: 60 tablet, Rfl: 0   fluticasone (FLONASE) 50 MCG/ACT nasal spray, Place 2 sprays into both nostrils daily., Disp: 16 g, Rfl: 0   gabapentin (NEURONTIN) 600 MG tablet, Take 1 tablet (600 mg total) by mouth 2 (two) times daily., Disp: 180 tablet, Rfl: 0   HYDROcodone-acetaminophen (NORCO) 10-325 MG tablet, Take 1 tablet by mouth every 6 (six) hours as needed., Disp: 120 tablet, Rfl: 0   LORazepam (ATIVAN) 2 MG tablet, Take 1 tablet (2 mg total) by mouth 2 (two) times daily as needed for anxiety., Disp: 60 tablet, Rfl: 0   ondansetron (ZOFRAN-ODT) 8 MG disintegrating tablet, Take 1 tablet (8 mg total) by mouth every 8 (eight) hours as needed for nausea or vomiting., Disp: 20 tablet, Rfl: 0   predniSONE (STERAPRED UNI-PAK 21 TAB) 10 MG (21) TBPK tablet, Take as directed, Disp: 1 each, Rfl: 0   trazodone (DESYREL) 300 MG tablet, Take 1 tablet (300 mg total) by mouth at  bedtime., Disp: 90 tablet, Rfl: 0      Objective:    BP 112/60   Pulse (!) 103   Temp 97.7 F (36.5 C) (Oral)   Ht 5\' 5"  (1.651 m)   Wt 139 lb 9.6 oz (63.3 kg)   LMP 06/07/2023 (Approximate) Comment: Pt states no chance of pregnancy  SpO2 97%   BMI 23.23 kg/m   Wt Readings from Last 3 Encounters:  06/14/23 139 lb 9.6 oz (63.3 kg)  06/02/23 134 lb 6.4 oz (61 kg)  04/13/23 135 lb 9.6 oz (61.5 kg)    Physical Exam Constitutional:      General: She is not in acute distress.    Appearance: Normal appearance. She is normal weight. She is not ill-appearing, toxic-appearing or diaphoretic.  HENT:     Head: Normocephalic.     Right Ear: A middle ear effusion is present. Tympanic membrane is not erythematous or bulging.     Left Ear: A middle ear effusion is  present. Tympanic membrane is not erythematous or bulging.     Nose: Nose normal.     Mouth/Throat:     Mouth: Mucous membranes are dry.     Pharynx: Posterior oropharyngeal erythema present. No oropharyngeal exudate.  Eyes:     Extraocular Movements: Extraocular movements intact.     Pupils: Pupils are equal, round, and reactive to light.  Cardiovascular:     Rate and Rhythm: Normal rate and regular rhythm.     Pulses: Normal pulses.     Heart sounds: Normal heart sounds.  Pulmonary:     Effort: Pulmonary effort is normal.     Breath sounds: Normal breath sounds.  Musculoskeletal:     Cervical back: Normal range of motion.     Right knee: Bony tenderness present. Normal range of motion. Tenderness (posterior knee tenderness) present. No patellar tendon tenderness.  Lymphadenopathy:     Cervical: Cervical adenopathy present.     Right cervical: Superficial cervical adenopathy present.  Neurological:     General: No focal deficit present.     Mental Status: She is alert and oriented to person, place, and time. Mental status is at baseline.  Psychiatric:        Mood and Affect: Mood normal.        Behavior: Behavior normal.        Thought Content: Thought content normal.        Judgment: Judgment normal.           Assessment & Plan:  Chronic pain of right knee -     DG Knee Complete 4 Views Right; Future  Right knee injury, initial encounter Assessment & Plan: Recent injury, ordering xray pending results.  Possible bakers cyst.  Advised tylenol prn, heat ice to site prn.    Orders: -     DG Knee Complete 4 Views Right; Future -     predniSONE; Take as directed  Dispense: 1 each; Refill: 0  Sore throat -     POCT rapid strep A  Chronic pain syndrome -     Ambulatory referral to Pain Clinic  Mild intermittent asthma with acute exacerbation Assessment & Plan: Suspected viral infection  Rx prednisone pack  Albuterol prn  Strep test in office negative.     Orders: -     predniSONE; Take as directed  Dispense: 1 each; Refill: 0     Return if symptoms worsen or fail to improve.  Mort Sawyers, MSN, APRN, FNP-C [facility] [place]  [facility]

## 2023-06-14 NOTE — Assessment & Plan Note (Signed)
Suspected viral infection  Rx prednisone pack  Albuterol prn  Strep test in office negative.

## 2023-06-14 NOTE — Assessment & Plan Note (Signed)
Recent injury, ordering xray pending results.  Possible bakers cyst.  Advised tylenol prn, heat ice to site prn.

## 2023-06-14 NOTE — Telephone Encounter (Signed)
Received the attached fax.

## 2023-06-15 NOTE — Telephone Encounter (Signed)
Looks like another pain management does not actually treat for pain with medications. Any ideas?

## 2023-06-16 NOTE — Telephone Encounter (Signed)
We can try [facility].   [facility] [address] [place], [place] [address] Phone: [phone] Fax: [phone]

## 2023-06-19 ENCOUNTER — Other Ambulatory Visit: Payer: Self-pay | Admitting: Family

## 2023-06-19 ENCOUNTER — Ambulatory Visit
Admission: RE | Admit: 2023-06-19 | Discharge: 2023-06-19 | Disposition: A | Discharge status: Home / Self Care | Payer: Commercial Managed Care - PPO | Source: Ambulatory Visit | Attending: Family | Admitting: Family

## 2023-06-19 ENCOUNTER — Ambulatory Visit (INDEPENDENT_AMBULATORY_CARE_PROVIDER_SITE_OTHER): Payer: Commercial Managed Care - PPO | Admitting: Family

## 2023-06-19 ENCOUNTER — Ambulatory Visit
Admission: RE | Admit: 2023-06-19 | Discharge: 2023-06-19 | Disposition: A | Discharge status: Home / Self Care | Payer: Commercial Managed Care - PPO | Attending: Family | Admitting: Family

## 2023-06-19 VITALS — BP 115/79 | HR 60 | Ht 65.0 in | Wt 143.0 lb

## 2023-06-19 DIAGNOSIS — M797 Fibromyalgia: Secondary | ICD-10-CM | POA: Diagnosis not present

## 2023-06-19 DIAGNOSIS — R2232 Localized swelling, mass and lump, left upper limb: Secondary | ICD-10-CM

## 2023-06-19 DIAGNOSIS — E041 Nontoxic single thyroid nodule: Secondary | ICD-10-CM

## 2023-06-19 DIAGNOSIS — G8929 Other chronic pain: Secondary | ICD-10-CM

## 2023-06-19 DIAGNOSIS — G939 Disorder of brain, unspecified: Secondary | ICD-10-CM

## 2023-06-19 DIAGNOSIS — M5442 Lumbago with sciatica, left side: Secondary | ICD-10-CM | POA: Diagnosis not present

## 2023-06-19 DIAGNOSIS — U099 Post covid-19 condition, unspecified: Secondary | ICD-10-CM

## 2023-06-19 DIAGNOSIS — F331 Major depressive disorder, recurrent, moderate: Secondary | ICD-10-CM

## 2023-06-19 DIAGNOSIS — J324 Chronic pansinusitis: Secondary | ICD-10-CM

## 2023-06-19 DIAGNOSIS — M5441 Lumbago with sciatica, right side: Secondary | ICD-10-CM

## 2023-06-19 NOTE — Progress Notes (Signed)
Established Patient Office Visit  Subjective:  Patient ID: Sandra Aguilar, female    DOB: [birth year]  Age: 47 y.o. MRN: [id number]  Chief Complaint  Patient presents with   Follow-up    F/u    Patient is here for follow up appointment.  She has decided to stay here for her primary care.   She has a few concerns today: She has a hard lump in her left palm, says that it has been there for a while, but that it has started to bother her.   She is willing to continue decreasing her pain medication, but needs Refills She asks if we can start with a decrease to 3 times per day.   Has referral to [facility] medical.  MRI is set up for her   Still needs the following referrals:  COVID Clinic [facility] Neurology - still needs referral for her previously found cerebellar lesion ENT - thyroid nodule  No other concerns at this time.   Past Medical History:  Diagnosis Date   Depression    Family history of breast cancer 05/27/2018   Fibromyalgia    Insomnia    Lung nodule    Lung nodule, solitary 09/04/2020   PTSD (post-traumatic stress disorder)    Strep pharyngitis 02/17/2021   Last Assessment & Plan: Formatting of this note might be different from the original. Will not check a rapid strep test for her sore throat due to her husband was positive for strep throat diagnosed yesterday in the same clinic.  Will prescribe Pen-Vee K 500 mg 1 every 12 hours for 10 days.  Be sure to drink plenty of fluids including Gatorade, take Tylenol or Advil for pain or fever as needed.  I   Stroke [facility])    Suspected COVID-19 virus infection 06/29/2020   Formatting of this note might be different from the original. Videl presents via virtual visit with complaints of cough with dark yellow and green mucous, nasal congestion and stuffiness, low grade fevers, sore throat, nausea, vomiting, and recent sick exposure. She had a recent negative covid test last week, and started Augmentin for a URI after her  negative Covid results.  She states she start   Thyroid nodule     Past Surgical History:  Procedure Laterality Date   APPENDECTOMY     CHOLECYSTECTOMY      Social History   Socioeconomic History   Marital status: Married    Spouse name: Not on file   Number of children: Not on file   Years of education: Not on file   Highest education level: Not on file  Occupational History   Not on file  Tobacco Use   Smoking status: Never    Passive exposure: Never   Smokeless tobacco: Never  Vaping Use   Vaping status: Never Used  Substance and Sexual Activity   Alcohol use: Not Currently    Comment: ocassionally   Drug use: Never   Sexual activity: Yes    Birth control/protection: None, Other-see comments    Comment: husband has vasectomy  Other Topics Concern   Not on file  Social History Narrative   Not on file   Social Determinants of Health   Financial Resource Strain: Not on file  Food Insecurity: Not on file  Transportation Needs: Not on file  Physical Activity: Not on file  Stress: Not on file  Social Connections: Unknown (05/08/2020)   Received from [place], [facility]   Social Connections  Frequency of Communication with Friends and Family: Not asked    Frequency of Social Gatherings with Friends and Family: Not asked  Intimate Partner Violence: Unknown (05/08/2020)   Received from [facility], [facility]   Intimate Partner Violence    Fear of Current or Ex-Partner: Not asked    Emotionally Abused: Not asked    Physically Abused: Not asked    Sexually Abused: Not asked    Family History  Problem Relation Age of Onset   Heart attack Father    Vascular Disease Father    Vascular Disease Maternal Uncle    Obesity Paternal Aunt    Vascular Disease Paternal Aunt    Heart attack Maternal Grandmother    Ovarian cancer Maternal Grandmother    Vascular Disease Maternal Grandfather    Stroke Paternal Grandmother     Allergies  Allergen  Reactions   Ciprofloxacin Nausea And Vomiting and Other (See Comments)    Neuropathy  Neuropathy  Neuropathy  Neuropathy  Neuropathy   Nitrofurantoin Hives    Other reaction(s): Hives/Swelling-Allergy   Promethazine Other (See Comments)    "Childhood," pt stated she has tolerated since   Sulfa Antibiotics Nausea And Vomiting, Other (See Comments) and Rash    CAUSES PAIN, PT REPORTS    Review of Systems  All other systems reviewed and are negative.      Objective:   BP 115/79   Pulse 60   Ht 5\' 5"  (1.651 m)   Wt 143 lb (64.9 kg)   LMP 06/07/2023 (Approximate) Comment: Pt states no chance of pregnancy  SpO2 97%   BMI 23.80 kg/m   Vitals:   06/19/23 1344  BP: 115/79  Pulse: 60  Height: 5\' 5"  (1.651 m)  Weight: 143 lb (64.9 kg)  SpO2: 97%  BMI (Calculated): 23.8    Physical Exam Vitals and nursing note reviewed.  Constitutional:      Appearance: Normal appearance. She is normal weight.  HENT:     Head: Normocephalic.  Eyes:     Extraocular Movements: Extraocular movements intact.     Pupils: Pupils are equal, round, and reactive to light.  Cardiovascular:     Rate and Rhythm: Normal rate.  Pulmonary:     Effort: Pulmonary effort is normal.  Musculoskeletal:        General: Normal range of motion.  Neurological:     General: No focal deficit present.     Mental Status: She is alert and oriented to person, place, and time. Mental status is at baseline.  Psychiatric:        Attention and Perception: Attention and perception normal.        Mood and Affect: Mood is anxious.        Speech: Speech normal.        Behavior: Behavior normal. Behavior is cooperative.        Thought Content: Thought content normal.        Cognition and Memory: Cognition and memory normal.        Judgment: Judgment normal.      No results found for any visits on 06/19/23.  Recent Results (from the past 2160 hour(s))  Hemoglobin A1c     Status: None   Collection Time:  06/02/23  9:08 AM  Result Value Ref Range   Hgb A1c MFr Bld 5.5 4.6 - 6.5 %    Comment: Glycemic Control Guidelines for People with Diabetes:Non Diabetic:  <6%Goal of Therapy: <7%Additional Action Suggested:  >8%  Thyroid Peroxidase Antibodies (TPO) (REFL)     Status: None   Collection Time: 06/02/23  9:08 AM  Result Value Ref Range   Thyroperoxidase Ab SerPl-aCnc <1 <9 IU/mL  T3, free     Status: None   Collection Time: 06/02/23  9:08 AM  Result Value Ref Range   T3, Free 3.3 2.3 - 4.2 pg/mL  T4, free     Status: None   Collection Time: 06/02/23  9:08 AM  Result Value Ref Range   Free T4 0.82 0.60 - 1.60 ng/dL    Comment: Specimens from patients who are undergoing biotin therapy and /or ingesting biotin supplements may contain high levels of biotin.  The higher biotin concentration in these specimens interferes with this Free T4 assay.  Specimens that contain high levels  of biotin may cause false high results for this Free T4 assay.  Please interpret results in light of the total clinical presentation of the patient.    TSH     Status: None   Collection Time: 06/02/23  9:08 AM  Result Value Ref Range   TSH 1.35 0.35 - 5.50 uIU/mL  Vitamin B12     Status: None   Collection Time: 06/02/23  9:08 AM  Result Value Ref Range   Vitamin B-12 222 211 - 911 pg/mL  C-reactive protein     Status: None   Collection Time: 06/02/23  9:08 AM  Result Value Ref Range   CRP <1.0 0.5 - 20.0 mg/dL  ANA Screen,IFA,Reflex Titer/Pattern,Reflex Mplx 11 Ab Cascade with IdentRA     Status: None   Collection Time: 06/02/23  9:08 AM  Result Value Ref Range   Anti Nuclear Antibody (ANA) NEGATIVE NEGATIVE    Comment: ANA IFA is a first line screen for detecting the presence of up to approximately 150 autoantibodies in various autoimmune diseases. A negative ANA IFA result suggests an ANA-associated autoimmune disease is not present at this time, and does not reflex further. If there is high clinical  suspicion for Sjogren's syndrome, testing for anti-SS-A/Ro antibody should be considered. Anti-Jo-1 antibody should be considered for clinically suspected inflammatory myopathies. . AC-0: Negative . International Consensus on ANA Patterns (SeverTies.uy) . For additional information, please refer to http://education.[facility].com/faq/FAQ177 (This link is being provided for informational/ educational purposes only.) .    Rheumatoid fact SerPl-aCnc <10 <14 IU/mL   Cyclic Citrullin Peptide Ab <16 UNITS    Comment: Reference Range Negative:            <20 Weak Positive:       20-39 Moderate Positive:   40-59 Strong Positive:     >59 .    MUTATED CITRULLINATED VIMENTIN (MCV) AB <20 <20 U/mL    Comment: . Anti-mutated citrullinated vimentin antibody may be used as a second-line marker of rheumatoid arthritis, in addition to rheumatoid factor and anti-cyclic citrullinated peptide (CCP). .   Sedimentation rate     Status: None   Collection Time: 06/02/23  9:08 AM  Result Value Ref Range   Sed Rate 13 0 - 20 mm/hr  DRUG MONITORING, PANEL 8 WITH CONFIRMATION, URINE     Status: Abnormal   Collection Time: 06/02/23  9:09 AM  Result Value Ref Range   Alcohol Metabolites NEGATIVE <500 ng/mL   Amphetamines POSITIVE (A) <500 ng/mL   Amphetamine >15,000 (H) <250 ng/mL   Methamphetamine NEGATIVE <250 ng/mL   Amphetamines Comments      Comment: See Amphetamines Notes, LDT Notes   Benzodiazepines POSITIVE (A) <  100 ng/mL   Alphahydroxyalprazolam NEGATIVE <25 ng/mL   Alphahydroxymidazolam NEGATIVE <50 ng/mL   Alphahydroxytriazolam NEGATIVE <50 ng/mL   Aminoclonazepam NEGATIVE <25 ng/mL   Hydroxyethylflurazepam NEGATIVE <50 ng/mL   Lorazepam >6,250 (H) <50 ng/mL   Nordiazepam NEGATIVE <50 ng/mL   Oxazepam NEGATIVE <50 ng/mL   Temazepam NEGATIVE <50 ng/mL   Benzodiazepines Comments      Comment: See Benzodiazepines Notes, LDT Notes   Buprenorphine,  Urine NEGATIVE <5 ng/mL   Cocaine Metabolite NEGATIVE <150 ng/mL   6 Acetylmorphine NEGATIVE <10 ng/mL   Marijuana Metabolite NEGATIVE <20 ng/mL   MDMA NEGATIVE <500 ng/mL   Opiates POSITIVE (A) <100 ng/mL   Codeine NEGATIVE <50 ng/mL   Hydrocodone 7,438 (H) <50 ng/mL   Hydromorphone 732 (H) <50 ng/mL   Morphine NEGATIVE <50 ng/mL   Norhydrocodone >10,000 (H) <50 ng/mL   Opiates Comments      Comment: See Opiates Notes, LDT Notes   Oxycodone NEGATIVE <100 ng/mL   Creatinine 283.8 > or = 20.0 mg/dL   pH 5.8 4.5 - 9.0   Oxidant NEGATIVE <200 mcg/mL  DM TEMPLATE     Status: None   Collection Time: 06/02/23  9:09 AM  Result Value Ref Range   Notes and Comments      Comment: This drug testing is for medical treatment only. Analysis was performed as non-forensic testing and these results should be used only by healthcare providers to render diagnosis or treatment, or to monitor progress of medical conditions. . Amphetamines Notes: Amphetamine detected is consistent with the use of the  drug Amphetamine. . Amphetamine can be a prescribed drug and is also a  metabolite of methamphetamine. . Benzodiazepines Notes: Lorazepam detected is consistent with the use of the  drug Lorazepam. . Opiates Notes: Hydrocodone, Norhydrocodone, Hydromorphone detected is  consistent with the use of the drug Hydrocodone. . Hydromorphone detected is consistent with the use of  the drug Hydromorphone. . Hydromorphone can be a prescribed drug and is also a  metabolite of Hydrocodone. Marland Kitchen LDT Notes: Confirmation tests were developed and their analytical  performance characteristics have been determined by  [employer]. It has not been cleared or app roved  by the FDA. This assay has been validated pursuant to  the CLIA regulations and is used for clinical purposes. . . Healthcare Providers needing Interpretation assistance,  please contact us at [phone] ([phone])  M-F, 8am  to 10pm EST   Rheumatoid factor     Status: None   Collection Time: 06/02/23  9:38 AM  Result Value Ref Range   Rheumatoid fact SerPl-aCnc <10 <14 IU/mL  POCT rapid strep A     Status: None   Collection Time: 06/14/23  8:19 AM  Result Value Ref Range   Rapid Strep A Screen Negative Negative       Assessment & Plan:   Problem List Items Addressed This Visit       Active Problems   Moderate episode of recurrent major depressive disorder (HCC)    Patient stable.  Well controlled with current therapy.   Continue current meds.        Fibromyalgia   Relevant Medications   HYDROcodone-acetaminophen (NORCO) 10-325 MG tablet   tiZANidine (ZANAFLEX) 4 MG tablet   Cerebellar lesion    Will set up referral to Neurology.  I will also see if I am able to pull records from [facility] for her MRI.       Relevant Orders   Ambulatory  referral to Neurology   Thyroid nodule    Sending referral to ENT for this and for her frequent sinus issues.       Relevant Orders   Ambulatory referral to ENT   Long COVID    Sending referral to [facility] Post COVID clinic.       Relevant Orders   Ambulatory referral to Physical Medicine Rehab   Other Visit Diagnoses     Nodule of skin of left hand    -  Primary   Chronic bilateral low back pain with bilateral sciatica       Relevant Medications   HYDROcodone-acetaminophen (NORCO) 10-325 MG tablet   tiZANidine (ZANAFLEX) 4 MG tablet   Chronic pansinusitis       Relevant Orders   Ambulatory referral to ENT       Return in about 1 month (around 07/20/2023) for F/U.   Total time spent: 30 minutes  Miki Kins, FNP  06/19/2023   This document may have been prepared by [facility] Voice Recognition software and as such may include unintentional dictation errors.

## 2023-06-20 ENCOUNTER — Other Ambulatory Visit: Payer: Self-pay | Admitting: Family

## 2023-06-20 ENCOUNTER — Telehealth: Payer: Self-pay | Admitting: Family

## 2023-06-20 DIAGNOSIS — M797 Fibromyalgia: Secondary | ICD-10-CM

## 2023-06-20 DIAGNOSIS — G8929 Other chronic pain: Secondary | ICD-10-CM

## 2023-06-20 MED ORDER — HYDROCODONE-ACETAMINOPHEN 10-325 MG PO TABS
1.0000 | ORAL_TABLET | Freq: Three times a day (TID) | ORAL | 0 refills | Status: DC | PRN
Start: 2023-06-20 — End: 2023-07-24

## 2023-06-20 NOTE — Telephone Encounter (Signed)
This is already approved.  Notes are in referral order. I spoke with [facility] with Preservice center and made her aware.   This has been approved Auth# [id number] Valid 06/20/2023-06/22/2023 Ref# [id number]

## 2023-06-20 NOTE — Telephone Encounter (Signed)
Is there anything we need to do in regards to dismissing her?  She is going back to her prior PCP Grayling Congress.

## 2023-06-20 NOTE — Telephone Encounter (Signed)
Melissa from [facility] Preservice department called stating patient needs PA for MRI brain with or without contrast scheduled for August 22,2024. She would like a phone call once reviewed PA has been or completed.   [id number]

## 2023-06-22 ENCOUNTER — Ambulatory Visit: Admission: RE | Admit: 2023-06-22 | Payer: Commercial Managed Care - PPO | Source: Ambulatory Visit

## 2023-06-26 ENCOUNTER — Ambulatory Visit: Payer: Self-pay

## 2023-07-08 ENCOUNTER — Encounter: Payer: Self-pay | Admitting: Family

## 2023-07-08 DIAGNOSIS — U099 Post covid-19 condition, unspecified: Secondary | ICD-10-CM | POA: Insufficient documentation

## 2023-07-08 NOTE — Assessment & Plan Note (Addendum)
Will set up referral to Neurology.  I will also see if I am able to pull records from [facility] for her MRI.

## 2023-07-08 NOTE — Assessment & Plan Note (Signed)
Patient stable.  Well controlled with current therapy.   Continue current meds.  

## 2023-07-08 NOTE — Assessment & Plan Note (Signed)
Sending referral to [facility] Post COVID clinic.

## 2023-07-08 NOTE — Addendum Note (Signed)
Addended by: Grayling Congress on: 07/08/2023 08:26 PM   Modules accepted: Orders

## 2023-07-08 NOTE — Assessment & Plan Note (Signed)
Sending referral to ENT for this and for her frequent sinus issues.

## 2023-07-14 ENCOUNTER — Other Ambulatory Visit: Payer: Self-pay

## 2023-07-14 DIAGNOSIS — M797 Fibromyalgia: Secondary | ICD-10-CM

## 2023-07-14 DIAGNOSIS — G8929 Other chronic pain: Secondary | ICD-10-CM

## 2023-07-14 MED ORDER — TIZANIDINE HCL 4 MG PO TABS: 4.0000 mg | ORAL_TABLET | Freq: Three times a day (TID) | ORAL | 1 refills | Status: DC

## 2023-07-17 ENCOUNTER — Other Ambulatory Visit: Payer: Self-pay | Admitting: Family

## 2023-07-18 ENCOUNTER — Ambulatory Visit: Payer: Commercial Managed Care - PPO | Admitting: Family

## 2023-07-20 ENCOUNTER — Ambulatory Visit: Payer: Commercial Managed Care - PPO | Admitting: Family

## 2023-07-20 ENCOUNTER — Other Ambulatory Visit: Payer: Self-pay | Admitting: Family

## 2023-07-23 ENCOUNTER — Emergency Department: Payer: Commercial Managed Care - PPO

## 2023-07-23 ENCOUNTER — Other Ambulatory Visit: Payer: Self-pay

## 2023-07-23 ENCOUNTER — Emergency Department
Admission: EM | Admit: 2023-07-23 | Discharge: 2023-07-23 | Disposition: A | Discharge status: Home / Self Care | Payer: Commercial Managed Care - PPO | Attending: Emergency Medicine | Admitting: Emergency Medicine

## 2023-07-23 DIAGNOSIS — L509 Urticaria, unspecified: Secondary | ICD-10-CM | POA: Diagnosis not present

## 2023-07-23 DIAGNOSIS — Z20822 Contact with and (suspected) exposure to covid-19: Secondary | ICD-10-CM | POA: Insufficient documentation

## 2023-07-23 DIAGNOSIS — R0789 Other chest pain: Secondary | ICD-10-CM | POA: Diagnosis present

## 2023-07-23 LAB — BASIC METABOLIC PANEL
Anion gap: 7 (ref 5–15)
BUN: 12 mg/dL (ref 6–20)
CO2: 25 mmol/L (ref 22–32)
Calcium: 9.7 mg/dL (ref 8.9–10.3)
Chloride: 103 mmol/L (ref 98–111)
Creatinine, Ser: 0.7 mg/dL (ref 0.44–1.00)
GFR, Estimated: >60 mL/min (ref >60)
Glucose, Bld: 111 mg/dL — ABNORMAL HIGH (ref 70–99)
Potassium: 4.3 mmol/L (ref 3.5–5.1)
Sodium: 135 mmol/L (ref 135–145)

## 2023-07-23 LAB — CBC
HCT: 38.5 % (ref 36.0–46.0)
Hemoglobin: 13.1 g/dL (ref 12.0–15.0)
MCH: 31.4 pg (ref 26.0–34.0)
MCHC: 34 g/dL (ref 30.0–36.0)
MCV: 92.3 fL (ref 80.0–100.0)
Platelets: 260 10*3/uL (ref 150–400)
RBC: 4.17 MIL/uL (ref 3.87–5.11)
RDW: 11.8 % (ref 11.5–15.5)
WBC: 8.9 10*3/uL (ref 4.0–10.5)
nRBC: 0 % (ref 0.0–0.2)

## 2023-07-23 LAB — SARS CORONAVIRUS 2 BY RT PCR: SARS Coronavirus 2 by RT PCR: NEGATIVE

## 2023-07-23 LAB — TROPONIN I (HIGH SENSITIVITY): Troponin I (High Sensitivity): <2 ng/L (ref <18)

## 2023-07-23 MED ORDER — OMEPRAZOLE MAGNESIUM 20 MG PO TBEC: 20.0000 mg | DELAYED_RELEASE_TABLET | Freq: Every day | ORAL | 2 refills | Status: DC

## 2023-07-23 MED ORDER — HYDROXYZINE HCL 25 MG PO TABS
25.0000 mg | ORAL_TABLET | Freq: Once | ORAL | Status: AC
  Administered 2023-07-23: 25 mg via ORAL
  Filled 2023-07-23: qty 1

## 2023-07-23 NOTE — Discharge Instructions (Addendum)
You are seen in the emergency department for chest pain.  Your heart enzyme was normal, do not believe that you are having a heart attack today.  Your chest x-ray was normal.  Your vital signs were normal.  No concern for blood clot.  You can alternate Atarax and Benadryl for your itching for your hives.  You can take Tylenol 1000 mg every 6 hours as needed for pain control.  You are given a prescription for an acid reducing medication, take this as prescribed.  Follow-up closely with your primary care physician and with dermatology.  Return to the emergency department for any worsening symptoms  Thank you for choosing [place] for your health care, it was my pleasure to care for you today!  Corena Herter, MD

## 2023-07-23 NOTE — ED Notes (Addendum)
See triage notes. Patient c/o hives (times five weeks), sore throat and chest pain times 5 days.

## 2023-07-23 NOTE — ED Provider Notes (Signed)
[facility] Provider Note    Event Date/Time   First MD Initiated Contact with Patient 07/23/23 1422     (approximate)   History   Chest Pain   HPI  Sandra Aguilar is a 47 y.o. female past medical history significant for fibromyalgia, chronic hives, who presents to the emergency department with chest pain.  Chest pain that has been ongoing for the past 5 days.  Substernal chest pressure sensation that does not change with position.  Nothing improves or worsens.  No change with food.  Denies any significant shortness of breath but does endorse a cough.  No history of DVT or PE.  No swelling to her legs.  Endorses intermittent episodes of hives which have been ongoing for a while.  Has not been evaluated by dermatology.  Denies any recent travel.  No tobacco use.     Physical Exam   Triage Vital Signs: ED Triage Vitals  Encounter Vitals Group     BP 07/23/23 1346 100/65     Systolic BP Percentile --      Diastolic BP Percentile --      Pulse Rate 07/23/23 1346 86     Resp 07/23/23 1346 16     Temp 07/23/23 1346 98 F (36.7 C)     Temp src --      SpO2 07/23/23 1346 98 %     Weight 07/23/23 1430 143 lb 1.3 oz (64.9 kg)     Height 07/23/23 1430 5\' 5"  (1.651 m)     Head Circumference --      Peak Flow --      Pain Score 07/23/23 1348 5     Pain Loc --      Pain Education --      Exclude from Growth Chart --     Most recent vital signs: Vitals:   07/23/23 1346  BP: 100/65  Pulse: 86  Resp: 16  Temp: 98 F (36.7 C)  SpO2: 98%    Physical Exam Constitutional:      Appearance: She is well-developed.  HENT:     Head: Atraumatic.  Eyes:     Conjunctiva/sclera: Conjunctivae normal.  Cardiovascular:     Rate and Rhythm: Regular rhythm.     Heart sounds: Normal heart sounds.  Pulmonary:     Effort: No respiratory distress.  Abdominal:     General: There is no distension.     Palpations: Abdomen is soft.     Tenderness: There is no  abdominal tenderness.  Musculoskeletal:        General: Normal range of motion.     Cervical back: Normal range of motion.     Right lower leg: No edema.     Left lower leg: No edema.  Skin:    General: Skin is warm.     Capillary Refill: Capillary refill takes less than 2 seconds.     Findings: Rash present.     Comments: Area of hives to the right upper back  Neurological:     Mental Status: She is alert. Mental status is at baseline.     IMPRESSION / MDM / ASSESSMENT AND PLAN / ED COURSE  I reviewed the triage vital signs and the nursing notes.  Differential diagnosis including gastritis/GERD, stress, ACS, pneumonia, pericarditis, pulmonary embolism  EKG  I, Corena Herter, the attending physician, personally viewed and interpreted this ECG.   Rate: Normal  Rhythm: Normal sinus  Axis: Normal  Intervals: Normal  ST&T Change: None  No tachycardic or bradycardic dysrhythmias while on cardiac telemetry.  RADIOLOGY I independently reviewed imaging, my interpretation of imaging: Chest x-ray no signs of pneumonia  LABS (all labs ordered are listed, but only abnormal results are displayed) Labs interpreted as -    Labs Reviewed  BASIC METABOLIC PANEL - Abnormal; Notable for the following components:      Result Value   Glucose, Bld 111 (*)    All other components within normal limits  SARS CORONAVIRUS 2 BY RT PCR  CBC  POC URINE PREG, ED  TROPONIN I (HIGH SENSITIVITY)  TROPONIN I (HIGH SENSITIVITY)     MDM  Low risk Wells criteria, PERC negative, have low suspicion for pulmonary embolism.  No signs of pneumonia.  No significant anemia.  No concern for pregnancy.  No significant electrolyte abnormality.  EKG and exam/clinical picture is not consistent with acute pericarditis.  No concern for dissection.  Given Atarax for hives and itching.  Discussed follow-up as an outpatient with dermatology and possible need to be evaluated by an allergy specialist.  Discussed  Benadryl and Atarax as needed for the itching.  Will restart the patient on her PPI for acid reflux symptoms.  Discussed follow-up with primary care physician and return precautions for any worsening symptoms.     PROCEDURES:  Critical Care performed: No  Procedures  Patient's presentation is most consistent with acute presentation with potential threat to life or bodily function.   MEDICATIONS ORDERED IN ED: Medications  hydrOXYzine (ATARAX) tablet 25 mg (25 mg Oral Given 07/23/23 1500)    FINAL CLINICAL IMPRESSION(S) / ED DIAGNOSES   Final diagnoses:  Atypical chest pain     Rx / DC Orders   ED Discharge Orders          Ordered    omeprazole (PRILOSEC OTC) 20 MG tablet  Daily        07/23/23 1457             Note:  This document was prepared using Dragon voice recognition software and may include unintentional dictation errors.   Corena Herter, MD 07/23/23 1527

## 2023-07-23 NOTE — ED Triage Notes (Signed)
Pt comes with c/o cp for few days. Pt also states hives all over, cough and sore throat. Pt states she has autoimmune disorder.

## 2023-07-23 NOTE — ED Notes (Signed)
Pt d/c home per MD order. Discharge summary reviewed, pt verbalizes understanding. Ambulatory off unit. No s/s of acute distress noted at discharge.

## 2023-07-24 MED ORDER — HYDROCODONE-ACETAMINOPHEN 10-325 MG PO TABS: 1.0000 | ORAL_TABLET | Freq: Three times a day (TID) | ORAL | 0 refills | Status: DC | PRN

## 2023-07-24 MED ORDER — HYOSCYAMINE SULFATE 0.125 MG SL SUBL: 0.1250 mg | SUBLINGUAL_TABLET | SUBLINGUAL | 0 refills | Status: AC | PRN

## 2023-07-25 ENCOUNTER — Ambulatory Visit: Payer: Commercial Managed Care - PPO | Admitting: Family

## 2023-07-25 ENCOUNTER — Other Ambulatory Visit: Payer: Self-pay

## 2023-07-25 ENCOUNTER — Encounter: Payer: Self-pay | Admitting: Family

## 2023-07-25 VITALS — BP 135/70 | HR 90 | Wt 134.6 lb

## 2023-07-25 DIAGNOSIS — M2550 Pain in unspecified joint: Secondary | ICD-10-CM

## 2023-07-25 DIAGNOSIS — M797 Fibromyalgia: Secondary | ICD-10-CM | POA: Diagnosis not present

## 2023-07-25 DIAGNOSIS — R42 Dizziness and giddiness: Secondary | ICD-10-CM

## 2023-07-25 DIAGNOSIS — L509 Urticaria, unspecified: Secondary | ICD-10-CM | POA: Diagnosis not present

## 2023-07-25 LAB — POCT XPERT XPRESS SARS COVID-2/FLU/RSV
FLU A: NEGATIVE
FLU B: NEGATIVE
RSV RNA, PCR: NEGATIVE
SARS Coronavirus 2: NEGATIVE

## 2023-07-25 NOTE — Progress Notes (Signed)
Established Patient Office Visit  Subjective:  Patient ID: Sandra Aguilar, female    DOB: Jan 03, 1976  Age: 47 y.o. MRN: [id number]  Chief Complaint  Patient presents with   Rash    Breaking out in hive- 5 weeks, cough sore throat, runny nose, headache    Breaking out in hives.   Started on legs, had spread to back.   Also having dizzy spells, severe weakness, and fatigue still.  Needs refills on her medications.    Rash This is a recurrent problem. The current episode started 1 to 4 weeks ago. The problem has been waxing and waning since onset. The affected locations include the back, chest, left upper leg, left lower leg, right upper leg and right lower leg. The rash is characterized by redness and itchiness. She was exposed to nothing. Associated symptoms include fatigue. Past treatments include topical steroids, moisturizer, cold compress, antihistamine, anti-itch cream and analgesics. The treatment provided no relief.    No other concerns at this time.   Past Medical History:  Diagnosis Date   Depression    Family history of breast cancer 05/27/2018   Fibromyalgia    Insomnia    Lung nodule    Lung nodule, solitary 09/04/2020   PTSD (post-traumatic stress disorder)    Strep pharyngitis 02/17/2021   Last Assessment & Plan: Formatting of this note might be different from the original. Will not check a rapid strep test for her sore throat due to her husband was positive for strep throat diagnosed yesterday in the same clinic.  Will prescribe Pen-Vee K 500 mg 1 every 12 hours for 10 days.  Be sure to drink plenty of fluids including Gatorade, take Tylenol or Advil for pain or fever as needed.  I   Stroke [facility])    Suspected COVID-19 virus infection 06/29/2020   Formatting of this note might be different from the original. Zanobia presents via virtual visit with complaints of cough with dark yellow and green mucous, nasal congestion and stuffiness, low grade fevers, sore throat,  nausea, vomiting, and recent sick exposure. She had a recent negative covid test last week, and started Augmentin for a URI after her negative Covid results.  She states she start   Thyroid nodule     Past Surgical History:  Procedure Laterality Date   APPENDECTOMY     CHOLECYSTECTOMY      Social History   Socioeconomic History   Marital status: Married    Spouse name: Not on file   Number of children: Not on file   Years of education: Not on file   Highest education level: Not on file  Occupational History   Not on file  Tobacco Use   Smoking status: Never    Passive exposure: Never   Smokeless tobacco: Never  Vaping Use   Vaping status: Never Used  Substance and Sexual Activity   Alcohol use: Not Currently    Comment: ocassionally   Drug use: Never   Sexual activity: Yes    Birth control/protection: None, Other-see comments    Comment: husband has vasectomy  Other Topics Concern   Not on file  Social History Narrative   Not on file   Social Determinants of Health   Financial Resource Strain: Not on file  Food Insecurity: Not on file  Transportation Needs: Not on file  Physical Activity: Not on file  Stress: Not on file  Social Connections: Unknown (05/08/2020)   Received from [facility], [facility]  Social Connections    Frequency of Communication with Friends and Family: Not asked    Frequency of Social Gatherings with Friends and Family: Not asked  Intimate Partner Violence: Unknown (05/08/2020)   Received from [facility], [facility]   Intimate Partner Violence    Fear of Current or Ex-Partner: Not asked    Emotionally Abused: Not asked    Physically Abused: Not asked    Sexually Abused: Not asked    Family History  Problem Relation Age of Onset   Heart attack Father    Vascular Disease Father    Vascular Disease Maternal Uncle    Obesity Paternal Aunt    Vascular Disease Paternal Aunt    Heart attack Maternal Grandmother    Ovarian  cancer Maternal Grandmother    Vascular Disease Maternal Grandfather    Stroke Paternal Grandmother     Allergies  Allergen Reactions   Ciprofloxacin Nausea And Vomiting and Other (See Comments)    Neuropathy  Neuropathy  Neuropathy  Neuropathy  Neuropathy   Nitrofurantoin Hives    Other reaction(s): Hives/Swelling-Allergy   Promethazine Other (See Comments)    "Childhood," pt stated she has tolerated since   Sulfa Antibiotics Nausea And Vomiting, Other (See Comments) and Rash    CAUSES PAIN, PT REPORTS    Review of Systems  Constitutional:  Positive for fatigue.  Skin:  Positive for rash.       Objective:   BP 135/70   Pulse 90   Wt 134 lb 9.6 oz (61.1 kg)   LMP 07/02/2023 (Approximate)   SpO2 95%   BMI 22.40 kg/m   Vitals:   07/25/23 1357  BP: 135/70  Pulse: 90  Weight: 134 lb 9.6 oz (61.1 kg)  SpO2: 95%  BMI (Calculated): 22.4    Physical Exam   Results for orders placed or performed in visit on 07/25/23  ANA 12 Profile, Do All RDL  Result Value Ref Range   Anti-Nuclear Ab by IFA (RDL) Positive (A) Negative   Anti-Centromere Ab (RDL) <1:40 <1:40   Anti-dsDNA Ab by Farr(RDL) <8.0 <8.0 IU/mL   Anti-Sm Ab (RDL) <20 <20 Units   Anti-U1 RNP Ab (RDL) <20 <20 Units   Anti-Ro (SS-A) Ab (RDL) <20 <20 Units   Anti-La (SS-B) Ab (RDL) <20 <20 Units   Anti-Scl-70 Ab (RDL) 34 (H) <20 Units   Anti-Cardiolipin Ab, IgG (RDL) <15 <15 GPL U/mL   Anti-Cardiolipin Ab, IgA (RDL) <12 <12 APL U/mL   Anti-Cardiolipin Ab, IgM (RDL) <13 <13 MPL U/mL   C3 Complement (RDL) 166 82 - 167 mg/dL   C4 Complement (RDL) 40 14 - 44 mg/dL   Anti-TPO Ab (RDL) <1.6 <9.0 IU/mL  CBC With Diff/Platelet  Result Value Ref Range   WBC 7.8 3.4 - 10.8 x10E3/uL   RBC 4.20 3.77 - 5.28 x10E6/uL   Hemoglobin 13.5 11.1 - 15.9 g/dL   Hematocrit 10.9 60.4 - 46.6 %   MCV 94 79 - 97 fL   MCH 32.1 26.6 - 33.0 pg   MCHC 34.1 31.5 - 35.7 g/dL   RDW 54.0 98.1 - 19.1 %   Platelets 295 150 - 450  x10E3/uL   Neutrophils 56 Not Estab. %   Lymphs 34 Not Estab. %   Monocytes 7 Not Estab. %   Eos 2 Not Estab. %   Basos 1 Not Estab. %   Neutrophils Absolute 4.4 1.4 - 7.0 x10E3/uL   Lymphocytes Absolute 2.6 0.7 - 3.1 x10E3/uL  Monocytes Absolute 0.6 0.1 - 0.9 x10E3/uL   EOS (ABSOLUTE) 0.2 0.0 - 0.4 x10E3/uL   Basophils Absolute 0.1 0.0 - 0.2 x10E3/uL   Immature Granulocytes 0 Not Estab. %   Immature Grans (Abs) 0.0 0.0 - 0.1 x10E3/uL  CMP14+EGFR  Result Value Ref Range   Glucose 122 (H) 70 - 99 mg/dL   BUN 10 6 - 24 mg/dL   Creatinine, Ser 1.61 0.57 - 1.00 mg/dL   eGFR 97 >09 UE/AVW/0.98   BUN/Creatinine Ratio 13 9 - 23   Sodium 138 134 - 144 mmol/L   Potassium 4.5 3.5 - 5.2 mmol/L   Chloride 101 96 - 106 mmol/L   CO2 23 20 - 29 mmol/L   Calcium 9.8 8.7 - 10.2 mg/dL   Total Protein 7.3 6.0 - 8.5 g/dL   Albumin 4.5 3.9 - 4.9 g/dL   Globulin, Total 2.8 1.5 - 4.5 g/dL   Bilirubin Total 0.2 0.0 - 1.2 mg/dL   Alkaline Phosphatase 49 44 - 121 IU/L   AST 12 0 - 40 IU/L   ALT 8 0 - 32 IU/L  ANA Titer and Pattern  Result Value Ref Range   Speckled Pattern 1:40 (H) <1:40   Note: Comment   Repeat Anti-Scl-70  Result Value Ref Range   Repeat Anti-Scl-70 Negative Negative  POCT XPERT XPRESS SARS COVID-2/FLU/RSV [[id number]]  Result Value Ref Range   SARS Coronavirus 2 Negative    FLU A Negative    FLU B Negative    RSV RNA, PCR Negative     Recent Results (from the past 2160 hour(s))  Hemoglobin A1c     Status: None   Collection Time: 06/02/23  9:08 AM  Result Value Ref Range   Hgb A1c MFr Bld 5.5 4.6 - 6.5 %    Comment: Glycemic Control Guidelines for People with Diabetes:Non Diabetic:  <6%Goal of Therapy: <7%Additional Action Suggested:  >8%   Thyroid Peroxidase Antibodies (TPO) (REFL)     Status: None   Collection Time: 06/02/23  9:08 AM  Result Value Ref Range   Thyroperoxidase Ab SerPl-aCnc <1 <9 IU/mL  T3, free     Status: None   Collection Time: 06/02/23  9:08 AM   Result Value Ref Range   T3, Free 3.3 2.3 - 4.2 pg/mL  T4, free     Status: None   Collection Time: 06/02/23  9:08 AM  Result Value Ref Range   Free T4 0.82 0.60 - 1.60 ng/dL    Comment: Specimens from patients who are undergoing biotin therapy and /or ingesting biotin supplements may contain high levels of biotin.  The higher biotin concentration in these specimens interferes with this Free T4 assay.  Specimens that contain high levels  of biotin may cause false high results for this Free T4 assay.  Please interpret results in light of the total clinical presentation of the patient.    TSH     Status: None   Collection Time: 06/02/23  9:08 AM  Result Value Ref Range   TSH 1.35 0.35 - 5.50 uIU/mL  Vitamin B12     Status: None   Collection Time: 06/02/23  9:08 AM  Result Value Ref Range   Vitamin B-12 222 211 - 911 pg/mL  C-reactive protein     Status: None   Collection Time: 06/02/23  9:08 AM  Result Value Ref Range   CRP <1.0 0.5 - 20.0 mg/dL  ANA Screen,IFA,Reflex Titer/Pattern,Reflex Mplx 11 Ab Cascade with [employer]  Status: None   Collection Time: 06/02/23  9:08 AM  Result Value Ref Range   Anti Nuclear Antibody (ANA) NEGATIVE NEGATIVE    Comment: ANA IFA is a first line screen for detecting the presence of up to approximately 150 autoantibodies in various autoimmune diseases. A negative ANA IFA result suggests an ANA-associated autoimmune disease is not present at this time, and does not reflex further. If there is high clinical suspicion for Sjogren's syndrome, testing for anti-SS-A/Ro antibody should be considered. Anti-Jo-1 antibody should be considered for clinically suspected inflammatory myopathies. . AC-0: Negative . International Consensus on ANA Patterns (SeverTies.uy) . For additional information, please refer to http://education.[facility].com/faq/FAQ177 (This link is being provided for informational/ educational purposes  only.) .    Rheumatoid fact SerPl-aCnc <10 <14 IU/mL   Cyclic Citrullin Peptide Ab <36 UNITS    Comment: Reference Range Negative:            <20 Weak Positive:       20-39 Moderate Positive:   40-59 Strong Positive:     >59 .    MUTATED CITRULLINATED VIMENTIN (MCV) AB <20 <20 U/mL    Comment: . Anti-mutated citrullinated vimentin antibody may be used as a second-line marker of rheumatoid arthritis, in addition to rheumatoid factor and anti-cyclic citrullinated peptide (CCP). .   Sedimentation rate     Status: None   Collection Time: 06/02/23  9:08 AM  Result Value Ref Range   Sed Rate 13 0 - 20 mm/hr  DRUG MONITORING, PANEL 8 WITH CONFIRMATION, URINE     Status: Abnormal   Collection Time: 06/02/23  9:09 AM  Result Value Ref Range   Alcohol Metabolites NEGATIVE <500 ng/mL   Amphetamines POSITIVE (A) <500 ng/mL   Amphetamine >15,000 (H) <250 ng/mL   Methamphetamine NEGATIVE <250 ng/mL   Amphetamines Comments      Comment: See Amphetamines Notes, LDT Notes   Benzodiazepines POSITIVE (A) <100 ng/mL   Alphahydroxyalprazolam NEGATIVE <25 ng/mL   Alphahydroxymidazolam NEGATIVE <50 ng/mL   Alphahydroxytriazolam NEGATIVE <50 ng/mL   Aminoclonazepam NEGATIVE <25 ng/mL   Hydroxyethylflurazepam NEGATIVE <50 ng/mL   Lorazepam >6,250 (H) <50 ng/mL   Nordiazepam NEGATIVE <50 ng/mL   Oxazepam NEGATIVE <50 ng/mL   Temazepam NEGATIVE <50 ng/mL   Benzodiazepines Comments      Comment: See Benzodiazepines Notes, LDT Notes   Buprenorphine, Urine NEGATIVE <5 ng/mL   Cocaine Metabolite NEGATIVE <150 ng/mL   6 Acetylmorphine NEGATIVE <10 ng/mL   Marijuana Metabolite NEGATIVE <20 ng/mL   MDMA NEGATIVE <500 ng/mL   Opiates POSITIVE (A) <100 ng/mL   Codeine NEGATIVE <50 ng/mL   Hydrocodone 7,438 (H) <50 ng/mL   Hydromorphone 732 (H) <50 ng/mL   Morphine NEGATIVE <50 ng/mL   Norhydrocodone >10,000 (H) <50 ng/mL   Opiates Comments      Comment: See Opiates Notes, LDT Notes   Oxycodone  NEGATIVE <100 ng/mL   Creatinine 283.8 > or = 20.0 mg/dL   pH 5.8 4.5 - 9.0   Oxidant NEGATIVE <200 mcg/mL  DM TEMPLATE     Status: None   Collection Time: 06/02/23  9:09 AM  Result Value Ref Range   Notes and Comments      Comment: This drug testing is for medical treatment only. Analysis was performed as non-forensic testing and these results should be used only by healthcare providers to render diagnosis or treatment, or to monitor progress of medical conditions. . Amphetamines Notes: Amphetamine detected is consistent with the use of  the  drug Amphetamine. . Amphetamine can be a prescribed drug and is also a  metabolite of methamphetamine. . Benzodiazepines Notes: Lorazepam detected is consistent with the use of the  drug Lorazepam. . Opiates Notes: Hydrocodone, Norhydrocodone, Hydromorphone detected is  consistent with the use of the drug Hydrocodone. . Hydromorphone detected is consistent with the use of  the drug Hydromorphone. . Hydromorphone can be a prescribed drug and is also a  metabolite of Hydrocodone. Marland Kitchen LDT Notes: Confirmation tests were developed and their analytical  performance characteristics have been determined by  [employer]. It has not been cleared or app roved  by the FDA. This assay has been validated pursuant to  the CLIA regulations and is used for clinical purposes. . . Healthcare Providers needing Interpretation assistance,  please contact us at [phone] ([phone])  M-F, 8am to 10pm EST   Rheumatoid factor     Status: None   Collection Time: 06/02/23  9:38 AM  Result Value Ref Range   Rheumatoid fact SerPl-aCnc <10 <14 IU/mL  POCT rapid strep A     Status: None   Collection Time: 06/14/23  8:19 AM  Result Value Ref Range   Rapid Strep A Screen Negative Negative  Basic metabolic panel     Status: Abnormal   Collection Time: 07/23/23  1:49 PM  Result Value Ref Range   Sodium 135 135 - 145 mmol/L   Potassium 4.3  3.5 - 5.1 mmol/L   Chloride 103 98 - 111 mmol/L   CO2 25 22 - 32 mmol/L   Glucose, Bld 111 (H) 70 - 99 mg/dL    Comment: Glucose reference range applies only to samples taken after fasting for at least 8 hours.   BUN 12 6 - 20 mg/dL   Creatinine, Ser 1.61 0.44 - 1.00 mg/dL   Calcium 9.7 8.9 - 09.6 mg/dL   GFR, Estimated >04 >54 mL/min    Comment: (NOTE) Calculated using the CKD-EPI Creatinine Equation (2021)    Anion gap 7 5 - 15    Comment: Performed at [facility], [address] [place], [place] [address]  CBC     Status: None   Collection Time: 07/23/23  1:49 PM  Result Value Ref Range   WBC 8.9 4.0 - 10.5 K/uL   RBC 4.17 3.87 - 5.11 MIL/uL   Hemoglobin 13.1 12.0 - 15.0 g/dL   HCT 91.4 78.2 - 95.6 %   MCV 92.3 80.0 - 100.0 fL   MCH 31.4 26.0 - 34.0 pg   MCHC 34.0 30.0 - 36.0 g/dL   RDW 21.3 08.6 - 57.8 %   Platelets 260 150 - 400 K/uL   nRBC 0.0 0.0 - 0.2 %    Comment: Performed at [facility], [address] [place], [place] [address]  Troponin I (High Sensitivity)     Status: None   Collection Time: 07/23/23  1:49 PM  Result Value Ref Range   Troponin I (High Sensitivity) <2 <18 ng/L    Comment: (NOTE) Elevated high sensitivity troponin I (hsTnI) values and significant  changes across serial measurements may suggest ACS but many other  chronic and acute conditions are known to elevate hsTnI results.  Refer to the "Links" section for chest pain algorithms and additional  guidance. Performed at [facility], [address] [place], [place] [address]   SARS Coronavirus 2 by RT PCR (hospital order, performed in [facility] hospital lab) *cepheid single result test* Anterior Nasal Swab  Status: None   Collection Time: 07/23/23  3:00 PM   Specimen: Anterior Nasal Swab  Result Value Ref Range   SARS Coronavirus 2 by RT PCR NEGATIVE NEGATIVE    Comment: (NOTE) SARS-CoV-2 target nucleic acids are NOT DETECTED.  The SARS-CoV-2 RNA is  generally detectable in upper and lower respiratory specimens during the acute phase of infection. The lowest concentration of SARS-CoV-2 viral copies this assay can detect is 250 copies / mL. A negative result does not preclude SARS-CoV-2 infection and should not be used as the sole basis for treatment or other patient management decisions.  A negative result may occur with improper specimen collection / handling, submission of specimen other than nasopharyngeal swab, presence of viral mutation(s) within the areas targeted by this assay, and inadequate number of viral copies (<250 copies / mL). A negative result must be combined with clinical observations, patient history, and epidemiological information.  Fact Sheet for Patients:   RoadLapTop.co.za  Fact Sheet for Healthcare Providers: http://kim-miller.com/  This test is not yet approved or  cleared by the [place] FDA and has been authorized for detection and/or diagnosis of SARS-CoV-2 by FDA under an Emergency Use Authorization (EUA).  This EUA will remain in effect (meaning this test can be used) for the duration of the COVID-19 declaration under Section 564(b)(1) of the Act, 21 U.S.C. section 360bbb-3(b)(1), unless the authorization is terminated or revoked sooner.  Performed at [facility], [address] [place], [place] [address]   ANA 12 Profile, Do All RDL     Status: Abnormal   Collection Time: 07/25/23  2:38 PM  Result Value Ref Range   Anti-Nuclear Ab by IFA (RDL) Positive (A) Negative   Anti-Centromere Ab (RDL) <1:40 <1:40   Anti-dsDNA Ab by Farr(RDL) <8.0 <8.0 IU/mL   Anti-Sm Ab (RDL) <20 <20 Units   Anti-U1 RNP Ab (RDL) <20 <20 Units   Anti-Ro (SS-A) Ab (RDL) <20 <20 Units   Anti-La (SS-B) Ab (RDL) <20 <20 Units   Anti-Scl-70 Ab (RDL) 34 (H) <20 Units   Anti-Cardiolipin Ab, IgG (RDL) <15 <15 GPL U/mL   Anti-Cardiolipin Ab, IgA (RDL) <12 <12 APL  U/mL   Anti-Cardiolipin Ab, IgM (RDL) <13 <13 MPL U/mL   C3 Complement (RDL) 166 82 - 167 mg/dL   C4 Complement (RDL) 40 14 - 44 mg/dL   Anti-TPO Ab (RDL) <3.0 <9.0 IU/mL    Comment:    Interpretation for Anti-Sm, Anti-U1 RNP, Anti-Ro,     Anti-La:         Negative:                                <20         Weak Positive:                       20 - 39         Moderate Positive:                   40 - 80         Strong Positive:                         >80         Strong Positive:                         >  59    Interpretation for Anti-Cardiolipin Ab:     Negative:               GPL <15, APL <12, MPL <13     Indeterminate:    GPL 15-20, APL 12-20, MPL 13-20     Low Positive:           GPL, APL, MPL    >20 - 40     Med Positive:           GPL, APL, MPL    >40 - 80     High Positive:          GPL, APL, MPL         >80 SLE classification criteria are based on Med to High titer (>40) Anti-Cardiolipin Ab (aCL). aCL may be elevated transiently with certain infections and may increase spuriously in the presence of rheumatoid factor.   CBC With Diff/Platelet     Status: None   Collection Time: 07/25/23  2:38 PM  Result Value Ref Range   WBC 7.8 3.4 - 10.8 x10E3/uL   RBC 4.20 3.77 - 5.28 x10E6/uL   Hemoglobin 13.5 11.1 - 15.9 g/dL   Hematocrit 54.0 98.1 - 46.6 %   MCV 94 79 - 97 fL   MCH 32.1 26.6 - 33.0 pg   MCHC 34.1 31.5 - 35.7 g/dL   RDW 19.1 47.8 - 29.5 %   Platelets 295 150 - 450 x10E3/uL   Neutrophils 56 Not Estab. %   Lymphs 34 Not Estab. %   Monocytes 7 Not Estab. %   Eos 2 Not Estab. %   Basos 1 Not Estab. %   Neutrophils Absolute 4.4 1.4 - 7.0 x10E3/uL   Lymphocytes Absolute 2.6 0.7 - 3.1 x10E3/uL   Monocytes Absolute 0.6 0.1 - 0.9 x10E3/uL   EOS (ABSOLUTE) 0.2 0.0 - 0.4 x10E3/uL   Basophils Absolute 0.1 0.0 - 0.2 x10E3/uL   Immature Granulocytes 0 Not Estab. %   Immature Grans (Abs) 0.0 0.0 - 0.1 x10E3/uL  CMP14+EGFR     Status: Abnormal   Collection Time: 07/25/23   2:38 PM  Result Value Ref Range   Glucose 122 (H) 70 - 99 mg/dL   BUN 10 6 - 24 mg/dL   Creatinine, Ser 6.21 0.57 - 1.00 mg/dL   eGFR 97 >30 QM/VHQ/4.69   BUN/Creatinine Ratio 13 9 - 23   Sodium 138 134 - 144 mmol/L   Potassium 4.5 3.5 - 5.2 mmol/L   Chloride 101 96 - 106 mmol/L   CO2 23 20 - 29 mmol/L   Calcium 9.8 8.7 - 10.2 mg/dL   Total Protein 7.3 6.0 - 8.5 g/dL   Albumin 4.5 3.9 - 4.9 g/dL   Globulin, Total 2.8 1.5 - 4.5 g/dL   Bilirubin Total 0.2 0.0 - 1.2 mg/dL   Alkaline Phosphatase 49 44 - 121 IU/L   AST 12 0 - 40 IU/L   ALT 8 0 - 32 IU/L  ANA Titer and Pattern     Status: Abnormal   Collection Time: 07/25/23  2:38 PM  Result Value Ref Range   Speckled Pattern 1:40 (H) <1:40   Note: Comment     Comment: ANA performed by Indirect Fluorescent Antibody (IFA)  Repeat Anti-Scl-70     Status: None   Collection Time: 07/25/23  2:38 PM  Result Value Ref Range   Repeat Anti-Scl-70 Negative Negative    Comment: When Anti-Scl-70 by ELISA is positive (>=20), a second  method is performed to improve predictive value for the diagnosis of systemic sclerosis (Homer, KL et al. [facility] 2018;26(3):115-118).   POCT XPERT XPRESS SARS COVID-2/FLU/RSV [[id number]]     Status: Normal   Collection Time: 07/25/23  4:33 PM  Result Value Ref Range   SARS Coronavirus 2 Negative    FLU A Negative    FLU B Negative    RSV RNA, PCR Negative        Assessment & Plan:   Problem List Items Addressed This Visit       Active Problems   Fibromyalgia   Relevant Orders   ANA 12 Profile, Do All RDL (Completed)   CBC With Diff/Platelet (Completed)   CMP14+EGFR (Completed)   ANA Titer and Pattern (Completed)   Repeat Anti-Scl-70 (Completed)   Other Visit Diagnoses     Hives    -  Primary   Relevant Orders   Ambulatory referral to Allergy   ANA 12 Profile, Do All RDL (Completed)   CBC With Diff/Platelet (Completed)   CMP14+EGFR (Completed)   ANA Titer and Pattern (Completed)    Repeat Anti-Scl-70 (Completed)   Arthralgia, unspecified joint       Relevant Orders   ANA 12 Profile, Do All RDL (Completed)   CBC With Diff/Platelet (Completed)   CMP14+EGFR (Completed)   ANA Titer and Pattern (Completed)   Repeat Anti-Scl-70 (Completed)   Dizziness       Relevant Orders   ANA 12 Profile, Do All RDL (Completed)   CBC With Diff/Platelet (Completed)   CMP14+EGFR (Completed)   POCT XPERT XPRESS SARS COVID-2/FLU/RSV [[id number]] (Completed)   ANA Titer and Pattern (Completed)   Repeat Anti-Scl-70 (Completed)      Checking ANA and other similar labs today.  Will call with results  Also would like to make sure patient gets set up with allergy/immunology given the hives.    Return in about 1 month (around 08/24/2023).   Total time spent: 30 minutes  Miki Kins, FNP  07/25/2023   This document may have been prepared by [facility] Voice Recognition software and as such may include unintentional dictation errors.

## 2023-07-26 ENCOUNTER — Other Ambulatory Visit: Payer: Commercial Managed Care - PPO

## 2023-08-01 ENCOUNTER — Other Ambulatory Visit: Payer: Self-pay

## 2023-08-01 DIAGNOSIS — F900 Attention-deficit hyperactivity disorder, predominantly inattentive type: Secondary | ICD-10-CM

## 2023-08-01 DIAGNOSIS — G479 Sleep disorder, unspecified: Secondary | ICD-10-CM

## 2023-08-02 ENCOUNTER — Ambulatory Visit (INDEPENDENT_AMBULATORY_CARE_PROVIDER_SITE_OTHER): Payer: Commercial Managed Care - PPO

## 2023-08-02 DIAGNOSIS — R2232 Localized swelling, mass and lump, left upper limb: Secondary | ICD-10-CM | POA: Diagnosis not present

## 2023-08-02 MED ORDER — TRAZODONE HCL 300 MG PO TABS
300.0000 mg | ORAL_TABLET | Freq: Every day | ORAL | 0 refills | Status: DC
Start: 2023-08-02 — End: 2023-12-25

## 2023-08-02 MED ORDER — AMPHETAMINE-DEXTROAMPHETAMINE 20 MG PO TABS
20.0000 mg | ORAL_TABLET | Freq: Two times a day (BID) | ORAL | 0 refills | Status: DC
Start: 2023-08-02 — End: 2023-09-01

## 2023-08-03 ENCOUNTER — Other Ambulatory Visit: Payer: Self-pay

## 2023-08-03 MED ORDER — TIZANIDINE HCL 4 MG PO TABS: 4.0000 mg | ORAL_TABLET | Freq: Three times a day (TID) | ORAL | 1 refills | Status: DC

## 2023-08-08 ENCOUNTER — Other Ambulatory Visit: Payer: Self-pay

## 2023-08-08 MED ORDER — HYDROXYZINE HCL 25 MG PO TABS: 25.0000 mg | ORAL_TABLET | Freq: Three times a day (TID) | ORAL | 0 refills | Status: DC | PRN

## 2023-08-08 NOTE — Progress Notes (Deleted)
ANNUAL PREVENTATIVE CARE GYNECOLOGY  ENCOUNTER NOTE  SUBJECTIVE:       Sandra Aguilar is a 47 y.o. [phone] female here for a routine annual gynecologic exam. The patient {is/is not/has never been:13135} sexually active. The patient {is/is not:13135} taking hormone replacement therapy. {post-men bleed:13152::"Patient denies post-menopausal vaginal bleeding."} The patient wears seatbelts: {yes/no:311178}. The patient participates in regular exercise: {yes/no/not asked:9010}. Has the patient ever been transfused or tattooed?: {yes/no/not asked:9010}. The patient reports that there {is/is not:9024} domestic violence in her life.  Current complaints: 1.  ***    Gynecologic History Patient's last menstrual period was 07/02/2023 (approximate). Contraception: {method:5051} Last Pap: 08/16/2016. Results were: normal Last mammogram: ***. Results were: {norm/abn:16337} Last Colonoscopy:  Last Dexa Scan:    Obstetric History OB History  Gravida Para Term Preterm AB Living  4 3 3   1 3   SAB IAB Ectopic Multiple Live Births  1       3    # Outcome Date GA Lbr Len/2nd Weight Sex Type Anes PTL Lv  4 Term 2001     Vag-Spont   LIV  3 SAB 1999          2 Term 1996     Vag-Spont   LIV  1 Term 50     Vag-Spont   LIV    Past Medical History:  Diagnosis Date   Depression    Family history of breast cancer 05/27/2018   Fibromyalgia    Insomnia    Lung nodule    Lung nodule, solitary 09/04/2020   PTSD (post-traumatic stress disorder)    Strep pharyngitis 02/17/2021   Last Assessment & Plan: Formatting of this note might be different from the original. Will not check a rapid strep test for her sore throat due to her husband was positive for strep throat diagnosed yesterday in the same clinic.  Will prescribe Pen-Vee K 500 mg 1 every 12 hours for 10 days.  Be sure to drink plenty of fluids including Gatorade, take Tylenol or Advil for pain or fever as needed.  I   Stroke [facility])    Suspected  COVID-19 virus infection 06/29/2020   Formatting of this note might be different from the original. Sheenah presents via virtual visit with complaints of cough with dark yellow and green mucous, nasal congestion and stuffiness, low grade fevers, sore throat, nausea, vomiting, and recent sick exposure. She had a recent negative covid test last week, and started Augmentin for a URI after her negative Covid results.  She states she start   Thyroid nodule     Family History  Problem Relation Age of Onset   Heart attack Father    Vascular Disease Father    Vascular Disease Maternal Uncle    Obesity Paternal Aunt    Vascular Disease Paternal Aunt    Heart attack Maternal Grandmother    Ovarian cancer Maternal Grandmother    Vascular Disease Maternal Grandfather    Stroke Paternal Grandmother     Past Surgical History:  Procedure Laterality Date   APPENDECTOMY     CHOLECYSTECTOMY      Social History   Socioeconomic History   Marital status: Married    Spouse name: Not on file   Number of children: Not on file   Years of education: Not on file   Highest education level: Not on file  Occupational History   Not on file  Tobacco Use   Smoking status: Never    Passive exposure: Never  Smokeless tobacco: Never  Vaping Use   Vaping status: Never Used  Substance and Sexual Activity   Alcohol use: Not Currently    Comment: ocassionally   Drug use: Never   Sexual activity: Yes    Birth control/protection: None, Other-see comments    Comment: husband has vasectomy  Other Topics Concern   Not on file  Social History Narrative   Not on file   Social Determinants of Health   Financial Resource Strain: Not on file  Food Insecurity: Not on file  Transportation Needs: Not on file  Physical Activity: Not on file  Stress: Not on file  Social Connections: Unknown (05/08/2020)   [facility], [facility]   Social Connections    Frequency of Communication with Friends  and Family: Not asked    Frequency of Social Gatherings with Friends and Family: Not asked  Intimate Partner Violence: Unknown (05/08/2020)   Received from [facility], [facility]   Intimate Partner Violence    Fear of Current or Ex-Partner: Not asked    Emotionally Abused: Not asked    Physically Abused: Not asked    Sexually Abused: Not asked    Current Outpatient Medications on File Prior to Visit  Medication Sig Dispense Refill   albuterol (VENTOLIN HFA) 108 (90 Base) MCG/ACT inhaler INHALE 2 PUFFS INTO THE LUNGS EVERY 6 HOURS AS NEEDED FOR SHORTNESS OF BREATH 18 each 0   amphetamine-dextroamphetamine (ADDERALL) 20 MG tablet Take 1 tablet (20 mg total) by mouth 2 (two) times daily. 60 tablet 0   fluticasone (FLONASE) 50 MCG/ACT nasal spray Place 2 sprays into both nostrils daily. 16 g 0   gabapentin (NEURONTIN) 600 MG tablet Take 1 tablet (600 mg total) by mouth 2 (two) times daily. 180 tablet 0   HYDROcodone-acetaminophen (NORCO) 10-325 MG tablet Take 1 tablet by mouth every 8 (eight) hours as needed for severe pain. 90 tablet 0   hydrOXYzine (ATARAX) 25 MG tablet Take 1 tablet (25 mg total) by mouth 3 (three) times daily as needed. 30 tablet 0   hyoscyamine (LEVSIN SL) 0.125 MG SL tablet Place 1 tablet (0.125 mg total) under the tongue every 4 (four) hours as needed. 30 tablet 0   LORazepam (ATIVAN) 2 MG tablet Take 1 tablet (2 mg total) by mouth 2 (two) times daily as needed for anxiety. 60 tablet 0   omeprazole (PRILOSEC OTC) 20 MG tablet Take 1 tablet (20 mg total) by mouth daily. 30 tablet 2   ondansetron (ZOFRAN-ODT) 8 MG disintegrating tablet Take 1 tablet (8 mg total) by mouth every 8 (eight) hours as needed for nausea or vomiting. 20 tablet 0   predniSONE (STERAPRED UNI-PAK 21 TAB) 10 MG (21) TBPK tablet Take as directed 1 each 0   tiZANidine (ZANAFLEX) 4 MG tablet Take 1 tablet (4 mg total) by mouth 3 (three) times daily. 30 tablet 1   trazodone (DESYREL) 300 MG tablet Take  1 tablet (300 mg total) by mouth at bedtime. 90 tablet 0   No current facility-administered medications on file prior to visit.    Allergies  Allergen Reactions   Ciprofloxacin Nausea And Vomiting and Other (See Comments)    Neuropathy  Neuropathy  Neuropathy  Neuropathy  Neuropathy   Nitrofurantoin Hives    Other reaction(s): Hives/Swelling-Allergy   Promethazine Other (See Comments)    "Childhood," pt stated she has tolerated since   Sulfa Antibiotics Nausea And Vomiting, Other (See Comments) and Rash    CAUSES  PAIN, PT REPORTS      Review of Systems ROS Review of Systems - General ROS: negative for - chills, fatigue, fever, hot flashes, night sweats, weight gain or weight loss Psychological ROS: negative for - anxiety, decreased libido, depression, mood swings, physical abuse or sexual abuse Ophthalmic ROS: negative for - blurry vision, eye pain or loss of vision ENT ROS: negative for - headaches, hearing change, visual changes or vocal changes Allergy and Immunology ROS: negative for - hives, itchy/watery eyes or seasonal allergies Hematological and Lymphatic ROS: negative for - bleeding problems, bruising, swollen lymph nodes or weight loss Endocrine ROS: negative for - galactorrhea, hair pattern changes, hot flashes, malaise/lethargy, mood swings, palpitations, polydipsia/polyuria, skin changes, temperature intolerance or unexpected weight changes Breast ROS: negative for - new or changing breast lumps or nipple discharge Respiratory ROS: negative for - cough or shortness of breath Cardiovascular ROS: negative for - chest pain, irregular heartbeat, palpitations or shortness of breath Gastrointestinal ROS: no abdominal pain, change in bowel habits, or black or bloody stools Genito-Urinary ROS: no dysuria, trouble voiding, or hematuria Musculoskeletal ROS: negative for - joint pain or joint stiffness Neurological ROS: negative for - bowel and bladder control  changes Dermatological ROS: negative for rash and skin lesion changes   OBJECTIVE:   LMP 07/02/2023 (Approximate)  CONSTITUTIONAL: Well-developed, well-nourished female in no acute distress.  PSYCHIATRIC: Normal mood and affect. Normal behavior. Normal judgment and thought content. NEUROLGIC: Alert and oriented to person, place, and time. Normal muscle tone coordination. No cranial nerve deficit noted. HENT:  Normocephalic, atraumatic, External right and left ear normal. Oropharynx is clear and moist EYES: Conjunctivae and EOM are normal. Pupils are equal, round, and reactive to light. No scleral icterus.  NECK: Normal range of motion, supple, no masses.  Normal thyroid.  SKIN: Skin is warm and dry. No rash noted. Not diaphoretic. No erythema. No pallor. CARDIOVASCULAR: Normal heart rate noted, regular rhythm, no murmur. RESPIRATORY: Clear to auscultation bilaterally. Effort and breath sounds normal, no problems with respiration noted. BREASTS: Symmetric in size. No masses, skin changes, nipple drainage, or lymphadenopathy. ABDOMEN: Soft, normal bowel sounds, no distention noted.  No tenderness, rebound or guarding.  BLADDER: Normal PELVIC:  Bladder {:311640}  Urethra: {:311719}  Vulva: {:311722}  Vagina: {:311643}  Cervix: {:311644}  Uterus: {:311718}  Adnexa: {:311645}  RV: {Blank multiple:19196::"External Exam NormaI","No Rectal Masses","Normal Sphincter tone"}  MUSCULOSKELETAL: Normal range of motion. No tenderness.  No cyanosis, clubbing, or edema.  2+ distal pulses. LYMPHATIC: No Axillary, Supraclavicular, or Inguinal Adenopathy.   Labs: Lab Results  Component Value Date   WBC 8.9 07/23/2023   HGB 13.1 07/23/2023   HCT 38.5 07/23/2023   MCV 92.3 07/23/2023   PLT 260 07/23/2023    Lab Results  Component Value Date   CREATININE 0.70 07/23/2023   BUN 12 07/23/2023   NA 135 07/23/2023   K 4.3 07/23/2023   CL 103 07/23/2023   CO2 25 07/23/2023    Lab Results   Component Value Date   ALT 12 02/13/2023   AST 19 02/13/2023   ALKPHOS 44 02/13/2023   BILITOT 0.6 02/13/2023    No results found for: "CHOL", "HDL", "LDLCALC", "LDLDIRECT", "TRIG", "CHOLHDL"  Lab Results  Component Value Date   TSH 1.35 06/02/2023    Lab Results  Component Value Date   HGBA1C 5.5 06/02/2023     ASSESSMENT:   No diagnosis found.   PLAN:   Emyah Roznowski is a 47 y.o. [phone] female  here today for her annual exam, doing well.  -Screenings:  Pap: done with cotesting today  *** w/rflx today Mammogram: ordered***due *** Colon: ordered colonoscopy***Cologuard -OR- due *** Labs: per PCP -Contraception: *** -Vaccines: ***UTD, Tdap today; pt declines Influenza. Gardasil: series not started, declined today.  -Healthy lifestyle modifications discussed: multivitamin, diet, exercise, sunscreen, tobacco and alcohol use. Emphasized importance of regular physical activity.  -Calcium and Vit D recommendation reviewed.  -All questions answered to patient's satisfaction.  -RTC 1 yr for annual, sooner prn.   Julieanne Manson, DO [facility]

## 2023-08-10 ENCOUNTER — Ambulatory Visit: Payer: Commercial Managed Care - PPO | Admitting: Obstetrics

## 2023-08-12 LAB — CBC WITH DIFF/PLATELET
Basophils Absolute: 0.1 10*3/uL (ref 0.0–0.2)
Basos: 1 %
EOS (ABSOLUTE): 0.2 10*3/uL (ref 0.0–0.4)
Eos: 2 %
Hematocrit: 39.6 % (ref 34.0–46.6)
Hemoglobin: 13.5 g/dL (ref 11.1–15.9)
Immature Grans (Abs): 0 10*3/uL (ref 0.0–0.1)
Immature Granulocytes: 0 %
Lymphocytes Absolute: 2.6 10*3/uL (ref 0.7–3.1)
Lymphs: 34 %
MCH: 32.1 pg (ref 26.6–33.0)
MCHC: 34.1 g/dL (ref 31.5–35.7)
MCV: 94 fL (ref 79–97)
Monocytes Absolute: 0.6 10*3/uL (ref 0.1–0.9)
Monocytes: 7 %
Neutrophils Absolute: 4.4 10*3/uL (ref 1.4–7.0)
Neutrophils: 56 %
Platelets: 295 10*3/uL (ref 150–450)
RBC: 4.2 x10E6/uL (ref 3.77–5.28)
RDW: 12.1 % (ref 11.7–15.4)
WBC: 7.8 10*3/uL (ref 3.4–10.8)

## 2023-08-12 LAB — CMP14+EGFR
ALT: 8 [IU]/L (ref 0–32)
AST: 12 [IU]/L (ref 0–40)
Albumin: 4.5 g/dL (ref 3.9–4.9)
Alkaline Phosphatase: 49 [IU]/L (ref 44–121)
BUN/Creatinine Ratio: 13 (ref 9–23)
BUN: 10 mg/dL (ref 6–24)
Bilirubin Total: 0.2 mg/dL (ref 0.0–1.2)
CO2: 23 mmol/L (ref 20–29)
Calcium: 9.8 mg/dL (ref 8.7–10.2)
Chloride: 101 mmol/L (ref 96–106)
Creatinine, Ser: 0.76 mg/dL (ref 0.57–1.00)
Globulin, Total: 2.8 g/dL (ref 1.5–4.5)
Glucose: 122 mg/dL — ABNORMAL HIGH (ref 70–99)
Potassium: 4.5 mmol/L (ref 3.5–5.2)
Sodium: 138 mmol/L (ref 134–144)
Total Protein: 7.3 g/dL (ref 6.0–8.5)
eGFR: 97 mL/min/{1.73_m2} (ref >59)

## 2023-08-12 LAB — ANA 12 PROFILE, DO ALL RDL
Anti-Cardiolipin Ab, IgA (RDL): <12 [APL'U]/mL (ref <12)
Anti-Cardiolipin Ab, IgG (RDL): <15 [GPL'U]/mL (ref <15)
Anti-Cardiolipin Ab, IgM (RDL): <13 [MPL'U]/mL (ref <13)
Anti-Centromere Ab (RDL): <1:40 {titer}
Anti-La (SS-B) Ab (RDL): <20 U (ref <20)
Anti-Nuclear Ab by IFA (RDL): POSITIVE — AB
Anti-Ro (SS-A) Ab (RDL): <20 U (ref <20)
Anti-Scl-70 Ab (RDL): 34 U — ABNORMAL HIGH (ref <20)
Anti-Sm Ab (RDL): <20 U (ref <20)
Anti-TPO Ab (RDL): <9 [IU]/mL (ref <9.0)
Anti-U1 RNP Ab (RDL): <20 U (ref <20)
Anti-dsDNA Ab by Farr(RDL): <8 [IU]/mL (ref <8.0)
C3 Complement (RDL): 166 mg/dL (ref 82–167)
C4 Complement (RDL): 40 mg/dL (ref 14–44)

## 2023-08-12 LAB — ANA TITER AND PATTERN: Speckled Pattern: 1:40 {titer} — ABNORMAL HIGH

## 2023-08-12 LAB — REPEAT ANTI-SCL-70: Repeat Anti-Scl-70: NEGATIVE

## 2023-08-15 ENCOUNTER — Other Ambulatory Visit: Payer: Self-pay | Admitting: Family

## 2023-08-15 DIAGNOSIS — F411 Generalized anxiety disorder: Secondary | ICD-10-CM

## 2023-08-17 ENCOUNTER — Other Ambulatory Visit: Payer: Self-pay

## 2023-08-17 DIAGNOSIS — F411 Generalized anxiety disorder: Secondary | ICD-10-CM

## 2023-08-18 MED ORDER — LORAZEPAM 2 MG PO TABS
2.0000 mg | ORAL_TABLET | Freq: Two times a day (BID) | ORAL | 0 refills | Status: DC | PRN
Start: 2023-08-18 — End: 2023-09-18

## 2023-08-18 MED ORDER — TIZANIDINE HCL 4 MG PO TABS: 4.0000 mg | ORAL_TABLET | Freq: Three times a day (TID) | ORAL | 1 refills | Status: DC

## 2023-08-20 ENCOUNTER — Encounter: Payer: Self-pay | Admitting: Family

## 2023-08-23 ENCOUNTER — Ambulatory Visit: Payer: Commercial Managed Care - PPO | Admitting: Family

## 2023-08-24 ENCOUNTER — Ambulatory Visit: Payer: Commercial Managed Care - PPO | Admitting: Family

## 2023-08-29 ENCOUNTER — Ambulatory Visit: Payer: Commercial Managed Care - PPO | Admitting: Family

## 2023-08-31 ENCOUNTER — Other Ambulatory Visit: Payer: Self-pay

## 2023-08-31 ENCOUNTER — Encounter: Payer: Self-pay | Admitting: Family

## 2023-08-31 ENCOUNTER — Ambulatory Visit: Payer: Commercial Managed Care - PPO | Admitting: Family

## 2023-08-31 VITALS — BP 108/78 | HR 82 | Ht 65.0 in | Wt 134.4 lb

## 2023-08-31 DIAGNOSIS — Z0130 Encounter for examination of blood pressure without abnormal findings: Secondary | ICD-10-CM

## 2023-08-31 DIAGNOSIS — L509 Urticaria, unspecified: Secondary | ICD-10-CM

## 2023-08-31 DIAGNOSIS — R8290 Unspecified abnormal findings in urine: Secondary | ICD-10-CM

## 2023-08-31 LAB — POCT URINALYSIS DIPSTICK
Bilirubin, UA: NEGATIVE
Glucose, UA: NEGATIVE
Ketones, UA: NEGATIVE
Leukocytes, UA: NEGATIVE
Nitrite, UA: NEGATIVE
Protein, UA: NEGATIVE
Spec Grav, UA: 1.015 (ref 1.010–1.025)
Urobilinogen, UA: 0.2 U/dL
pH, UA: 6 (ref 5.0–8.0)

## 2023-08-31 MED ORDER — TIZANIDINE HCL 4 MG PO TABS: 4.0000 mg | ORAL_TABLET | Freq: Three times a day (TID) | ORAL | 3 refills | Status: DC

## 2023-09-01 ENCOUNTER — Other Ambulatory Visit: Payer: Self-pay

## 2023-09-01 DIAGNOSIS — M797 Fibromyalgia: Secondary | ICD-10-CM

## 2023-09-01 DIAGNOSIS — J4520 Mild intermittent asthma, uncomplicated: Secondary | ICD-10-CM

## 2023-09-01 DIAGNOSIS — G8929 Other chronic pain: Secondary | ICD-10-CM

## 2023-09-01 DIAGNOSIS — F900 Attention-deficit hyperactivity disorder, predominantly inattentive type: Secondary | ICD-10-CM

## 2023-09-01 MED ORDER — GABAPENTIN 600 MG PO TABS
600.0000 mg | ORAL_TABLET | Freq: Two times a day (BID) | ORAL | 0 refills | Status: DC
Start: 2023-09-01 — End: 2024-02-05

## 2023-09-01 MED ORDER — AMPHETAMINE-DEXTROAMPHETAMINE 20 MG PO TABS
20.0000 mg | ORAL_TABLET | Freq: Two times a day (BID) | ORAL | 0 refills | Status: DC
Start: 2023-09-01 — End: 2023-10-02

## 2023-09-01 MED ORDER — HYDROCODONE-ACETAMINOPHEN 10-325 MG PO TABS: 1.0000 | ORAL_TABLET | Freq: Three times a day (TID) | ORAL | 0 refills | Status: DC | PRN

## 2023-09-01 MED ORDER — ALBUTEROL SULFATE HFA 108 (90 BASE) MCG/ACT IN AERS: 1.0000 | INHALATION_SPRAY | Freq: Four times a day (QID) | RESPIRATORY_TRACT | 3 refills | Status: AC | PRN

## 2023-09-02 LAB — URINE CULTURE

## 2023-09-03 LAB — CMP14+EGFR
ALT: 8 [IU]/L (ref 0–32)
AST: 15 [IU]/L (ref 0–40)
Albumin: 4.4 g/dL (ref 3.9–4.9)
Alkaline Phosphatase: 50 [IU]/L (ref 44–121)
BUN/Creatinine Ratio: 10 (ref 9–23)
BUN: 7 mg/dL (ref 6–24)
Bilirubin Total: <0.2 mg/dL (ref 0.0–1.2)
CO2: 25 mmol/L (ref 20–29)
Calcium: 10.1 mg/dL (ref 8.7–10.2)
Chloride: 100 mmol/L (ref 96–106)
Creatinine, Ser: 0.73 mg/dL (ref 0.57–1.00)
Globulin, Total: 2.8 g/dL (ref 1.5–4.5)
Glucose: 86 mg/dL (ref 70–99)
Potassium: 4.4 mmol/L (ref 3.5–5.2)
Sodium: 139 mmol/L (ref 134–144)
Total Protein: 7.2 g/dL (ref 6.0–8.5)
eGFR: 102 mL/min/{1.73_m2} (ref >59)

## 2023-09-03 LAB — CBC WITH DIFFERENTIAL/PLATELET
Basophils Absolute: 0.1 10*3/uL (ref 0.0–0.2)
Basos: 1 %
EOS (ABSOLUTE): 0.1 10*3/uL (ref 0.0–0.4)
Eos: 2 %
Hematocrit: 39.2 % (ref 34.0–46.6)
Hemoglobin: 12.6 g/dL (ref 11.1–15.9)
Immature Grans (Abs): 0 10*3/uL (ref 0.0–0.1)
Immature Granulocytes: 0 %
Lymphocytes Absolute: 2.1 10*3/uL (ref 0.7–3.1)
Lymphs: 32 %
MCH: 30.6 pg (ref 26.6–33.0)
MCHC: 32.1 g/dL (ref 31.5–35.7)
MCV: 95 fL (ref 79–97)
Monocytes Absolute: 0.5 10*3/uL (ref 0.1–0.9)
Monocytes: 7 %
Neutrophils Absolute: 3.7 10*3/uL (ref 1.4–7.0)
Neutrophils: 58 %
Platelets: 234 10*3/uL (ref 150–450)
RBC: 4.12 x10E6/uL (ref 3.77–5.28)
RDW: 12.1 % (ref 11.7–15.4)
WBC: 6.5 10*3/uL (ref 3.4–10.8)

## 2023-09-03 LAB — IGG, IGA, IGM
IgA/Immunoglobulin A, Serum: 371 mg/dL — ABNORMAL HIGH (ref 87–352)
IgG (Immunoglobin G), Serum: 1206 mg/dL (ref 586–1602)
IgM (Immunoglobulin M), Srm: 75 mg/dL (ref 26–217)

## 2023-09-03 LAB — TRYPTASE: Tryptase: 5.1 ug/L (ref 2.2–13.2)

## 2023-09-13 ENCOUNTER — Encounter: Payer: Self-pay | Admitting: Family

## 2023-09-13 DIAGNOSIS — F411 Generalized anxiety disorder: Secondary | ICD-10-CM

## 2023-09-18 MED ORDER — LORAZEPAM 2 MG PO TABS
2.0000 mg | ORAL_TABLET | Freq: Two times a day (BID) | ORAL | 3 refills | Status: DC | PRN
Start: 2023-09-18 — End: 2023-12-04

## 2023-09-28 ENCOUNTER — Telehealth: Payer: Commercial Managed Care - PPO | Admitting: Family Medicine

## 2023-09-28 DIAGNOSIS — J019 Acute sinusitis, unspecified: Secondary | ICD-10-CM | POA: Diagnosis not present

## 2023-09-28 DIAGNOSIS — B9689 Other specified bacterial agents as the cause of diseases classified elsewhere: Secondary | ICD-10-CM

## 2023-09-28 DIAGNOSIS — R6889 Other general symptoms and signs: Secondary | ICD-10-CM

## 2023-09-28 MED ORDER — PSEUDOEPH-BROMPHEN-DM 30-2-10 MG/5ML PO SYRP
5.0000 mL | ORAL_SOLUTION | Freq: Four times a day (QID) | ORAL | 0 refills | Status: DC | PRN
Start: 2023-09-28 — End: 2024-01-27

## 2023-09-28 MED ORDER — AMOXICILLIN-POT CLAVULANATE 875-125 MG PO TABS
1.0000 | ORAL_TABLET | Freq: Two times a day (BID) | ORAL | 0 refills | Status: AC
Start: 2023-09-28 — End: 2023-10-05

## 2023-09-28 NOTE — Progress Notes (Signed)
Virtual Visit Consent   [name], you are scheduled for a virtual visit with a [facility] provider today. Just as with appointments in the office, your consent must be obtained to participate. Your consent will be active for this visit and any virtual visit you may have with one of our providers in the next 365 days. If you have a MyChart account, a copy of this consent can be sent to you electronically.  As this is a virtual visit, video technology does not allow for your provider to perform a traditional examination. This may limit your provider's ability to fully assess your condition. If your provider identifies any concerns that need to be evaluated in person or the need to arrange testing (such as labs, EKG, etc.), we will make arrangements to do so. Although advances in technology are sophisticated, we cannot ensure that it will always work on either your end or our end. If the connection with a video visit is poor, the visit may have to be switched to a telephone visit. With either a video or telephone visit, we are not always able to ensure that we have a secure connection.  By engaging in this virtual visit, you consent to the provision of healthcare and authorize for your insurance to be billed (if applicable) for the services provided during this visit. Depending on your insurance coverage, you may receive a charge related to this service.  I need to obtain your verbal consent now. Are you willing to proceed with your visit today? [name] has provided verbal consent on 09/28/2023 for a virtual visit (video or telephone). Freddy Finner, NP  Date: 09/28/2023 8:57 AM  Virtual Visit via Video Note   I, Freddy Finner, connected with  Sandra Aguilar  ([id number], February 27, 1976) on 09/28/23 at  9:00 AM EST by a video-enabled telemedicine application and verified that I am speaking with the correct person using two identifiers.  Location: Patient: Virtual Visit Location Patient:  Home Provider: Virtual Visit Location Provider: Home Office   I discussed the limitations of evaluation and management by telemedicine and the availability of in person appointments. The patient expressed understanding and agreed to proceed.    History of Present Illness: Sandra Aguilar is a 47 y.o. who identifies as a female who was assigned female at birth, and is being seen today for flu like symptoms   A few weeks ago- had a URI/chest cold and felt like nothing was helping.  Was feeling ok- but then started to get worse. Then about 3 days ago started having mucus production that is light green.  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   Chronic pain of right knee 06/14/2023   Cerebellar lesion 06/02/2023   High risk medication use 06/02/2023   Polypharmacy 06/02/2023   Thyroid nodule 06/02/2023   Mild intermittent asthma without complication 06/02/2023   Sleep disorder 06/02/2023   Chronic pain syndrome 06/02/2023   Hemangioma of other sites 06/02/2023   Adnexal mass 06/02/2023   Constipation due to opioid therapy 06/02/2023   Attention deficit hyperactivity disorder (ADHD), inattentive type, 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/2023   Seasonal allergic rhinitis due to pollen  02/07/2023   Fibromyalgia 05/11/2021   Migraine with aura 05/11/2021   Varicose veins of right lower extremity 05/11/2021   Obstructive sleep apnea 05/11/2021   Moderate episode of recurrent major depressive disorder (HCC) 11/12/2020   Tremors of nervous system 11/12/2020   Chronic post-traumatic stress disorder (PTSD) 11/12/2020   Orthostatic hypotension 09/15/2020   Palpitations 09/15/2020    Generalized anxiety disorder 09/03/2020   GERD (gastroesophageal reflux disease) 09/03/2020   Muscle spasm 06/29/2020   Insomnia 06/29/2020   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   Fibrocystic disease of breast 05/27/2018    Allergies:  Allergies  Allergen Reactions   Ciprofloxacin Nausea And Vomiting and Other (See Comments)    Neuropathy  Neuropathy  Neuropathy  Neuropathy  Neuropathy   Nitrofurantoin Hives    Other reaction(s): Hives/Swelling-Allergy   Promethazine Other (See Comments)    "Childhood," pt stated she has tolerated since   Sulfa Antibiotics Nausea And Vomiting, Other (See Comments) and Rash    CAUSES PAIN, PT REPORTS   Medications:  Current Outpatient Medications:    albuterol (VENTOLIN HFA) 108 (90 Base) MCG/ACT inhaler, Inhale 1-2 puffs into the lungs every 6 (six) hours as needed for wheezing or shortness of breath., Disp: 18 each, Rfl: 3   amphetamine-dextroamphetamine (ADDERALL) 20 MG tablet, Take 1 tablet (20 mg total) by mouth 2 (two) times daily., Disp: 60 tablet, Rfl: 0   fluticasone (FLONASE) 50 MCG/ACT nasal spray, Place 2 sprays into both nostrils daily., Disp: 16 g, Rfl: 0   gabapentin (NEURONTIN) 600 MG tablet, Take 1 tablet (600 mg total) by mouth 2 (two) times daily., Disp: 180 tablet, Rfl: 0   HYDROcodone-acetaminophen (NORCO) 10-325 MG tablet, Take 1 tablet by mouth every 8 (eight) hours as needed for severe pain (pain score 7-10)., Disp: 90 tablet, Rfl: 0   hydrOXYzine (ATARAX) 25 MG tablet, Take 1 tablet (25 mg total) by mouth 3 (three) times daily as needed., Disp: 30 tablet, Rfl: 0   hyoscyamine (LEVSIN SL) 0.125 MG SL tablet, Place 1 tablet (0.125 mg total) under the tongue every 4 (four) hours as needed., Disp: 30 tablet, Rfl: 0   LORazepam (ATIVAN) 2 MG tablet, Take 1 tablet (2 mg total) by mouth 2 (two) times daily as needed for anxiety., Disp: 60 tablet, Rfl: 3   omeprazole (PRILOSEC OTC) 20 MG tablet, Take 1 tablet (20 mg  total) by mouth daily., Disp: 30 tablet, Rfl: 2   ondansetron (ZOFRAN-ODT) 8 MG disintegrating tablet, Take 1 tablet (8 mg total) by mouth every 8 (eight) hours as needed for nausea or vomiting., Disp: 20 tablet, Rfl: 0   tiZANidine (ZANAFLEX) 4 MG tablet, Take 1 tablet (4 mg total) by mouth 3 (three) times daily., Disp: 90 tablet, Rfl: 3   trazodone (DESYREL) 300 MG tablet, Take 1 tablet (300 mg total) by mouth at bedtime., Disp: 90 tablet, Rfl: 0  Observations/Objective: Patient is well-developed, well-nourished in no acute distress.  Resting comfortably  at home.  Head is normocephalic, atraumatic.  No labored breathing.  Speech is clear and coherent with logical content.  Patient is alert and oriented at baseline.    Assessment and Plan: 1. Acute bacterial sinusitis  - amoxicillin-clavulanate (AUGMENTIN) 875-125 MG tablet; Take 1 tablet by mouth 2 (two) times daily for 7 days.  Dispense: 14 tablet; Refill: 0  -Given duration of several weeks with sudden worsening will treat for infection- bacterial related  -Take meds as prescribed -Rest -Use a cool mist humidifier  especially during the winter months when heat dries out the air. - Use saline nose sprays frequently to help soothe nasal passages and promote drainage. -Saline irrigations of the nose can be very helpful if done frequently.             * 4X daily for 1 week*             * Use of a nettie pot can be helpful with this.  *Follow directions with this* *Boiled or distilled water only -stay hydrated by drinking plenty of fluids - Keep thermostat turn down low to prevent drying out sinuses - For fever or aches or pains- take tylenol or ibuprofen as directed on bottle             * for fevers greater than 101 orally you may alternate ibuprofen and tylenol every 3 hours.  If you do not improve you will need a follow up visit in person.                 Reviewed side effects, risks and benefits of medication.    Patient  acknowledged agreement and understanding of the plan.   Past Medical, Surgical, Social History, Allergies, and Medications have been Reviewed.     Follow Up Instructions: I discussed the assessment and treatment plan with the patient. The patient was provided an opportunity to ask questions and all were answered. The patient agreed with the plan and demonstrated an understanding of the instructions.  A copy of instructions were sent to the patient via MyChart unless otherwise noted below.     The patient was advised to call back or seek an in-person evaluation if the symptoms worsen or if the condition fails to improve as anticipated.    Freddy Finner, NP

## 2023-09-28 NOTE — Patient Instructions (Addendum)
Sandra Aguilar, thank you for joining Freddy Finner, NP for today's virtual visit.  While this provider is not your primary care provider (PCP), if your PCP is located in our provider database this encounter information will be shared with them immediately following your visit.   A [facility] MyChart account gives you access to today's visit and all your visits, tests, and labs performed at [facility] " click here if you don't have a [facility] MyChart account or go to [email or web address]  Consent: (Patient) Sandra Aguilar provided verbal consent for this virtual visit at the beginning of the encounter.  Current Medications:  Current Outpatient Medications:    amoxicillin-clavulanate (AUGMENTIN) 875-125 MG tablet, Take 1 tablet by mouth 2 (two) times daily for 7 days., Disp: 14 tablet, Rfl: 0   brompheniramine-pseudoephedrine-DM 30-2-10 MG/5ML syrup, Take 5 mLs by mouth 4 (four) times daily as needed., Disp: 120 mL, Rfl: 0   albuterol (VENTOLIN HFA) 108 (90 Base) MCG/ACT inhaler, Inhale 1-2 puffs into the lungs every 6 (six) hours as needed for wheezing or shortness of breath., Disp: 18 each, Rfl: 3   amphetamine-dextroamphetamine (ADDERALL) 20 MG tablet, Take 1 tablet (20 mg total) by mouth 2 (two) times daily., Disp: 60 tablet, Rfl: 0   fluticasone (FLONASE) 50 MCG/ACT nasal spray, Place 2 sprays into both nostrils daily., Disp: 16 g, Rfl: 0   gabapentin (NEURONTIN) 600 MG tablet, Take 1 tablet (600 mg total) by mouth 2 (two) times daily., Disp: 180 tablet, Rfl: 0   HYDROcodone-acetaminophen (NORCO) 10-325 MG tablet, Take 1 tablet by mouth every 8 (eight) hours as needed for severe pain (pain score 7-10)., Disp: 90 tablet, Rfl: 0   hydrOXYzine (ATARAX) 25 MG tablet, Take 1 tablet (25 mg total) by mouth 3 (three) times daily as needed., Disp: 30 tablet, Rfl: 0   hyoscyamine (LEVSIN SL) 0.125 MG SL tablet, Place 1 tablet (0.125 mg total) under the tongue every 4 (four) hours  as needed., Disp: 30 tablet, Rfl: 0   LORazepam (ATIVAN) 2 MG tablet, Take 1 tablet (2 mg total) by mouth 2 (two) times daily as needed for anxiety., Disp: 60 tablet, Rfl: 3   omeprazole (PRILOSEC OTC) 20 MG tablet, Take 1 tablet (20 mg total) by mouth daily., Disp: 30 tablet, Rfl: 2   ondansetron (ZOFRAN-ODT) 8 MG disintegrating tablet, Take 1 tablet (8 mg total) by mouth every 8 (eight) hours as needed for nausea or vomiting., Disp: 20 tablet, Rfl: 0   tiZANidine (ZANAFLEX) 4 MG tablet, Take 1 tablet (4 mg total) by mouth 3 (three) times daily., Disp: 90 tablet, Rfl: 3   trazodone (DESYREL) 300 MG tablet, Take 1 tablet (300 mg total) by mouth at bedtime., Disp: 90 tablet, Rfl: 0   Medications ordered in this encounter:  Meds ordered this encounter  Medications   amoxicillin-clavulanate (AUGMENTIN) 875-125 MG tablet    Sig: Take 1 tablet by mouth 2 (two) times daily for 7 days.    Dispense:  14 tablet    Refill:  0    Order Specific Question:   Supervising Provider    Answer:   Merrilee Jansky [id number]   brompheniramine-pseudoephedrine-DM 30-2-10 MG/5ML syrup    Sig: Take 5 mLs by mouth 4 (four) times daily as needed.    Dispense:  120 mL    Refill:  0    Order Specific Question:   Supervising Provider    Answer:   Merrilee Jansky [id number]     *If  you need refills on other medications prior to your next appointment, please contact your pharmacy*  Follow-Up: Call back or seek an in-person evaluation if the symptoms worsen or if the condition fails to improve as anticipated.  [facility] [phone]  Other Instructions  -Take meds as prescribed -Rest -Use a cool mist humidifier especially during the winter months when heat dries out the air. - Use saline nose sprays frequently to help soothe nasal passages and promote drainage. -Saline irrigations of the nose can be very helpful if done frequently.             * 4X daily for 1 week*             * Use of a  nettie pot can be helpful with this.  *Follow directions with this* *Boiled or distilled water only -stay hydrated by drinking plenty of fluids - Keep thermostat turn down low to prevent drying out sinuses - For fever or aches or pains- take tylenol or ibuprofen as directed on bottle             * for fevers greater than 101 orally you may alternate ibuprofen and tylenol every 3 hours.  If you do not improve you will need a follow up visit in person.   If you have been instructed to have an in-person evaluation today at a local Urgent Care facility, please use the link below. It will take you to a list of all of our available [facility], including address, phone number and hours of operation. Please do not delay care.  [facility]  If you or a family member do not have a primary care provider, use the link below to schedule a visit and establish care. When you choose a [facility] primary care physician or advanced practice provider, you gain a long-term partner in health. Find a Primary Care Provider  Learn more about [facility] in-office and virtual care options: [facility] - Get Care Now

## 2023-09-28 NOTE — Progress Notes (Signed)
Patient is requiring a medical note for work given her symptoms.  Since her symptoms seem severe, I am going to request that the patient sign up for a virtual visit.   I have spent 5 minutes in review of e-visit questionnaire, review and updating patient chart, medical decision making and response to patient.   Reed Pandy, PA-C

## 2023-10-02 ENCOUNTER — Ambulatory Visit: Payer: Commercial Managed Care - PPO | Admitting: Family

## 2023-10-02 ENCOUNTER — Telehealth: Payer: Self-pay

## 2023-10-02 ENCOUNTER — Other Ambulatory Visit: Payer: Self-pay | Admitting: Family

## 2023-10-02 ENCOUNTER — Encounter: Payer: Self-pay | Admitting: Family

## 2023-10-02 VITALS — BP 120/80 | HR 92 | Ht 65.0 in | Wt 139.5 lb

## 2023-10-02 DIAGNOSIS — J069 Acute upper respiratory infection, unspecified: Secondary | ICD-10-CM | POA: Diagnosis not present

## 2023-10-02 DIAGNOSIS — G8929 Other chronic pain: Secondary | ICD-10-CM

## 2023-10-02 DIAGNOSIS — F900 Attention-deficit hyperactivity disorder, predominantly inattentive type: Secondary | ICD-10-CM

## 2023-10-02 DIAGNOSIS — E538 Deficiency of other specified B group vitamins: Secondary | ICD-10-CM

## 2023-10-02 DIAGNOSIS — F331 Major depressive disorder, recurrent, moderate: Secondary | ICD-10-CM | POA: Diagnosis not present

## 2023-10-02 DIAGNOSIS — M797 Fibromyalgia: Secondary | ICD-10-CM

## 2023-10-02 MED ORDER — LIDOCAINE VISCOUS HCL 2 % MT SOLN: 15.0000 mL | Freq: Four times a day (QID) | OROMUCOSAL | 0 refills | Status: AC | PRN

## 2023-10-02 NOTE — Assessment & Plan Note (Signed)
B12 injection given in office today.  Return for next injection per provider instructions.

## 2023-10-02 NOTE — Telephone Encounter (Signed)
Gave her negative covid results.

## 2023-10-03 MED ORDER — HYDROCODONE-ACETAMINOPHEN 10-325 MG PO TABS: 1.0000 | ORAL_TABLET | Freq: Three times a day (TID) | ORAL | 0 refills | Status: DC | PRN

## 2023-10-03 MED ORDER — HYDROXYZINE HCL 25 MG PO TABS: 25.0000 mg | ORAL_TABLET | Freq: Three times a day (TID) | ORAL | 0 refills | Status: DC | PRN

## 2023-10-03 MED ORDER — AMPHETAMINE-DEXTROAMPHETAMINE 20 MG PO TABS: 20.0000 mg | ORAL_TABLET | Freq: Two times a day (BID) | ORAL | 0 refills | Status: DC

## 2023-10-04 ENCOUNTER — Ambulatory Visit: Payer: Commercial Managed Care - PPO | Admitting: Family

## 2023-11-03 ENCOUNTER — Encounter: Payer: Self-pay | Admitting: Family

## 2023-11-03 ENCOUNTER — Other Ambulatory Visit: Payer: Self-pay | Admitting: Family

## 2023-11-03 DIAGNOSIS — R001 Bradycardia, unspecified: Secondary | ICD-10-CM

## 2023-11-03 DIAGNOSIS — R002 Palpitations: Secondary | ICD-10-CM

## 2023-11-03 DIAGNOSIS — G8929 Other chronic pain: Secondary | ICD-10-CM

## 2023-11-03 DIAGNOSIS — F900 Attention-deficit hyperactivity disorder, predominantly inattentive type: Secondary | ICD-10-CM

## 2023-11-03 DIAGNOSIS — M797 Fibromyalgia: Secondary | ICD-10-CM

## 2023-11-03 DIAGNOSIS — I959 Hypotension, unspecified: Secondary | ICD-10-CM

## 2023-11-06 MED ORDER — AMPHETAMINE-DEXTROAMPHETAMINE 20 MG PO TABS: 20.0000 mg | ORAL_TABLET | Freq: Two times a day (BID) | ORAL | 0 refills | Status: DC

## 2023-11-06 MED ORDER — HYDROCODONE-ACETAMINOPHEN 10-325 MG PO TABS: 1.0000 | ORAL_TABLET | Freq: Three times a day (TID) | ORAL | 0 refills | Status: DC | PRN

## 2023-11-08 ENCOUNTER — Ambulatory Visit: Payer: Commercial Managed Care - PPO | Admitting: Family

## 2023-11-22 ENCOUNTER — Emergency Department
Admission: EM | Admit: 2023-11-22 | Discharge: 2023-11-22 | Discharge status: Against Medical Advice | Payer: Commercial Managed Care - PPO | Attending: Emergency Medicine | Admitting: Emergency Medicine

## 2023-11-22 ENCOUNTER — Other Ambulatory Visit: Payer: Self-pay

## 2023-11-22 ENCOUNTER — Emergency Department: Payer: Commercial Managed Care - PPO

## 2023-11-22 ENCOUNTER — Encounter: Payer: Self-pay | Admitting: Emergency Medicine

## 2023-11-22 DIAGNOSIS — R079 Chest pain, unspecified: Secondary | ICD-10-CM | POA: Insufficient documentation

## 2023-11-22 DIAGNOSIS — R202 Paresthesia of skin: Secondary | ICD-10-CM | POA: Insufficient documentation

## 2023-11-22 DIAGNOSIS — Z5321 Procedure and treatment not carried out due to patient leaving prior to being seen by health care provider: Secondary | ICD-10-CM | POA: Diagnosis not present

## 2023-11-22 DIAGNOSIS — R519 Headache, unspecified: Secondary | ICD-10-CM | POA: Diagnosis not present

## 2023-11-22 LAB — CBC
HCT: 37 % (ref 36.0–46.0)
Hemoglobin: 12.5 g/dL (ref 12.0–15.0)
MCH: 31.3 pg (ref 26.0–34.0)
MCHC: 33.8 g/dL (ref 30.0–36.0)
MCV: 92.5 fL (ref 80.0–100.0)
Platelets: 236 10*3/uL (ref 150–400)
RBC: 4 MIL/uL (ref 3.87–5.11)
RDW: 12.1 % (ref 11.5–15.5)
WBC: 8.7 10*3/uL (ref 4.0–10.5)
nRBC: 0 % (ref 0.0–0.2)

## 2023-11-22 LAB — BASIC METABOLIC PANEL
Anion gap: 12 (ref 5–15)
BUN: 10 mg/dL (ref 6–20)
CO2: 24 mmol/L (ref 22–32)
Calcium: 9.1 mg/dL (ref 8.9–10.3)
Chloride: 100 mmol/L (ref 98–111)
Creatinine, Ser: 0.7 mg/dL (ref 0.44–1.00)
GFR, Estimated: >60 mL/min (ref >60)
Glucose, Bld: 212 mg/dL — ABNORMAL HIGH (ref 70–99)
Potassium: 4.5 mmol/L (ref 3.5–5.1)
Sodium: 136 mmol/L (ref 135–145)

## 2023-11-22 LAB — TROPONIN I (HIGH SENSITIVITY): Troponin I (High Sensitivity): 3 ng/L (ref <18)

## 2023-11-22 LAB — POC URINE PREG, ED: Preg Test, Ur: NEGATIVE

## 2023-11-22 NOTE — ED Triage Notes (Signed)
Patient to ED via POV for right sided CP that radiates into left arm. Ongoing x2 days. Pt also reporting bilateral leg numbness that started 1 hr ago. Pt ambulatory to triage. States she has had a cold since November- c/o of pounding headache.

## 2023-11-22 NOTE — ED Provider Triage Note (Signed)
Emergency Medicine Provider Triage Evaluation Note  Sandra Aguilar , a 48 y.o. female  was evaluated in triage.  Pt complains of right side chest pain with radiation into left arm x 2 days. Also complaining of a throbbing headache and bilateral leg tingling. She reports having cold symptoms since November that haven't gotten any better. Patient crying in triage.   Physical Exam  BP 129/71 (BP Location: Left Arm)   Pulse 86   Temp 98.1 F (36.7 C) (Oral)   Resp 18   Ht 5\' 5"  (1.651 m)   Wt 59 kg   SpO2 100%   BMI 21.63 kg/m  Gen:   Awake, no distress   Resp:  Normal effort  MSK:   Moves extremities without difficulty  Other:    Medical Decision Making  Medically screening exam initiated at 5:54 PM.  Appropriate orders placed.  Sandra Aguilar was informed that the remainder of the evaluation will be completed by another provider, this initial triage assessment does not replace that evaluation, and the importance of remaining in the ED until their evaluation is complete.  Cardiac workup started.   Sandra Pester, FNP 11/22/23 1910

## 2023-11-23 ENCOUNTER — Ambulatory Visit: Payer: Commercial Managed Care - PPO | Admitting: Family

## 2023-11-28 ENCOUNTER — Ambulatory Visit: Payer: Commercial Managed Care - PPO | Admitting: Dermatology

## 2023-11-28 ENCOUNTER — Ambulatory Visit: Payer: Commercial Managed Care - PPO | Admitting: Family

## 2023-12-04 ENCOUNTER — Encounter: Payer: Self-pay | Admitting: Family

## 2023-12-04 ENCOUNTER — Ambulatory Visit: Payer: Commercial Managed Care - PPO | Admitting: Family

## 2023-12-04 ENCOUNTER — Ambulatory Visit: Admitting: Family

## 2023-12-04 VITALS — BP 104/64 | HR 56 | Ht 65.0 in | Wt 137.8 lb

## 2023-12-04 DIAGNOSIS — M797 Fibromyalgia: Secondary | ICD-10-CM

## 2023-12-04 DIAGNOSIS — G8929 Other chronic pain: Secondary | ICD-10-CM

## 2023-12-04 DIAGNOSIS — R202 Paresthesia of skin: Secondary | ICD-10-CM | POA: Diagnosis not present

## 2023-12-04 DIAGNOSIS — R531 Weakness: Secondary | ICD-10-CM

## 2023-12-04 DIAGNOSIS — R519 Headache, unspecified: Secondary | ICD-10-CM | POA: Diagnosis not present

## 2023-12-04 DIAGNOSIS — G479 Sleep disorder, unspecified: Secondary | ICD-10-CM

## 2023-12-04 DIAGNOSIS — M5442 Lumbago with sciatica, left side: Secondary | ICD-10-CM

## 2023-12-04 DIAGNOSIS — G939 Disorder of brain, unspecified: Secondary | ICD-10-CM | POA: Diagnosis not present

## 2023-12-04 DIAGNOSIS — F411 Generalized anxiety disorder: Secondary | ICD-10-CM

## 2023-12-04 DIAGNOSIS — J069 Acute upper respiratory infection, unspecified: Secondary | ICD-10-CM

## 2023-12-04 DIAGNOSIS — Z0130 Encounter for examination of blood pressure without abnormal findings: Secondary | ICD-10-CM

## 2023-12-04 DIAGNOSIS — M5441 Lumbago with sciatica, right side: Secondary | ICD-10-CM

## 2023-12-04 DIAGNOSIS — F900 Attention-deficit hyperactivity disorder, predominantly inattentive type: Secondary | ICD-10-CM

## 2023-12-04 LAB — POCT XPERT XPRESS SARS COVID-2/FLU/RSV
FLU A: NEGATIVE
FLU A: NEGATIVE
FLU B: NEGATIVE
FLU B: NEGATIVE
RSV RNA, PCR: NEGATIVE
RSV RNA, PCR: NEGATIVE
SARS Coronavirus 2: NEGATIVE
SARS Coronavirus 2: NEGATIVE

## 2023-12-04 MED ORDER — HYDROXYZINE HCL 25 MG PO TABS: 25.0000 mg | ORAL_TABLET | Freq: Three times a day (TID) | ORAL | 1 refills | Status: DC | PRN

## 2023-12-04 MED ORDER — TIZANIDINE HCL 4 MG PO TABS: 4.0000 mg | ORAL_TABLET | Freq: Three times a day (TID) | ORAL | 3 refills | Status: DC

## 2023-12-04 MED ORDER — LORAZEPAM 2 MG PO TABS: 2.0000 mg | ORAL_TABLET | Freq: Three times a day (TID) | ORAL | 2 refills | Status: DC | PRN

## 2023-12-04 MED ORDER — HYDROCODONE-ACETAMINOPHEN 10-325 MG PO TABS: 1.0000 | ORAL_TABLET | Freq: Three times a day (TID) | ORAL | 0 refills | Status: DC | PRN

## 2023-12-04 MED ORDER — AMPHETAMINE-DEXTROAMPHETAMINE 20 MG PO TABS: 20.0000 mg | ORAL_TABLET | Freq: Two times a day (BID) | ORAL | 0 refills | Status: DC

## 2023-12-04 NOTE — Progress Notes (Signed)
 Established Patient Office Visit  Subjective:  Patient ID: Sandra Aguilar, female    DOB: 1976-07-26  Age: 48 y.o. MRN: [id number]  Chief Complaint  Patient presents with   Hospitalization Follow-up    Pain management still has not called Needs Neuro to set up appointment as well.   Checking B12  Flu/COVID/RSV test running   Cardiology EP  Asks if we can Increase anxiety meds.    No other concerns at this time.   Past Medical History:  Diagnosis Date   Cough 07/16/2020   Sandra Aguilar presents via virtual visit with complaints of cough with dark yellow and green mucous, nasal congestion and stuffiness, low grade fevers, sore throat, nausea, vomiting, and recent sick exposure. She had a recent negative covid test last week, and started Augmentin for a URI after her negative Covid results.  She states she started feeling a little better then started feeling bad again.     Depression    Family history of breast cancer 05/27/2018   Fibromyalgia    Insomnia    Lung nodule    Lung nodule, solitary 09/04/2020   PTSD (post-traumatic stress disorder)    Strep pharyngitis 02/17/2021   Last Assessment & Plan: Formatting of this note might be different from the original. Will not check a rapid strep test for her sore throat due to her husband was positive for strep throat diagnosed yesterday in the same clinic.  Will prescribe Pen-Vee K 500 mg 1 every 12 hours for 10 days.  Be sure to drink plenty of fluids including Gatorade, take Tylenol or Advil for pain or fever as needed.  I   Stroke [facility])    Suspected COVID-19 virus infection 06/29/2020   Formatting of this note might be different from the original. Sandra Aguilar presents via virtual visit with complaints of cough with dark yellow and green mucous, nasal congestion and stuffiness, low grade fevers, sore throat, nausea, vomiting, and recent sick exposure. She had a recent negative covid test last week, and started Augmentin for a URI after her  negative Covid results.  She states she start   Thyroid nodule     Past Surgical History:  Procedure Laterality Date   APPENDECTOMY     CHOLECYSTECTOMY      Social History   Socioeconomic History   Marital status: Married    Spouse name: Not on file   Number of children: Not on file   Years of education: Not on file   Highest education level: Not on file  Occupational History   Not on file  Tobacco Use   Smoking status: Never    Passive exposure: Never   Smokeless tobacco: Never  Vaping Use   Vaping status: Never Used  Substance and Sexual Activity   Alcohol use: Not Currently    Comment: ocassionally   Drug use: Never   Sexual activity: Yes    Birth control/protection: None, Other-see comments    Comment: husband has vasectomy  Other Topics Concern   Not on file  Social History Narrative   Not on file   Social Drivers of Health   Financial Resource Strain: Not on file  Food Insecurity: Not on file  Transportation Needs: Not on file  Physical Activity: Not on file  Stress: Not on file  Social Connections: Unknown (05/08/2020)   Received from [facility], [facility]   Social Connections    Frequency of Communication with Friends and Family: Not asked    Frequency of Social  Gatherings with Friends and Family: Not asked  Intimate Partner Violence: Unknown (05/08/2020)   Received from [facility], [facility]   Intimate Partner Violence    Fear of Current or Ex-Partner: Not asked    Emotionally Abused: Not asked    Physically Abused: Not asked    Sexually Abused: Not asked    Family History  Problem Relation Age of Onset   Heart attack Father    Vascular Disease Father    Vascular Disease Maternal Uncle    Obesity Paternal Aunt    Vascular Disease Paternal Aunt    Heart attack Maternal Grandmother    Ovarian cancer Maternal Grandmother    Vascular Disease Maternal Grandfather    Stroke Paternal Grandmother     Allergies  Allergen Reactions    Ciprofloxacin Nausea And Vomiting and Other (See Comments)    Neuropathy  Neuropathy  Neuropathy  Neuropathy  Neuropathy   Nitrofurantoin Hives    Other reaction(s): Hives/Swelling-Allergy   Promethazine Other (See Comments)    "Childhood," pt stated she has tolerated since   Sulfa Antibiotics Nausea And Vomiting, Other (See Comments) and Rash    CAUSES PAIN, PT REPORTS    Review of Systems  Constitutional:  Positive for fever (recurrent) and malaise/fatigue.  Musculoskeletal:  Positive for joint pain and myalgias.  Neurological:  Positive for headaches.  All other systems reviewed and are negative.      Objective:   BP 104/64   Pulse (!) 56   Ht 5\' 5"  (1.651 m)   Wt 137 lb 12.8 oz (62.5 kg)   SpO2 98%   BMI 22.93 kg/m   Vitals:   12/04/23 1517  BP: 104/64  Pulse: (!) 56  Height: 5\' 5"  (1.651 m)  Weight: 137 lb 12.8 oz (62.5 kg)  SpO2: 98%  BMI (Calculated): 22.93    Physical Exam Vitals and nursing note reviewed.  Constitutional:      Appearance: Normal appearance. She is normal weight. She is ill-appearing. She is not toxic-appearing or diaphoretic.  HENT:     Head: Normocephalic.  Eyes:     Extraocular Movements: Extraocular movements intact.     Conjunctiva/sclera: Conjunctivae normal.     Pupils: Pupils are equal, round, and reactive to light.  Cardiovascular:     Rate and Rhythm: Normal rate.  Pulmonary:     Effort: Pulmonary effort is normal.  Musculoskeletal:        General: Tenderness present. Normal range of motion.  Neurological:     General: No focal deficit present.     Mental Status: She is alert and oriented to person, place, and time. Mental status is at baseline.  Psychiatric:        Attention and Perception: Attention and perception normal.        Mood and Affect: Mood is anxious and depressed. Affect is tearful.        Behavior: Behavior normal.        Thought Content: Thought content normal.        Judgment: Judgment normal.       Results for orders placed or performed in visit on 12/04/23  CBC with Diff  Result Value Ref Range   WBC 7.4 3.4 - 10.8 x10E3/uL   RBC 3.93 3.77 - 5.28 x10E6/uL   Hemoglobin 12.2 11.1 - 15.9 g/dL   Hematocrit 96.2 95.2 - 46.6 %   MCV 94 79 - 97 fL   MCH 31.0 26.6 - 33.0 pg   MCHC 32.9  31.5 - 35.7 g/dL   RDW 16.1 09.6 - 04.5 %   Platelets 219 150 - 450 x10E3/uL   Neutrophils 54 Not Estab. %   Lymphs 34 Not Estab. %   Monocytes 8 Not Estab. %   Eos 3 Not Estab. %   Basos 1 Not Estab. %   Neutrophils Absolute 4.0 1.4 - 7.0 x10E3/uL   Lymphocytes Absolute 2.6 0.7 - 3.1 x10E3/uL   Monocytes Absolute 0.6 0.1 - 0.9 x10E3/uL   EOS (ABSOLUTE) 0.2 0.0 - 0.4 x10E3/uL   Basophils Absolute 0.0 0.0 - 0.2 x10E3/uL   Immature Granulocytes 0 Not Estab. %   Immature Grans (Abs) 0.0 0.0 - 0.1 x10E3/uL  CMP14+EGFR  Result Value Ref Range   Glucose 80 70 - 99 mg/dL   BUN 10 6 - 24 mg/dL   Creatinine, Ser 4.09 0.57 - 1.00 mg/dL   eGFR 811 >91 YN/WGN/5.62   BUN/Creatinine Ratio 14 9 - 23   Sodium 137 134 - 144 mmol/L   Potassium 4.3 3.5 - 5.2 mmol/L   Chloride 100 96 - 106 mmol/L   CO2 23 20 - 29 mmol/L   Calcium 10.2 8.7 - 10.2 mg/dL   Total Protein 6.9 6.0 - 8.5 g/dL   Albumin 4.4 3.9 - 4.9 g/dL   Globulin, Total 2.5 1.5 - 4.5 g/dL   Bilirubin Total 0.3 0.0 - 1.2 mg/dL   Alkaline Phosphatase 49 44 - 121 IU/L   AST 16 0 - 40 IU/L   ALT 14 0 - 32 IU/L  TSH+T4F+T3Free  Result Value Ref Range   TSH 1.030 0.450 - 4.500 uIU/mL   T3, Free 3.1 2.0 - 4.4 pg/mL   Free T4 1.05 0.82 - 1.77 ng/dL  Vitamin Z30  Result Value Ref Range   Vitamin B-12 509 232 - 1,245 pg/mL  RBC Folate  Result Value Ref Range   Folate, Hemolysate 327.0 Not Estab. ng/mL   Folate, RBC 881 >498 ng/mL  Iron, TIBC and Ferritin Panel  Result Value Ref Range   Total Iron Binding Capacity 335 250 - 450 ug/dL   UIBC 865 784 - 696 ug/dL   Iron 79 27 - 295 ug/dL   Iron Saturation 24 15 - 55 %   Ferritin 21 15 -  150 ng/mL  POCT XPERT XPRESS SARS COVID-2/FLU/RSV  Result Value Ref Range   SARS Coronavirus 2 neg    FLU A neg    FLU B neg    RSV RNA, PCR neg     Recent Results (from the past 2160 hours)  Basic metabolic panel     Status: Abnormal   Collection Time: 11/22/23  5:53 PM  Result Value Ref Range   Sodium 136 135 - 145 mmol/L   Potassium 4.5 3.5 - 5.1 mmol/L   Chloride 100 98 - 111 mmol/L   CO2 24 22 - 32 mmol/L   Glucose, Bld 212 (H) 70 - 99 mg/dL    Comment: Glucose reference range applies only to samples taken after fasting for at least 8 hours.   BUN 10 6 - 20 mg/dL   Creatinine, Ser 2.84 0.44 - 1.00 mg/dL   Calcium 9.1 8.9 - 13.2 mg/dL   GFR, Estimated >44 >01 mL/min    Comment: (NOTE) Calculated using the CKD-EPI Creatinine Equation (2021)    Anion gap 12 5 - 15    Comment: Performed at [facility], [address], [place], [place] [address]  CBC     Status:  None   Collection Time: 11/22/23  5:53 PM  Result Value Ref Range   WBC 8.7 4.0 - 10.5 K/uL   RBC 4.00 3.87 - 5.11 MIL/uL   Hemoglobin 12.5 12.0 - 15.0 g/dL   HCT 82.9 56.2 - 13.0 %   MCV 92.5 80.0 - 100.0 fL   MCH 31.3 26.0 - 34.0 pg   MCHC 33.8 30.0 - 36.0 g/dL   RDW 86.5 78.4 - 69.6 %   Platelets 236 150 - 400 K/uL   nRBC 0.0 0.0 - 0.2 %    Comment: Performed at [facility], [address] [place], [place] [address]  Troponin I (High Sensitivity)     Status: None   Collection Time: 11/22/23  5:53 PM  Result Value Ref Range   Troponin I (High Sensitivity) 3 <18 ng/L    Comment: (NOTE) Elevated high sensitivity troponin I (hsTnI) values and significant  changes across serial measurements may suggest ACS but many other  chronic and acute conditions are known to elevate hsTnI results.  Refer to the "Links" section for chest pain algorithms and additional  guidance. Performed at [facility], [address] [place], [place] [address]   POC urine preg, ED     Status: None    Collection Time: 11/22/23  6:01 PM  Result Value Ref Range   Preg Test, Ur NEGATIVE NEGATIVE    Comment:        THE SENSITIVITY OF THIS METHODOLOGY IS >24 mIU/mL   POCT XPERT XPRESS SARS COVID-2/FLU/RSV     Status: None   Collection Time: 12/04/23  3:52 PM  Result Value Ref Range   SARS Coronavirus 2 neg    FLU A neg    FLU B neg    RSV RNA, PCR neg   CBC with Diff     Status: None   Collection Time: 12/04/23  3:53 PM  Result Value Ref Range   WBC 7.4 3.4 - 10.8 x10E3/uL   RBC 3.93 3.77 - 5.28 x10E6/uL   Hemoglobin 12.2 11.1 - 15.9 g/dL   Hematocrit 40.1 02.7 - 46.6 %   MCV 94 79 - 97 fL   MCH 31.0 26.6 - 33.0 pg   MCHC 32.9 31.5 - 35.7 g/dL   RDW 25.3 66.4 - 40.3 %   Platelets 219 150 - 450 x10E3/uL   Neutrophils 54 Not Estab. %   Lymphs 34 Not Estab. %   Monocytes 8 Not Estab. %   Eos 3 Not Estab. %   Basos 1 Not Estab. %   Neutrophils Absolute 4.0 1.4 - 7.0 x10E3/uL   Lymphocytes Absolute 2.6 0.7 - 3.1 x10E3/uL   Monocytes Absolute 0.6 0.1 - 0.9 x10E3/uL   EOS (ABSOLUTE) 0.2 0.0 - 0.4 x10E3/uL   Basophils Absolute 0.0 0.0 - 0.2 x10E3/uL   Immature Granulocytes 0 Not Estab. %   Immature Grans (Abs) 0.0 0.0 - 0.1 x10E3/uL  CMP14+EGFR     Status: None   Collection Time: 12/04/23  3:53 PM  Result Value Ref Range   Glucose 80 70 - 99 mg/dL   BUN 10 6 - 24 mg/dL   Creatinine, Ser 4.74 0.57 - 1.00 mg/dL   eGFR 259 >56 LO/VFI/4.33   BUN/Creatinine Ratio 14 9 - 23   Sodium 137 134 - 144 mmol/L   Potassium 4.3 3.5 - 5.2 mmol/L   Chloride 100 96 - 106 mmol/L   CO2 23 20 - 29 mmol/L   Calcium 10.2 8.7 -  10.2 mg/dL   Total Protein 6.9 6.0 - 8.5 g/dL   Albumin 4.4 3.9 - 4.9 g/dL   Globulin, Total 2.5 1.5 - 4.5 g/dL   Bilirubin Total 0.3 0.0 - 1.2 mg/dL   Alkaline Phosphatase 49 44 - 121 IU/L   AST 16 0 - 40 IU/L   ALT 14 0 - 32 IU/L  TSH+T4F+T3Free     Status: None   Collection Time: 12/04/23  3:53 PM  Result Value Ref Range   TSH 1.030 0.450 - 4.500 uIU/mL   T3,  Free 3.1 2.0 - 4.4 pg/mL   Free T4 1.05 0.82 - 1.77 ng/dL  Vitamin Z61     Status: None   Collection Time: 12/04/23  3:53 PM  Result Value Ref Range   Vitamin B-12 509 232 - 1,245 pg/mL  RBC Folate     Status: None   Collection Time: 12/04/23  3:53 PM  Result Value Ref Range   Folate, Hemolysate 327.0 Not Estab. ng/mL   Folate, RBC 881 >498 ng/mL  Iron, TIBC and Ferritin Panel     Status: None   Collection Time: 12/04/23  3:53 PM  Result Value Ref Range   Total Iron Binding Capacity 335 250 - 450 ug/dL   UIBC 096 045 - 409 ug/dL   Iron 79 27 - 811 ug/dL   Iron Saturation 24 15 - 55 %   Ferritin 21 15 - 150 ng/mL  POCT XPERT XPRESS SARS COVID-2/FLU/RSV     Status: None   Collection Time: 12/04/23  4:06 PM  Result Value Ref Range   SARS Coronavirus 2 neg    FLU A neg    FLU B neg    RSV RNA, PCR neg   POCT XPERT XPRESS SARS COVID-2/FLU/RSV     Status: None   Collection Time: 01/03/24  3:47 PM  Result Value Ref Range   SARS Coronavirus 2 negative    FLU A negative    FLU B negative    RSV RNA, PCR negative        Assessment & Plan:   Problem List Items Addressed This Visit       Nervous and Auditory   Chronic bilateral low back pain with bilateral sciatica   Patient stable.  Well controlled with current therapy.   Continue current meds.        Relevant Medications   LORazepam (ATIVAN) 2 MG tablet   Cerebellar lesion   Working to get MRI repeat given her previous history of this.  She will call to get this rescheduled.    Sending referral to Neuro.      Relevant Orders   CBC with Diff (Completed)   CMP14+EGFR (Completed)   TSH+T4F+T3Free (Completed)   Vitamin B12 (Completed)   RBC Folate (Completed)   Iron, TIBC and Ferritin Panel (Completed)     Other   Fibromyalgia   Patient stable.  Well controlled with current therapy.   Continue current meds.        Attention deficit hyperactivity disorder (ADHD), inattentive type, moderate   Patient  stable.  Well controlled with current therapy.   Continue current meds.        Generalized anxiety disorder   Patient stable.  Well controlled with current therapy.   Continue current meds.        Relevant Medications   LORazepam (ATIVAN) 2 MG tablet   Sleep disorder   Setting pt up for sleep study. Will await results for further steps.  Other Visit Diagnoses       Weakness    -  Primary   Relevant Orders   CBC with Diff (Completed)   CMP14+EGFR (Completed)   TSH+T4F+T3Free (Completed)   Vitamin B12 (Completed)   RBC Folate (Completed)   Iron, TIBC and Ferritin Panel (Completed)   POCT XPERT XPRESS SARS COVID-2/FLU/RSV (Completed)     Acute intractable headache, unspecified headache type       Relevant Orders   CBC with Diff (Completed)   CMP14+EGFR (Completed)   TSH+T4F+T3Free (Completed)   Vitamin B12 (Completed)   RBC Folate (Completed)   Iron, TIBC and Ferritin Panel (Completed)   POCT XPERT XPRESS SARS COVID-2/FLU/RSV (Completed)     Paresthesia       Relevant Orders   CBC with Diff (Completed)   CMP14+EGFR (Completed)   TSH+T4F+T3Free (Completed)   Vitamin B12 (Completed)   RBC Folate (Completed)   Iron, TIBC and Ferritin Panel (Completed)       Return in about 2 weeks (around 12/18/2023).   Total time spent: 30 minutes  Miki Kins, FNP  12/04/2023   This document may have been prepared by [facility] Voice Recognition software and as such may include unintentional dictation errors.

## 2023-12-05 LAB — CBC WITH DIFFERENTIAL/PLATELET
Basophils Absolute: 0 10*3/uL (ref 0.0–0.2)
Basos: 1 %
EOS (ABSOLUTE): 0.2 10*3/uL (ref 0.0–0.4)
Eos: 3 %
Hematocrit: 37.1 % (ref 34.0–46.6)
Hemoglobin: 12.2 g/dL (ref 11.1–15.9)
Immature Grans (Abs): 0 10*3/uL (ref 0.0–0.1)
Immature Granulocytes: 0 %
Lymphocytes Absolute: 2.6 10*3/uL (ref 0.7–3.1)
Lymphs: 34 %
MCH: 31 pg (ref 26.6–33.0)
MCHC: 32.9 g/dL (ref 31.5–35.7)
MCV: 94 fL (ref 79–97)
Monocytes Absolute: 0.6 10*3/uL (ref 0.1–0.9)
Monocytes: 8 %
Neutrophils Absolute: 4 10*3/uL (ref 1.4–7.0)
Neutrophils: 54 %
Platelets: 219 10*3/uL (ref 150–450)
RBC: 3.93 x10E6/uL (ref 3.77–5.28)
RDW: 12.7 % (ref 11.7–15.4)
WBC: 7.4 10*3/uL (ref 3.4–10.8)

## 2023-12-05 LAB — CMP14+EGFR
ALT: 14 [IU]/L (ref 0–32)
AST: 16 [IU]/L (ref 0–40)
Albumin: 4.4 g/dL (ref 3.9–4.9)
Alkaline Phosphatase: 49 [IU]/L (ref 44–121)
BUN/Creatinine Ratio: 14 (ref 9–23)
BUN: 10 mg/dL (ref 6–24)
Bilirubin Total: 0.3 mg/dL (ref 0.0–1.2)
CO2: 23 mmol/L (ref 20–29)
Calcium: 10.2 mg/dL (ref 8.7–10.2)
Chloride: 100 mmol/L (ref 96–106)
Creatinine, Ser: 0.71 mg/dL (ref 0.57–1.00)
Globulin, Total: 2.5 g/dL (ref 1.5–4.5)
Glucose: 80 mg/dL (ref 70–99)
Potassium: 4.3 mmol/L (ref 3.5–5.2)
Sodium: 137 mmol/L (ref 134–144)
Total Protein: 6.9 g/dL (ref 6.0–8.5)
eGFR: 105 mL/min/{1.73_m2} (ref >59)

## 2023-12-05 LAB — IRON,TIBC AND FERRITIN PANEL
Ferritin: 21 ng/mL (ref 15–150)
Iron Saturation: 24 % (ref 15–55)
Iron: 79 ug/dL (ref 27–159)
Total Iron Binding Capacity: 335 ug/dL (ref 250–450)
UIBC: 256 ug/dL (ref 131–425)

## 2023-12-05 LAB — FOLATE RBC
Folate, Hemolysate: 327 ng/mL
Folate, RBC: 881 ng/mL (ref >498)

## 2023-12-05 LAB — VITAMIN B12: Vitamin B-12: 509 pg/mL (ref 232–1245)

## 2023-12-05 LAB — TSH+T4F+T3FREE
Free T4: 1.05 ng/dL (ref 0.82–1.77)
T3, Free: 3.1 pg/mL (ref 2.0–4.4)
TSH: 1.03 u[IU]/mL (ref 0.450–4.500)

## 2023-12-11 ENCOUNTER — Telehealth: Payer: Self-pay | Admitting: Family

## 2023-12-11 NOTE — Telephone Encounter (Signed)
 Patient left VM that her HR is still running low and she has low energy. She wants to know if we can rush the referral to cardio. Please complete referral ASAP.

## 2023-12-13 ENCOUNTER — Other Ambulatory Visit: Payer: Self-pay | Admitting: Family

## 2023-12-13 MED ORDER — TIZANIDINE HCL 4 MG PO TABS: 4.0000 mg | ORAL_TABLET | Freq: Three times a day (TID) | ORAL | 3 refills | Status: DC

## 2023-12-14 ENCOUNTER — Encounter: Payer: Self-pay | Admitting: Cardiovascular Disease

## 2023-12-14 ENCOUNTER — Ambulatory Visit (INDEPENDENT_AMBULATORY_CARE_PROVIDER_SITE_OTHER): Payer: Commercial Managed Care - PPO | Admitting: Cardiovascular Disease

## 2023-12-14 VITALS — BP 110/71 | HR 78 | Ht 65.0 in | Wt 139.0 lb

## 2023-12-14 DIAGNOSIS — Z0130 Encounter for examination of blood pressure without abnormal findings: Secondary | ICD-10-CM

## 2023-12-14 DIAGNOSIS — R55 Syncope and collapse: Secondary | ICD-10-CM | POA: Diagnosis not present

## 2023-12-14 DIAGNOSIS — R0602 Shortness of breath: Secondary | ICD-10-CM

## 2023-12-14 DIAGNOSIS — R42 Dizziness and giddiness: Secondary | ICD-10-CM

## 2023-12-14 DIAGNOSIS — R0789 Other chest pain: Secondary | ICD-10-CM | POA: Diagnosis not present

## 2023-12-14 DIAGNOSIS — R002 Palpitations: Secondary | ICD-10-CM

## 2023-12-14 DIAGNOSIS — R001 Bradycardia, unspecified: Secondary | ICD-10-CM

## 2023-12-14 NOTE — Progress Notes (Signed)
Cardiology Office Note   Date:  12/14/2023   ID:  Sandra Aguilar, DOB 1976-07-03, MRN [id number]  PCP:  Miki Kins, FNP  Cardiologist:  Adrian Blackwater, MD      History of Present Illness: Sandra Aguilar is a 48 y.o. female who presents for No chief complaint on file.   48 YOWF says having palpitation, chest pain and leg pains along with coughing and SOB. My hearts keep droping, and went to ER, at [facility]. Has strong family h/o premature  MI/CAD. Had Heart rate dro at times in 30s and felt like passing out. Had post-covid syndrome.  Chest Pain  This is a new problem. The current episode started 1 to 4 weeks ago. The problem has been rapidly worsening. The pain is at a severity of 5/10. The quality of the pain is described as heavy. Associated symptoms include a cough, dizziness, irregular heartbeat, leg pain, palpitations, syncope and weakness.      Past Medical History:  Diagnosis Date   Cough 07/16/2020   Sandra Aguilar presents via virtual visit with complaints of cough with dark yellow and green mucous, nasal congestion and stuffiness, low grade fevers, sore throat, nausea, vomiting, and recent sick exposure. She had a recent negative covid test last week, and started Augmentin for a URI after her negative Covid results.  She states she started feeling a little better then started feeling bad again.     Depression    Family history of breast cancer 05/27/2018   Fibromyalgia    Insomnia    Lung nodule    Lung nodule, solitary 09/04/2020   PTSD (post-traumatic stress disorder)    Strep pharyngitis 02/17/2021   Last Assessment & Plan: Formatting of this note might be different from the original. Will not check a rapid strep test for her sore throat due to her husband was positive for strep throat diagnosed yesterday in the same clinic.  Will prescribe Pen-Vee K 500 mg 1 every 12 hours for 10 days.  Be sure to drink plenty of fluids including Gatorade, take Tylenol or Advil for pain or  fever as needed.  I   Stroke [facility])    Suspected COVID-19 virus infection 06/29/2020   Formatting of this note might be different from the original. Sandra Aguilar presents via virtual visit with complaints of cough with dark yellow and green mucous, nasal congestion and stuffiness, low grade fevers, sore throat, nausea, vomiting, and recent sick exposure. She had a recent negative covid test last week, and started Augmentin for a URI after her negative Covid results.  She states she start   Thyroid nodule      Past Surgical History:  Procedure Laterality Date   APPENDECTOMY     CHOLECYSTECTOMY       Current Outpatient Medications  Medication Sig Dispense Refill   albuterol (VENTOLIN HFA) 108 (90 Base) MCG/ACT inhaler Inhale 1-2 puffs into the lungs every 6 (six) hours as needed for wheezing or shortness of breath. 18 each 3   amphetamine-dextroamphetamine (ADDERALL) 20 MG tablet Take 1 tablet (20 mg total) by mouth 2 (two) times daily. 60 tablet 0   brompheniramine-pseudoephedrine-DM 30-2-10 MG/5ML syrup Take 5 mLs by mouth 4 (four) times daily as needed. (Patient not taking: Reported on 12/04/2023) 120 mL 0   fluticasone (FLONASE) 50 MCG/ACT nasal spray Place 2 sprays into both nostrils daily. 16 g 0   gabapentin (NEURONTIN) 600 MG tablet Take 1 tablet (600 mg total) by mouth 2 (two) times daily.  180 tablet 0   HYDROcodone-acetaminophen (NORCO) 10-325 MG tablet Take 1 tablet by mouth every 8 (eight) hours as needed for severe pain (pain score 7-10). 90 tablet 0   hydrOXYzine (ATARAX) 25 MG tablet Take 1 tablet (25 mg total) by mouth 3 (three) times daily as needed for itching or anxiety. 90 tablet 1   hyoscyamine (LEVSIN SL) 0.125 MG SL tablet Place 1 tablet (0.125 mg total) under the tongue every 4 (four) hours as needed. 30 tablet 0   LORazepam (ATIVAN) 2 MG tablet Take 1 tablet (2 mg total) by mouth every 8 (eight) hours as needed for anxiety. 90 tablet 2   ondansetron (ZOFRAN-ODT) 8 MG  disintegrating tablet Take 1 tablet (8 mg total) by mouth every 8 (eight) hours as needed for nausea or vomiting. 20 tablet 0   tiZANidine (ZANAFLEX) 4 MG tablet Take 1 tablet (4 mg total) by mouth 3 (three) times daily. 90 tablet 3   trazodone (DESYREL) 300 MG tablet Take 1 tablet (300 mg total) by mouth at bedtime. 90 tablet 0   No current facility-administered medications for this visit.    Allergies:   Ciprofloxacin, Nitrofurantoin, Promethazine, and Sulfa antibiotics    Social History:   reports that she has never smoked. She has never been exposed to tobacco smoke. She has never used smokeless tobacco. She reports that she does not currently use alcohol. She reports that she does not use drugs.   Family History:  family history includes Heart attack in her father and maternal grandmother; Obesity in her paternal aunt; Ovarian cancer in her maternal grandmother; Stroke in her paternal grandmother; Vascular Disease in her father, maternal grandfather, maternal uncle, and paternal aunt.    ROS:     Review of Systems  Constitutional: Negative.   HENT: Negative.    Eyes: Negative.   Respiratory:  Positive for cough.   Cardiovascular:  Positive for chest pain, palpitations and syncope.  Gastrointestinal: Negative.   Genitourinary: Negative.   Musculoskeletal: Negative.   Skin: Negative.   Neurological:  Positive for dizziness and weakness.  Endo/Heme/Allergies: Negative.   Psychiatric/Behavioral: Negative.    All other systems reviewed and are negative.     All other systems are reviewed and negative.    PHYSICAL EXAM: VS:  BP 110/71   Pulse 78   Ht 5\' 5"  (1.651 m)   Wt 139 lb (63 kg)   SpO2 99%   BMI 23.13 kg/m  , BMI Body mass index is 23.13 kg/m. Last weight:  Wt Readings from Last 3 Encounters:  12/14/23 139 lb (63 kg)  12/04/23 137 lb 12.8 oz (62.5 kg)  11/22/23 130 lb (59 kg)     Physical Exam Constitutional:      Appearance: Normal appearance.   Cardiovascular:     Rate and Rhythm: Normal rate and regular rhythm.     Heart sounds: Normal heart sounds.  Pulmonary:     Effort: Pulmonary effort is normal.     Breath sounds: Normal breath sounds.  Musculoskeletal:     Right lower leg: No edema.     Left lower leg: No edema.  Neurological:     Mental Status: She is alert.       EKG: NSR 65/min OLD IWMI, RAD, non specific st changes  Recent Labs: 12/04/2023: ALT 14; BUN 10; Creatinine, Ser 0.71; Hemoglobin 12.2; Platelets 219; Potassium 4.3; Sodium 137; TSH 1.030    Lipid Panel No results found for: "CHOL", "TRIG", "HDL", "CHOLHDL", "VLDL", "  LDLCALC", "LDLDIRECT"    Other studies Reviewed: Additional studies/ records that were reviewed today include:  Review of the above records demonstrates:       No data to display            ASSESSMENT AND PLAN:    ICD-10-CM   1. Other chest pain  R07.89 PCV ECHOCARDIOGRAM COMPLETE    MYOCARDIAL PERFUSION IMAGING    CARDIAC EVENT MONITOR   Went to ER with chest pain and right arm pain and EKG showed NSR old IWMI, RAD, non specific st changes on 11/23/23 ER visiit, advise echo/stress test.    2. SOB (shortness of breath)  R06.02 PCV ECHOCARDIOGRAM COMPLETE    MYOCARDIAL PERFUSION IMAGING    CARDIAC EVENT MONITOR    3. Syncope, unspecified syncope type  R55 PCV ECHOCARDIOGRAM COMPLETE    MYOCARDIAL PERFUSION IMAGING    CARDIAC EVENT MONITOR    4. Dizziness  R42 PCV ECHOCARDIOGRAM COMPLETE    MYOCARDIAL PERFUSION IMAGING    CARDIAC EVENT MONITOR    5. Palpitations  R00.2 PCV ECHOCARDIOGRAM COMPLETE    MYOCARDIAL PERFUSION IMAGING    CARDIAC EVENT MONITOR    6. Sinus bradycardia  R00.1 PCV ECHOCARDIOGRAM COMPLETE    MYOCARDIAL PERFUSION IMAGING    CARDIAC EVENT MONITOR   At times heart rate in 30s, will do holter       Problem List Items Addressed This Visit       Other   Palpitations   Relevant Orders   PCV ECHOCARDIOGRAM COMPLETE   MYOCARDIAL PERFUSION  IMAGING   CARDIAC EVENT MONITOR   Other Visit Diagnoses       Other chest pain    -  Primary   Went to ER with chest pain and right arm pain and EKG showed NSR old IWMI, RAD, non specific st changes on 11/23/23 ER visiit, advise echo/stress test.   Relevant Orders   PCV ECHOCARDIOGRAM COMPLETE   MYOCARDIAL PERFUSION IMAGING   CARDIAC EVENT MONITOR     SOB (shortness of breath)       Relevant Orders   PCV ECHOCARDIOGRAM COMPLETE   MYOCARDIAL PERFUSION IMAGING   CARDIAC EVENT MONITOR     Syncope, unspecified syncope type       Relevant Orders   PCV ECHOCARDIOGRAM COMPLETE   MYOCARDIAL PERFUSION IMAGING   CARDIAC EVENT MONITOR     Dizziness       Relevant Orders   PCV ECHOCARDIOGRAM COMPLETE   MYOCARDIAL PERFUSION IMAGING   CARDIAC EVENT MONITOR     Sinus bradycardia       At times heart rate in 30s, will do holter   Relevant Orders   PCV ECHOCARDIOGRAM COMPLETE   MYOCARDIAL PERFUSION IMAGING   CARDIAC EVENT MONITOR          Disposition:   Return in about 5 weeks (around 01/18/2024) for Holter, echo, and stress test.    Total time spent: 50 minutes  Signed,  Adrian Blackwater, MD  12/14/2023 1:36 PM    [facility]

## 2023-12-18 ENCOUNTER — Ambulatory Visit: Payer: Commercial Managed Care - PPO | Admitting: Family

## 2023-12-22 NOTE — Telephone Encounter (Signed)
 Sandra Aguilar, patient declined Counseling referral.  She was referred last year and states that she called and they were not in Network with her insurance, she never called to let us know. I contacted her as part of referral follow up, she stated she was never seen. I asked her if she still wanted to the referral and gave her some options of places to call to schedule and she declined, stating NO to the referral still being needed.

## 2023-12-25 ENCOUNTER — Other Ambulatory Visit: Payer: Self-pay | Admitting: Family

## 2023-12-25 DIAGNOSIS — G479 Sleep disorder, unspecified: Secondary | ICD-10-CM

## 2023-12-25 NOTE — Telephone Encounter (Signed)
 Thanks for the update.  Looks like she has a new PCP anyways, probably doesn't understand about the transfer but it's listed as Sandra Aguilar so I will leave it as  is.

## 2023-12-26 NOTE — Telephone Encounter (Signed)
 Patient see cardiologist here at [facility]

## 2023-12-27 ENCOUNTER — Ambulatory Visit: Payer: Commercial Managed Care - PPO

## 2023-12-27 DIAGNOSIS — I371 Nonrheumatic pulmonary valve insufficiency: Secondary | ICD-10-CM | POA: Diagnosis not present

## 2023-12-27 DIAGNOSIS — I361 Nonrheumatic tricuspid (valve) insufficiency: Secondary | ICD-10-CM | POA: Diagnosis not present

## 2023-12-27 DIAGNOSIS — I340 Nonrheumatic mitral (valve) insufficiency: Secondary | ICD-10-CM | POA: Diagnosis not present

## 2023-12-27 DIAGNOSIS — R42 Dizziness and giddiness: Secondary | ICD-10-CM

## 2023-12-27 DIAGNOSIS — R0789 Other chest pain: Secondary | ICD-10-CM

## 2023-12-27 DIAGNOSIS — R002 Palpitations: Secondary | ICD-10-CM

## 2023-12-27 DIAGNOSIS — R0602 Shortness of breath: Secondary | ICD-10-CM

## 2023-12-27 DIAGNOSIS — R55 Syncope and collapse: Secondary | ICD-10-CM

## 2023-12-27 DIAGNOSIS — R001 Bradycardia, unspecified: Secondary | ICD-10-CM

## 2023-12-28 ENCOUNTER — Encounter: Payer: Self-pay | Admitting: Family

## 2023-12-28 NOTE — Progress Notes (Signed)
 Established Patient Office Visit  Subjective:  Patient ID: Sandra Aguilar, female    DOB: Jun 02, 1976  Age: 48 y.o. MRN: [id number]  Chief Complaint  Patient presents with   Follow-up    1 month    Patient is here today for her 1 month follow up.  She has been feeling poorly since last appointment.   She does have additional concerns to discuss today.  Has been having sinus issues, cough, dizziness, near syncope, severe fatigue.  Labs are due today. She needs refills.   I have reviewed her active problem list, medication list, allergies, notes from last encounter, lab results for her appointment today.      No other concerns at this time.   Past Medical History:  Diagnosis Date   Cough 07/16/2020   Sandra Aguilar presents via virtual visit with complaints of cough with dark yellow and green mucous, nasal congestion and stuffiness, low grade fevers, sore throat, nausea, vomiting, and recent sick exposure. She had a recent negative covid test last week, and started Augmentin for a URI after her negative Covid results.  She states she started feeling a little better then started feeling bad again.     Depression    Family history of breast cancer 05/27/2018   Fibromyalgia    Insomnia    Lung nodule    Lung nodule, solitary 09/04/2020   PTSD (post-traumatic stress disorder)    Strep pharyngitis 02/17/2021   Last Assessment & Plan: Formatting of this note might be different from the original. Will not check a rapid strep test for her sore throat due to her husband was positive for strep throat diagnosed yesterday in the same clinic.  Will prescribe Pen-Vee K 500 mg 1 every 12 hours for 10 days.  Be sure to drink plenty of fluids including Gatorade, take Tylenol or Advil for pain or fever as needed.  I   Stroke [facility])    Suspected COVID-19 virus infection 06/29/2020   Formatting of this note might be different from the original. Hula presents via virtual visit with complaints of cough  with dark yellow and green mucous, nasal congestion and stuffiness, low grade fevers, sore throat, nausea, vomiting, and recent sick exposure. She had a recent negative covid test last week, and started Augmentin for a URI after her negative Covid results.  She states she start   Thyroid nodule     Past Surgical History:  Procedure Laterality Date   APPENDECTOMY     CHOLECYSTECTOMY      Social History   Socioeconomic History   Marital status: Married    Spouse name: Not on file   Number of children: Not on file   Years of education: Not on file   Highest education level: Not on file  Occupational History   Not on file  Tobacco Use   Smoking status: Never    Passive exposure: Never   Smokeless tobacco: Never  Vaping Use   Vaping status: Never Used  Substance and Sexual Activity   Alcohol use: Not Currently    Comment: ocassionally   Drug use: Never   Sexual activity: Yes    Birth control/protection: None, Other-see comments    Comment: husband has vasectomy  Other Topics Concern   Not on file  Social History Narrative   Not on file   Social Drivers of Health   Financial Resource Strain: Not on file  Food Insecurity: Not on file  Transportation Needs: Not on file  Physical Activity:  Not on file  Stress: Not on file  Social Connections: Unknown (05/08/2020)   [facility] from [facility]   Social Connections    Frequency of Communication with Friends and Family: Not asked    Frequency of Social Gatherings with Friends and Family: Not asked  Intimate Partner Violence: Unknown (05/08/2020)   Received from [facility]   Intimate Partner Violence    Fear of Current or Ex-Partner: Not asked    Emotionally Abused: Not asked    Physically Abused: Not asked    Sexually Abused: Not asked    Family History  Problem Relation Age of Onset   Heart attack Father    Vascular Disease Father    Vascular Disease Maternal Uncle    Obesity Paternal  Aunt    Vascular Disease Paternal Aunt    Heart attack Maternal Grandmother    Ovarian cancer Maternal Grandmother    Vascular Disease Maternal Grandfather    Stroke Paternal Grandmother     Allergies  Allergen Reactions   Ciprofloxacin Nausea And Vomiting and Other (See Comments)    Neuropathy  Neuropathy  Neuropathy  Neuropathy  Neuropathy   Nitrofurantoin Hives    Other reaction(s): Hives/Swelling-Allergy   Promethazine Other (See Comments)    "Childhood," pt stated she has tolerated since   Sulfa Antibiotics Nausea And Vomiting, Other (See Comments) and Rash    CAUSES PAIN, PT REPORTS    Review of Systems  Constitutional:  Positive for chills, fever and malaise/fatigue.  Respiratory:  Positive for cough and shortness of breath.   All other systems reviewed and are negative.      Objective:   BP 120/80   Pulse 92   Ht 5\' 5"  (1.651 m)   Wt 139 lb 8 oz (63.3 kg)   SpO2 96%   BMI 23.21 kg/m   Vitals:   10/02/23 1528  BP: 120/80  Pulse: 92  Height: 5\' 5"  (1.651 m)  Weight: 139 lb 8 oz (63.3 kg)  SpO2: 96%  BMI (Calculated): 23.21    Physical Exam Vitals and nursing note reviewed.  Constitutional:      Appearance: Normal appearance. She is normal weight.  HENT:     Head: Normocephalic.  Eyes:     Extraocular Movements: Extraocular movements intact.     Conjunctiva/sclera: Conjunctivae normal.     Pupils: Pupils are equal, round, and reactive to light.  Cardiovascular:     Rate and Rhythm: Normal rate.     Pulses: Normal pulses.     Heart sounds: Normal heart sounds.  Pulmonary:     Effort: Pulmonary effort is normal.     Breath sounds: Normal breath sounds.  Musculoskeletal:        General: Normal range of motion.     Cervical back: Normal range of motion.  Skin:    General: Skin is warm.  Neurological:     General: No focal deficit present.     Mental Status: She is alert and oriented to person, place, and time. Mental status is at baseline.   Psychiatric:        Mood and Affect: Mood normal.        Behavior: Behavior normal.        Thought Content: Thought content normal.        Judgment: Judgment normal.      Results for orders placed or performed in visit on 10/02/23  POCT XPERT XPRESS SARS COVID-2/FLU/RSV  Result Value Ref Range  SARS Coronavirus 2 neg    FLU A neg    FLU B neg    RSV RNA, PCR neg     Recent Results (from the past 2160 hours)  Basic metabolic panel     Status: Abnormal   Collection Time: 11/22/23  5:53 PM  Result Value Ref Range   Sodium 136 135 - 145 mmol/L   Potassium 4.5 3.5 - 5.1 mmol/L   Chloride 100 98 - 111 mmol/L   CO2 24 22 - 32 mmol/L   Glucose, Bld 212 (H) 70 - 99 mg/dL    Comment: Glucose reference range applies only to samples taken after fasting for at least 8 hours.   BUN 10 6 - 20 mg/dL   Creatinine, Ser 1.47 0.44 - 1.00 mg/dL   Calcium 9.1 8.9 - 82.9 mg/dL   GFR, Estimated >56 >21 mL/min    Comment: (NOTE) Calculated using the CKD-EPI Creatinine Equation (2021)    Anion gap 12 5 - 15    Comment: Performed at [facility], [address] [place], [place] [address]  CBC     Status: None   Collection Time: 11/22/23  5:53 PM  Result Value Ref Range   WBC 8.7 4.0 - 10.5 K/uL   RBC 4.00 3.87 - 5.11 MIL/uL   Hemoglobin 12.5 12.0 - 15.0 g/dL   HCT 78.4 69.6 - 29.5 %   MCV 92.5 80.0 - 100.0 fL   MCH 31.3 26.0 - 34.0 pg   MCHC 33.8 30.0 - 36.0 g/dL   RDW 28.4 13.2 - 44.0 %   Platelets 236 150 - 400 K/uL   nRBC 0.0 0.0 - 0.2 %    Comment: Performed at [facility], [address] [place], [place] [address]  Troponin I (High Sensitivity)     Status: None   Collection Time: 11/22/23  5:53 PM  Result Value Ref Range   Troponin I (High Sensitivity) 3 <18 ng/L    Comment: (NOTE) Elevated high sensitivity troponin I (hsTnI) values and significant  changes across serial measurements may suggest ACS but many other  chronic and acute conditions are known  to elevate hsTnI results.  Refer to the "Links" section for chest pain algorithms and additional  guidance. Performed at [facility], [address] [place], [place] [address]   POC urine preg, ED     Status: None   Collection Time: 11/22/23  6:01 PM  Result Value Ref Range   Preg Test, Ur NEGATIVE NEGATIVE    Comment:        THE SENSITIVITY OF THIS METHODOLOGY IS >24 mIU/mL   POCT XPERT XPRESS SARS COVID-2/FLU/RSV     Status: None   Collection Time: 12/04/23  3:52 PM  Result Value Ref Range   SARS Coronavirus 2 neg    FLU A neg    FLU B neg    RSV RNA, PCR neg   CBC with Diff     Status: None   Collection Time: 12/04/23  3:53 PM  Result Value Ref Range   WBC 7.4 3.4 - 10.8 x10E3/uL   RBC 3.93 3.77 - 5.28 x10E6/uL   Hemoglobin 12.2 11.1 - 15.9 g/dL   Hematocrit 40.3 47.4 - 46.6 %   MCV 94 79 - 97 fL   MCH 31.0 26.6 - 33.0 pg   MCHC 32.9 31.5 - 35.7 g/dL   RDW 25.9 56.3 - 87.5 %   Platelets 219 150 - 450 x10E3/uL  Neutrophils 54 Not Estab. %   Lymphs 34 Not Estab. %   Monocytes 8 Not Estab. %   Eos 3 Not Estab. %   Basos 1 Not Estab. %   Neutrophils Absolute 4.0 1.4 - 7.0 x10E3/uL   Lymphocytes Absolute 2.6 0.7 - 3.1 x10E3/uL   Monocytes Absolute 0.6 0.1 - 0.9 x10E3/uL   EOS (ABSOLUTE) 0.2 0.0 - 0.4 x10E3/uL   Basophils Absolute 0.0 0.0 - 0.2 x10E3/uL   Immature Granulocytes 0 Not Estab. %   Immature Grans (Abs) 0.0 0.0 - 0.1 x10E3/uL  CMP14+EGFR     Status: None   Collection Time: 12/04/23  3:53 PM  Result Value Ref Range   Glucose 80 70 - 99 mg/dL   BUN 10 6 - 24 mg/dL   Creatinine, Ser 1.30 0.57 - 1.00 mg/dL   eGFR 865 >78 IO/NGE/9.52   BUN/Creatinine Ratio 14 9 - 23   Sodium 137 134 - 144 mmol/L   Potassium 4.3 3.5 - 5.2 mmol/L   Chloride 100 96 - 106 mmol/L   CO2 23 20 - 29 mmol/L   Calcium 10.2 8.7 - 10.2 mg/dL   Total Protein 6.9 6.0 - 8.5 g/dL   Albumin 4.4 3.9 - 4.9 g/dL   Globulin, Total 2.5 1.5 - 4.5 g/dL   Bilirubin Total 0.3 0.0 -  1.2 mg/dL   Alkaline Phosphatase 49 44 - 121 IU/L   AST 16 0 - 40 IU/L   ALT 14 0 - 32 IU/L  TSH+T4F+T3Free     Status: None   Collection Time: 12/04/23  3:53 PM  Result Value Ref Range   TSH 1.030 0.450 - 4.500 uIU/mL   T3, Free 3.1 2.0 - 4.4 pg/mL   Free T4 1.05 0.82 - 1.77 ng/dL  Vitamin W41     Status: None   Collection Time: 12/04/23  3:53 PM  Result Value Ref Range   Vitamin B-12 509 232 - 1,245 pg/mL  RBC Folate     Status: None   Collection Time: 12/04/23  3:53 PM  Result Value Ref Range   Folate, Hemolysate 327.0 Not Estab. ng/mL   Folate, RBC 881 >498 ng/mL  Iron, TIBC and Ferritin Panel     Status: None   Collection Time: 12/04/23  3:53 PM  Result Value Ref Range   Total Iron Binding Capacity 335 250 - 450 ug/dL   UIBC 324 401 - 027 ug/dL   Iron 79 27 - 253 ug/dL   Iron Saturation 24 15 - 55 %   Ferritin 21 15 - 150 ng/mL  POCT XPERT XPRESS SARS COVID-2/FLU/RSV     Status: None   Collection Time: 12/04/23  4:06 PM  Result Value Ref Range   SARS Coronavirus 2 neg    FLU A neg    FLU B neg    RSV RNA, PCR neg        Assessment & Plan:   Problem List Items Addressed This Visit       Other   Moderate episode of recurrent major depressive disorder (HCC)   Patient stable.  Well controlled with current therapy.   Continue current meds.        B12 deficiency   B12 injection given in office today.  Return for next injection per provider instructions.       Other Visit Diagnoses       Upper respiratory tract infection, unspecified type    -  Primary   sending antibiotics for patient.  She has been sick for several weeks.  Also sending cough meds.   Relevant Orders   POCT XPERT XPRESS SARS COVID-2/FLU/RSV (Completed)       Return in 1 month (on 11/02/2023) for F/U.   Total time spent: 20 minutes  Miki Kins, FNP  10/02/2023   This document may have been prepared by [facility] Voice Recognition software and as such may include  unintentional dictation errors.

## 2023-12-28 NOTE — Assessment & Plan Note (Signed)
 Patient stable.  Well controlled with current therapy.   Continue current meds.

## 2023-12-31 ENCOUNTER — Other Ambulatory Visit: Payer: Self-pay | Admitting: Family

## 2024-01-01 ENCOUNTER — Encounter: Payer: Self-pay | Admitting: Cardiovascular Disease

## 2024-01-03 ENCOUNTER — Ambulatory Visit (INDEPENDENT_AMBULATORY_CARE_PROVIDER_SITE_OTHER): Admitting: Family

## 2024-01-03 ENCOUNTER — Encounter: Payer: Self-pay | Admitting: Family

## 2024-01-03 ENCOUNTER — Other Ambulatory Visit: Payer: Self-pay | Admitting: Cardiology

## 2024-01-03 VITALS — BP 128/72 | HR 89 | Temp 97.1°F | Ht 65.0 in | Wt 138.0 lb

## 2024-01-03 DIAGNOSIS — F900 Attention-deficit hyperactivity disorder, predominantly inattentive type: Secondary | ICD-10-CM | POA: Diagnosis not present

## 2024-01-03 DIAGNOSIS — J069 Acute upper respiratory infection, unspecified: Secondary | ICD-10-CM | POA: Diagnosis not present

## 2024-01-03 DIAGNOSIS — M5442 Lumbago with sciatica, left side: Secondary | ICD-10-CM | POA: Diagnosis not present

## 2024-01-03 DIAGNOSIS — M797 Fibromyalgia: Secondary | ICD-10-CM | POA: Diagnosis not present

## 2024-01-03 DIAGNOSIS — G4719 Other hypersomnia: Secondary | ICD-10-CM

## 2024-01-03 DIAGNOSIS — J399 Disease of upper respiratory tract, unspecified: Secondary | ICD-10-CM

## 2024-01-03 DIAGNOSIS — M5441 Lumbago with sciatica, right side: Secondary | ICD-10-CM

## 2024-01-03 DIAGNOSIS — F331 Major depressive disorder, recurrent, moderate: Secondary | ICD-10-CM

## 2024-01-03 DIAGNOSIS — G8929 Other chronic pain: Secondary | ICD-10-CM

## 2024-01-03 LAB — POCT XPERT XPRESS SARS COVID-2/FLU/RSV
FLU A: NEGATIVE
FLU B: NEGATIVE
RSV RNA, PCR: NEGATIVE
SARS Coronavirus 2: NEGATIVE

## 2024-01-03 MED ORDER — FEXOFENADINE HCL 180 MG PO TABS: 180.0000 mg | ORAL_TABLET | Freq: Every day | ORAL | 1 refills | Status: DC

## 2024-01-03 MED ORDER — AMPHETAMINE-DEXTROAMPHETAMINE 20 MG PO TABS: 20.0000 mg | ORAL_TABLET | Freq: Two times a day (BID) | ORAL | 0 refills | Status: DC

## 2024-01-03 MED ORDER — HYDROCODONE-ACETAMINOPHEN 10-325 MG PO TABS: 1.0000 | ORAL_TABLET | Freq: Three times a day (TID) | ORAL | 0 refills | Status: DC | PRN

## 2024-01-03 NOTE — Progress Notes (Signed)
 Opened in error

## 2024-01-08 ENCOUNTER — Ambulatory Visit (INDEPENDENT_AMBULATORY_CARE_PROVIDER_SITE_OTHER): Payer: Commercial Managed Care - PPO

## 2024-01-08 DIAGNOSIS — R0789 Other chest pain: Secondary | ICD-10-CM

## 2024-01-08 DIAGNOSIS — R002 Palpitations: Secondary | ICD-10-CM

## 2024-01-08 DIAGNOSIS — R55 Syncope and collapse: Secondary | ICD-10-CM | POA: Diagnosis not present

## 2024-01-08 DIAGNOSIS — R0602 Shortness of breath: Secondary | ICD-10-CM | POA: Diagnosis not present

## 2024-01-08 DIAGNOSIS — R001 Bradycardia, unspecified: Secondary | ICD-10-CM

## 2024-01-08 DIAGNOSIS — R42 Dizziness and giddiness: Secondary | ICD-10-CM

## 2024-01-08 MED ORDER — TECHNETIUM TC 99M SESTAMIBI GENERIC - CARDIOLITE
30.8000 | Freq: Once | INTRAVENOUS | Status: AC | PRN
  Administered 2024-01-08: 30.8 via INTRAVENOUS

## 2024-01-08 MED ORDER — TECHNETIUM TC 99M SESTAMIBI GENERIC - CARDIOLITE
10.2600 | Freq: Once | INTRAVENOUS | Status: AC | PRN
  Administered 2024-01-08: 10.26 via INTRAVENOUS

## 2024-01-10 DIAGNOSIS — R079 Chest pain, unspecified: Secondary | ICD-10-CM | POA: Diagnosis not present

## 2024-01-11 ENCOUNTER — Other Ambulatory Visit: Payer: Self-pay | Admitting: Family

## 2024-01-11 DIAGNOSIS — R531 Weakness: Secondary | ICD-10-CM

## 2024-01-11 DIAGNOSIS — R202 Paresthesia of skin: Secondary | ICD-10-CM

## 2024-01-11 DIAGNOSIS — R768 Other specified abnormal immunological findings in serum: Secondary | ICD-10-CM

## 2024-01-18 ENCOUNTER — Ambulatory Visit (INDEPENDENT_AMBULATORY_CARE_PROVIDER_SITE_OTHER): Payer: Commercial Managed Care - PPO | Admitting: Cardiovascular Disease

## 2024-01-18 ENCOUNTER — Encounter: Payer: Self-pay | Admitting: Cardiovascular Disease

## 2024-01-18 VITALS — BP 128/78 | HR 94 | Ht 65.0 in | Wt 136.0 lb

## 2024-01-18 DIAGNOSIS — R002 Palpitations: Secondary | ICD-10-CM

## 2024-01-18 DIAGNOSIS — R42 Dizziness and giddiness: Secondary | ICD-10-CM | POA: Diagnosis not present

## 2024-01-18 DIAGNOSIS — I471 Supraventricular tachycardia, unspecified: Secondary | ICD-10-CM | POA: Diagnosis not present

## 2024-01-18 DIAGNOSIS — Z0130 Encounter for examination of blood pressure without abnormal findings: Secondary | ICD-10-CM

## 2024-01-18 DIAGNOSIS — R55 Syncope and collapse: Secondary | ICD-10-CM

## 2024-01-18 MED ORDER — METOPROLOL SUCCINATE ER 25 MG PO TB24: 12.5000 mg | ORAL_TABLET | Freq: Every day | ORAL | 2 refills | Status: DC

## 2024-01-18 MED ORDER — FLECAINIDE ACETATE 50 MG PO TABS: 50.0000 mg | ORAL_TABLET | Freq: Two times a day (BID) | ORAL | 11 refills | Status: DC

## 2024-01-18 NOTE — Progress Notes (Signed)
 Cardiology Office Note   Date:  01/18/2024   ID:  Margueritte Guthridge, DOB 07-24-1976, MRN [id number]  PCP:  Miki Kins, FNP  Cardiologist:  Adrian Blackwater, MD      History of Present Illness: Sandra Aguilar is a 48 y.o. female who presents for  Chief Complaint  Patient presents with   Follow-up    5 Weeks ECHO/ NST/ Holter Results    Had ho;ter and episodes of chest palpitation and lightheadedness.      Past Medical History:  Diagnosis Date   Cough 07/16/2020   Sandra Aguilar presents via virtual visit with complaints of cough with dark yellow and green mucous, nasal congestion and stuffiness, low grade fevers, sore throat, nausea, vomiting, and recent sick exposure. She had a recent negative covid test last week, and started Augmentin for a URI after her negative Covid results.  She states she started feeling a little better then started feeling bad again.     Depression    Family history of breast cancer 05/27/2018   Fibromyalgia    Insomnia    Lung nodule    Lung nodule, solitary 09/04/2020   PTSD (post-traumatic stress disorder)    Strep pharyngitis 02/17/2021   Last Assessment & Plan: Formatting of this note might be different from the original. Will not check a rapid strep test for her sore throat due to her husband was positive for strep throat diagnosed yesterday in the same clinic.  Will prescribe Pen-Vee K 500 mg 1 every 12 hours for 10 days.  Be sure to drink plenty of fluids including Gatorade, take Tylenol or Advil for pain or fever as needed.  I   Stroke [facility])    Suspected COVID-19 virus infection 06/29/2020   Formatting of this note might be different from the original. Sandra Aguilar presents via virtual visit with complaints of cough with dark yellow and green mucous, nasal congestion and stuffiness, low grade fevers, sore throat, nausea, vomiting, and recent sick exposure. She had a recent negative covid test last week, and started Augmentin for a URI after her negative  Covid results.  She states she start   Thyroid nodule      Past Surgical History:  Procedure Laterality Date   APPENDECTOMY     CHOLECYSTECTOMY       Current Outpatient Medications  Medication Sig Dispense Refill   flecainide (TAMBOCOR) 50 MG tablet Take 1 tablet (50 mg total) by mouth 2 (two) times daily. 60 tablet 11   metoprolol succinate (TOPROL XL) 25 MG 24 hr tablet Take 0.5 tablets (12.5 mg total) by mouth daily. 15 tablet 2   albuterol (VENTOLIN HFA) 108 (90 Base) MCG/ACT inhaler Inhale 1-2 puffs into the lungs every 6 (six) hours as needed for wheezing or shortness of breath. 18 each 3   amphetamine-dextroamphetamine (ADDERALL) 20 MG tablet Take 1 tablet (20 mg total) by mouth 2 (two) times daily. 60 tablet 0   brompheniramine-pseudoephedrine-DM 30-2-10 MG/5ML syrup Take 5 mLs by mouth 4 (four) times daily as needed. (Patient not taking: Reported on 12/04/2023) 120 mL 0   fexofenadine (ALLEGRA) 180 MG tablet Take 1 tablet (180 mg total) by mouth daily. 90 tablet 1   fluticasone (FLONASE) 50 MCG/ACT nasal spray Place 2 sprays into both nostrils daily. 16 g 0   gabapentin (NEURONTIN) 600 MG tablet Take 1 tablet (600 mg total) by mouth 2 (two) times daily. 180 tablet 0   HYDROcodone-acetaminophen (NORCO) 10-325 MG tablet Take 1 tablet by mouth  every 8 (eight) hours as needed for severe pain (pain score 7-10). 90 tablet 0   hydrOXYzine (ATARAX) 25 MG tablet TAKE 1 TABLET (25 MG TOTAL) BY MOUTH 3 TIMES A DAY AS NEEDED FOR ITCHING OR ANXIETY 270 tablet 1   hyoscyamine (LEVSIN SL) 0.125 MG SL tablet Place 1 tablet (0.125 mg total) under the tongue every 4 (four) hours as needed. 30 tablet 0   LORazepam (ATIVAN) 2 MG tablet Take 1 tablet (2 mg total) by mouth every 8 (eight) hours as needed for anxiety. 90 tablet 2   ondansetron (ZOFRAN-ODT) 8 MG disintegrating tablet Take 1 tablet (8 mg total) by mouth every 8 (eight) hours as needed for nausea or vomiting. 20 tablet 0   tiZANidine  (ZANAFLEX) 4 MG tablet Take 1 tablet (4 mg total) by mouth 3 (three) times daily. 90 tablet 3   trazodone (DESYREL) 300 MG tablet TAKE 1 TABLET BY MOUTH EVERYDAY AT BEDTIME 90 tablet 0   No current facility-administered medications for this visit.    Allergies:   Ciprofloxacin, Nitrofurantoin, Promethazine, and Sulfa antibiotics    Social History:   reports that she has never smoked. She has never been exposed to tobacco smoke. She has never used smokeless tobacco. She reports that she does not currently use alcohol. She reports that she does not use drugs.   Family History:  family history includes Heart attack in her father and maternal grandmother; Obesity in her paternal aunt; Ovarian cancer in her maternal grandmother; Stroke in her paternal grandmother; Vascular Disease in her father, maternal grandfather, maternal uncle, and paternal aunt.    ROS:     Review of Systems  Constitutional: Negative.   HENT: Negative.    Eyes: Negative.   Respiratory: Negative.    Gastrointestinal: Negative.   Genitourinary: Negative.   Musculoskeletal: Negative.   Skin: Negative.   Neurological: Negative.   Endo/Heme/Allergies: Negative.   Psychiatric/Behavioral: Negative.    All other systems reviewed and are negative.     All other systems are reviewed and negative.    PHYSICAL EXAM: VS:  BP 128/78   Pulse 94   Ht 5\' 5"  (1.651 m)   Wt 136 lb (61.7 kg)   SpO2 97%   BMI 22.63 kg/m  , BMI Body mass index is 22.63 kg/m. Last weight:  Wt Readings from Last 3 Encounters:  01/18/24 136 lb (61.7 kg)  01/03/24 138 lb (62.6 kg)  12/14/23 139 lb (63 kg)     Physical Exam Constitutional:      Appearance: Normal appearance.  Cardiovascular:     Rate and Rhythm: Normal rate and regular rhythm.     Heart sounds: Normal heart sounds.  Pulmonary:     Effort: Pulmonary effort is normal.     Breath sounds: Normal breath sounds.  Musculoskeletal:     Right lower leg: No edema.     Left  lower leg: No edema.  Neurological:     Mental Status: She is alert.       EKG:   Recent Labs: 12/04/2023: ALT 14; BUN 10; Creatinine, Ser 0.71; Hemoglobin 12.2; Platelets 219; Potassium 4.3; Sodium 137; TSH 1.030    Lipid Panel No results found for: "CHOL", "TRIG", "HDL", "CHOLHDL", "VLDL", "LDLCALC", "LDLDIRECT"    Other studies Reviewed: Additional studies/ records that were reviewed today include:  Review of the above records demonstrates:       No data to display  ASSESSMENT AND PLAN:    ICD-10-CM   1. Syncope, unspecified syncope type  R55 flecainide (TAMBOCOR) 50 MG tablet    metoprolol succinate (TOPROL XL) 25 MG 24 hr tablet   stress test and echo unremarkable, SVT and sinus tachycardia, try flecanide and metoprolol    2. Palpitations  R00.2 flecainide (TAMBOCOR) 50 MG tablet    metoprolol succinate (TOPROL XL) 25 MG 24 hr tablet    3. Dizziness  R42 flecainide (TAMBOCOR) 50 MG tablet    metoprolol succinate (TOPROL XL) 25 MG 24 hr tablet    4. SVT (supraventricular tachycardia) (HCC)  I47.10 flecainide (TAMBOCOR) 50 MG tablet    metoprolol succinate (TOPROL XL) 25 MG 24 hr tablet   start flecanide as normal LVEF trace MR/TR, UNCHANGED FROM 2021.       Problem List Items Addressed This Visit       Other   Palpitations   Relevant Medications   flecainide (TAMBOCOR) 50 MG tablet   metoprolol succinate (TOPROL XL) 25 MG 24 hr tablet   Other Visit Diagnoses       Syncope, unspecified syncope type    -  Primary   stress test and echo unremarkable, SVT and sinus tachycardia, try flecanide and metoprolol   Relevant Medications   flecainide (TAMBOCOR) 50 MG tablet   metoprolol succinate (TOPROL XL) 25 MG 24 hr tablet     Dizziness       Relevant Medications   flecainide (TAMBOCOR) 50 MG tablet   metoprolol succinate (TOPROL XL) 25 MG 24 hr tablet     SVT (supraventricular tachycardia) (HCC)       start flecanide as normal LVEF trace  MR/TR, UNCHANGED FROM 2021.   Relevant Medications   flecainide (TAMBOCOR) 50 MG tablet   metoprolol succinate (TOPROL XL) 25 MG 24 hr tablet          Disposition:   Return in about 3 months (around 04/19/2024).    Total time spent: 30 minutes  Signed,  Adrian Blackwater, MD  01/18/2024 2:36 PM    [facility]

## 2024-01-27 ENCOUNTER — Encounter: Payer: Self-pay | Admitting: Family

## 2024-01-27 NOTE — Progress Notes (Signed)
 Established Patient Office Visit  Subjective:  Patient ID: Sandra Aguilar, female    DOB: [birth year]  Age: 48 y.o. MRN: [id number]  Chief Complaint  Patient presents with   Cough    Cough congestion- started lastnight    Patient is here today for her 1 month follow up.  She has been feeling poorly since last appointment.   She does have additional concerns to discuss today.  Labs are not due today. She needs refills.   I have reviewed her active problem list, medication list, allergies, notes from last encounter, lab results for her appointment today.      No other concerns at this time.   Past Medical History:  Diagnosis Date   Cough 07/16/2020   Paticia presents via virtual visit with complaints of cough with dark yellow and green mucous, nasal congestion and stuffiness, low grade fevers, sore throat, nausea, vomiting, and recent sick exposure. She had a recent negative covid test last week, and started Augmentin for a URI after her negative Covid results.  She states she started feeling a little better then started feeling bad again.     Depression    Family history of breast cancer 05/27/2018   Fibromyalgia    Insomnia    Lung nodule    Lung nodule, solitary 09/04/2020   PTSD (post-traumatic stress disorder)    Strep pharyngitis 02/17/2021   Last Assessment & Plan: Formatting of this note might be different from the original. Will not check a rapid strep test for her sore throat due to her husband was positive for strep throat diagnosed yesterday in the same clinic.  Will prescribe Pen-Vee K 500 mg 1 every 12 hours for 10 days.  Be sure to drink plenty of fluids including Gatorade, take Tylenol or Advil for pain or fever as needed.  I   Stroke [facility])    Suspected COVID-19 virus infection 06/29/2020   Formatting of this note might be different from the original. Donnamarie presents via virtual visit with complaints of cough with dark yellow and green mucous, nasal congestion  and stuffiness, low grade fevers, sore throat, nausea, vomiting, and recent sick exposure. She had a recent negative covid test last week, and started Augmentin for a URI after her negative Covid results.  She states she start   Thyroid nodule     Past Surgical History:  Procedure Laterality Date   APPENDECTOMY     CHOLECYSTECTOMY      Social History   Socioeconomic History   Marital status: Married    Spouse name: Not on file   Number of children: Not on file   Years of education: Not on file   Highest education level: Not on file  Occupational History   Not on file  Tobacco Use   Smoking status: Never    Passive exposure: Never   Smokeless tobacco: Never  Vaping Use   Vaping status: Never Used  Substance and Sexual Activity   Alcohol use: Not Currently    Comment: ocassionally   Drug use: Never   Sexual activity: Yes    Birth control/protection: None, Other-see comments    Comment: husband has vasectomy  Other Topics Concern   Not on file  Social History Narrative   Not on file   Social Drivers of Health   Financial Resource Strain: Not on file  Food Insecurity: Not on file  Transportation Needs: Not on file  Physical Activity: Not on file  Stress: Not on file  Social Connections: Unknown (05/08/2020)   Received from [facility], [facility]   Social Connections    Frequency of Communication with Friends and Family: Not asked    Frequency of Social Gatherings with Friends and Family: Not asked  Intimate Partner Violence: Unknown (05/08/2020)   Received from [facility], [facility]   Intimate Partner Violence    Fear of Current or Ex-Partner: Not asked    Emotionally Abused: Not asked    Physically Abused: Not asked    Sexually Abused: Not asked    Family History  Problem Relation Age of Onset   Heart attack Father    Vascular Disease Father    Vascular Disease Maternal Uncle    Obesity Paternal Aunt    Vascular Disease Paternal Aunt    Heart  attack Maternal Grandmother    Ovarian cancer Maternal Grandmother    Vascular Disease Maternal Grandfather    Stroke Paternal Grandmother     Allergies  Allergen Reactions   Ciprofloxacin Nausea And Vomiting and Other (See Comments)    Neuropathy  Neuropathy  Neuropathy  Neuropathy  Neuropathy   Nitrofurantoin Hives    Other reaction(s): Hives/Swelling-Allergy   Promethazine Other (See Comments)    "Childhood," pt stated she has tolerated since   Sulfa Antibiotics Nausea And Vomiting, Other (See Comments) and Rash    CAUSES PAIN, PT REPORTS    Review of Systems  Constitutional:  Positive for chills and malaise/fatigue.  Respiratory:  Positive for cough.   Musculoskeletal:  Positive for joint pain.  All other systems reviewed and are negative.      Objective:   BP 128/72   Pulse 89   Temp (!) 97.1 F (36.2 C)   Ht 5\' 5"  (1.651 m)   Wt 138 lb (62.6 kg)   SpO2 97%   BMI 22.96 kg/m   Vitals:   01/03/24 1432  BP: 128/72  Pulse: 89  Temp: (!) 97.1 F (36.2 C)  Height: 5\' 5"  (1.651 m)  Weight: 138 lb (62.6 kg)  SpO2: 97%  BMI (Calculated): 22.96    Physical Exam Vitals and nursing note reviewed.  Constitutional:      General: She is awake.     Appearance: Normal appearance. She is normal weight.  HENT:     Head: Normocephalic.  Eyes:     Extraocular Movements: Extraocular movements intact.     Conjunctiva/sclera: Conjunctivae normal.     Pupils: Pupils are equal, round, and reactive to light.  Cardiovascular:     Rate and Rhythm: Normal rate.  Pulmonary:     Effort: Pulmonary effort is normal.  Neurological:     General: No focal deficit present.     Mental Status: She is alert and oriented to person, place, and time. Mental status is at baseline.  Psychiatric:        Mood and Affect: Mood normal.        Behavior: Behavior is cooperative.        Thought Content: Thought content normal.        Judgment: Judgment normal.      Results for  orders placed or performed in visit on 01/03/24  POCT XPERT XPRESS SARS COVID-2/FLU/RSV  Result Value Ref Range   SARS Coronavirus 2 negative    FLU A negative    FLU B negative    RSV RNA, PCR negative     Recent Results (from the past 2160 hours)  Basic metabolic panel     Status:  Abnormal   Collection Time: 11/22/23  5:53 PM  Result Value Ref Range   Sodium 136 135 - 145 mmol/L   Potassium 4.5 3.5 - 5.1 mmol/L   Chloride 100 98 - 111 mmol/L   CO2 24 22 - 32 mmol/L   Glucose, Bld 212 (H) 70 - 99 mg/dL    Comment: Glucose reference range applies only to samples taken after fasting for at least 8 hours.   BUN 10 6 - 20 mg/dL   Creatinine, Ser 5.78 0.44 - 1.00 mg/dL   Calcium 9.1 8.9 - 46.9 mg/dL   GFR, Estimated >62 >95 mL/min    Comment: (NOTE) Calculated using the CKD-EPI Creatinine Equation (2021)    Anion gap 12 5 - 15    Comment: Performed at [facility], [address] [place], [place] [address]  CBC     Status: None   Collection Time: 11/22/23  5:53 PM  Result Value Ref Range   WBC 8.7 4.0 - 10.5 K/uL   RBC 4.00 3.87 - 5.11 MIL/uL   Hemoglobin 12.5 12.0 - 15.0 g/dL   HCT 24.4 01.0 - 27.2 %   MCV 92.5 80.0 - 100.0 fL   MCH 31.3 26.0 - 34.0 pg   MCHC 33.8 30.0 - 36.0 g/dL   RDW 53.6 64.4 - 03.4 %   Platelets 236 150 - 400 K/uL   nRBC 0.0 0.0 - 0.2 %    Comment: Performed at [facility], [address] [place], [place] [address]  Troponin I (High Sensitivity)     Status: None   Collection Time: 11/22/23  5:53 PM  Result Value Ref Range   Troponin I (High Sensitivity) 3 <18 ng/L    Comment: (NOTE) Elevated high sensitivity troponin I (hsTnI) values and significant  changes across serial measurements may suggest ACS but many other  chronic and acute conditions are known to elevate hsTnI results.  Refer to the "Links" section for chest pain algorithms and additional  guidance. Performed at [facility], [address]  [place], [place] [address]   POC urine preg, ED     Status: None   Collection Time: 11/22/23  6:01 PM  Result Value Ref Range   Preg Test, Ur NEGATIVE NEGATIVE    Comment:        THE SENSITIVITY OF THIS METHODOLOGY IS >24 mIU/mL   POCT XPERT XPRESS SARS COVID-2/FLU/RSV     Status: None   Collection Time: 12/04/23  3:52 PM  Result Value Ref Range   SARS Coronavirus 2 neg    FLU A neg    FLU B neg    RSV RNA, PCR neg   CBC with Diff     Status: None   Collection Time: 12/04/23  3:53 PM  Result Value Ref Range   WBC 7.4 3.4 - 10.8 x10E3/uL   RBC 3.93 3.77 - 5.28 x10E6/uL   Hemoglobin 12.2 11.1 - 15.9 g/dL   Hematocrit 56.4 33.2 - 46.6 %   MCV 94 79 - 97 fL   MCH 31.0 26.6 - 33.0 pg   MCHC 32.9 31.5 - 35.7 g/dL   RDW 95.1 88.4 - 16.6 %   Platelets 219 150 - 450 x10E3/uL   Neutrophils 54 Not Estab. %   Lymphs 34 Not Estab. %   Monocytes 8 Not Estab. %   Eos 3 Not Estab. %   Basos 1 Not Estab. %   Neutrophils Absolute 4.0 1.4 - 7.0 x10E3/uL  Lymphocytes Absolute 2.6 0.7 - 3.1 x10E3/uL   Monocytes Absolute 0.6 0.1 - 0.9 x10E3/uL   EOS (ABSOLUTE) 0.2 0.0 - 0.4 x10E3/uL   Basophils Absolute 0.0 0.0 - 0.2 x10E3/uL   Immature Granulocytes 0 Not Estab. %   Immature Grans (Abs) 0.0 0.0 - 0.1 x10E3/uL  CMP14+EGFR     Status: None   Collection Time: 12/04/23  3:53 PM  Result Value Ref Range   Glucose 80 70 - 99 mg/dL   BUN 10 6 - 24 mg/dL   Creatinine, Ser 8.29 0.57 - 1.00 mg/dL   eGFR 562 >13 YQ/MVH/8.46   BUN/Creatinine Ratio 14 9 - 23   Sodium 137 134 - 144 mmol/L   Potassium 4.3 3.5 - 5.2 mmol/L   Chloride 100 96 - 106 mmol/L   CO2 23 20 - 29 mmol/L   Calcium 10.2 8.7 - 10.2 mg/dL   Total Protein 6.9 6.0 - 8.5 g/dL   Albumin 4.4 3.9 - 4.9 g/dL   Globulin, Total 2.5 1.5 - 4.5 g/dL   Bilirubin Total 0.3 0.0 - 1.2 mg/dL   Alkaline Phosphatase 49 44 - 121 IU/L   AST 16 0 - 40 IU/L   ALT 14 0 - 32 IU/L  TSH+T4F+T3Free     Status: None   Collection Time: 12/04/23  3:53 PM   Result Value Ref Range   TSH 1.030 0.450 - 4.500 uIU/mL   T3, Free 3.1 2.0 - 4.4 pg/mL   Free T4 1.05 0.82 - 1.77 ng/dL  Vitamin N62     Status: None   Collection Time: 12/04/23  3:53 PM  Result Value Ref Range   Vitamin B-12 509 232 - 1,245 pg/mL  RBC Folate     Status: None   Collection Time: 12/04/23  3:53 PM  Result Value Ref Range   Folate, Hemolysate 327.0 Not Estab. ng/mL   Folate, RBC 881 >498 ng/mL  Iron, TIBC and Ferritin Panel     Status: None   Collection Time: 12/04/23  3:53 PM  Result Value Ref Range   Total Iron Binding Capacity 335 250 - 450 ug/dL   UIBC 952 841 - 324 ug/dL   Iron 79 27 - 401 ug/dL   Iron Saturation 24 15 - 55 %   Ferritin 21 15 - 150 ng/mL  POCT XPERT XPRESS SARS COVID-2/FLU/RSV     Status: None   Collection Time: 12/04/23  4:06 PM  Result Value Ref Range   SARS Coronavirus 2 neg    FLU A neg    FLU B neg    RSV RNA, PCR neg   POCT XPERT XPRESS SARS COVID-2/FLU/RSV     Status: None   Collection Time: 01/03/24  3:47 PM  Result Value Ref Range   SARS Coronavirus 2 negative    FLU A negative    FLU B negative    RSV RNA, PCR negative        Assessment & Plan:   Problem List Items Addressed This Visit       Nervous and Auditory   Chronic bilateral low back pain with bilateral sciatica   Patient stable.  Well controlled with current therapy.   Continue current meds.        Relevant Medications   amphetamine-dextroamphetamine (ADDERALL) 20 MG tablet   HYDROcodone-acetaminophen (NORCO) 10-325 MG tablet   Other Relevant Orders   Ambulatory referral to Sleep Studies     Other   Moderate episode of recurrent major depressive disorder (HCC)  Patient stable.  Well controlled with current therapy.   Continue current meds.        Fibromyalgia   Patient stable.  Well controlled with current therapy.   Continue current meds.        Relevant Medications   HYDROcodone-acetaminophen (NORCO) 10-325 MG tablet   Attention  deficit hyperactivity disorder (ADHD), inattentive type, moderate - Primary   Patient stable.  Well controlled with current therapy.   Continue current meds.        Relevant Medications   amphetamine-dextroamphetamine (ADDERALL) 20 MG tablet   Other Relevant Orders   Ambulatory referral to Sleep Studies   Other Visit Diagnoses       Excessive daytime sleepiness       Will send patient for sleep study.     Upper respiratory tract infection, unspecified type       Relevant Orders   POCT XPERT XPRESS SARS COVID-2/FLU/RSV (Completed)       Return in about 1 month (around 02/03/2024) for F/U.   Total time spent: 20 minutes  Miki Kins, FNP  01/03/2024   This document may have been prepared by [facility] Voice Recognition software and as such may include unintentional dictation errors.

## 2024-01-27 NOTE — Assessment & Plan Note (Signed)
 Patient stable.  Well controlled with current therapy.   Continue current meds.

## 2024-01-30 ENCOUNTER — Ambulatory Visit: Admitting: Cardiovascular Disease

## 2024-02-02 ENCOUNTER — Ambulatory Visit: Admitting: Family

## 2024-02-03 NOTE — Assessment & Plan Note (Signed)
 Patient stable.  Well controlled with current therapy.   Continue current meds.

## 2024-02-03 NOTE — Assessment & Plan Note (Signed)
 Setting pt up for sleep study. Will await results for further steps.

## 2024-02-03 NOTE — Assessment & Plan Note (Addendum)
 Working to get MRI repeat given her previous history of this.  She will call to get this rescheduled.    Sending referral to Neuro.

## 2024-02-05 ENCOUNTER — Encounter: Payer: Self-pay | Admitting: Family

## 2024-02-05 ENCOUNTER — Ambulatory Visit: Admitting: Cardiovascular Disease

## 2024-02-05 ENCOUNTER — Ambulatory Visit (INDEPENDENT_AMBULATORY_CARE_PROVIDER_SITE_OTHER): Admitting: Family

## 2024-02-05 DIAGNOSIS — F900 Attention-deficit hyperactivity disorder, predominantly inattentive type: Secondary | ICD-10-CM | POA: Diagnosis not present

## 2024-02-05 DIAGNOSIS — M5441 Lumbago with sciatica, right side: Secondary | ICD-10-CM

## 2024-02-05 DIAGNOSIS — G479 Sleep disorder, unspecified: Secondary | ICD-10-CM

## 2024-02-05 DIAGNOSIS — M797 Fibromyalgia: Secondary | ICD-10-CM | POA: Diagnosis not present

## 2024-02-05 DIAGNOSIS — M5442 Lumbago with sciatica, left side: Secondary | ICD-10-CM | POA: Diagnosis not present

## 2024-02-05 DIAGNOSIS — Z0130 Encounter for examination of blood pressure without abnormal findings: Secondary | ICD-10-CM

## 2024-02-05 DIAGNOSIS — G8929 Other chronic pain: Secondary | ICD-10-CM

## 2024-02-05 MED ORDER — TRAZODONE HCL 300 MG PO TABS: 300.0000 mg | ORAL_TABLET | Freq: Every day | ORAL | 0 refills | Status: DC

## 2024-02-05 MED ORDER — ONDANSETRON 8 MG PO TBDP: 8.0000 mg | ORAL_TABLET | Freq: Three times a day (TID) | ORAL | 0 refills | Status: AC | PRN

## 2024-02-05 MED ORDER — TIZANIDINE HCL 4 MG PO TABS: 4.0000 mg | ORAL_TABLET | Freq: Three times a day (TID) | ORAL | 3 refills | Status: DC

## 2024-02-05 MED ORDER — HYDROCODONE-ACETAMINOPHEN 10-325 MG PO TABS: 1.0000 | ORAL_TABLET | Freq: Three times a day (TID) | ORAL | 0 refills | Status: DC | PRN

## 2024-02-05 MED ORDER — FLUCONAZOLE 150 MG PO TABS: 150.0000 mg | ORAL_TABLET | Freq: Every day | ORAL | 0 refills | Status: DC

## 2024-02-05 MED ORDER — AMPHETAMINE-DEXTROAMPHETAMINE 20 MG PO TABS: 20.0000 mg | ORAL_TABLET | Freq: Two times a day (BID) | ORAL | 0 refills | Status: DC

## 2024-02-05 MED ORDER — IBUPROFEN 800 MG PO TABS: 800.0000 mg | ORAL_TABLET | Freq: Three times a day (TID) | ORAL | 0 refills | Status: AC | PRN

## 2024-02-05 MED ORDER — AMOXICILLIN-POT CLAVULANATE 875-125 MG PO TABS: 1.0000 | ORAL_TABLET | Freq: Two times a day (BID) | ORAL | 0 refills | Status: DC

## 2024-02-05 NOTE — Progress Notes (Signed)
 Established Patient Office Visit  Subjective:  Patient ID: [name], female    DOB: Aug 30, 1976  Age: 48 y.o. MRN: [id number]  Chief Complaint  Patient presents with   Follow-up    Medication refills    Patient is here today for her 1 month follow up.  She has been feeling fairly well since last appointment.   She does not have additional concerns to discuss today.  She continues to have significant joint pain and MSK pain.   Labs are not due today. She needs refills.   I have reviewed her active problem list, medication list, allergies, notes from last encounter, lab results for her appointment today.      No other concerns at this time.   Past Medical History:  Diagnosis Date   Cough 07/16/2020   Sandra Aguilar presents via virtual visit with complaints of cough with dark yellow and green mucous, nasal congestion and stuffiness, low grade fevers, sore throat, nausea, vomiting, and recent sick exposure. She had a recent negative covid test last week, and started Augmentin  for a URI after her negative Covid results.  She states she started feeling a little better then started feeling bad again.     Depression    Family history of breast cancer 05/27/2018   Fibromyalgia    Insomnia    Lung nodule    Lung nodule, solitary 09/04/2020   PTSD (post-traumatic stress disorder)    Strep pharyngitis 02/17/2021   Last Assessment & Plan: Formatting of this note might be different from the original. Will not check a rapid strep test for her sore throat due to her husband was positive for strep throat diagnosed yesterday in the same clinic.  Will prescribe Pen-Vee K 500 mg 1 every 12 hours for 10 days.  Be sure to drink plenty of fluids including Gatorade, take Tylenol  or Advil  for pain or fever as needed.  I   Stroke [facility])    Suspected COVID-19 virus infection 06/29/2020   Formatting of this note might be different from the original. Sandra Aguilar presents via virtual visit with complaints  of cough with dark yellow and green mucous, nasal congestion and stuffiness, low grade fevers, sore throat, nausea, vomiting, and recent sick exposure. She had a recent negative covid test last week, and started Augmentin  for a URI after her negative Covid results.  She states she start   Thyroid  nodule     Past Surgical History:  Procedure Laterality Date   APPENDECTOMY     CHOLECYSTECTOMY     LEFT HEART CATH AND CORONARY ANGIOGRAPHY Left 02/20/2024   Procedure: LEFT HEART CATH AND CORONARY ANGIOGRAPHY;  Surgeon: Cherrie Cornwall, MD;  Location: [facility] INVASIVE CV LAB;  Service: Cardiovascular;  Laterality: Left;    Social History   Socioeconomic History   Marital status: Married    Spouse name: Not on file   Number of children: Not on file   Years of education: Not on file   Highest education level: Not on file  Occupational History   Not on file  Tobacco Use   Smoking status: Never    Passive exposure: Never   Smokeless tobacco: Never  Vaping Use   Vaping status: Never Used  Substance and Sexual Activity   Alcohol use: Not Currently    Comment: ocassionally   Drug use: Never   Sexual activity: Yes    Birth control/protection: None, Other-see comments    Comment: husband has vasectomy  Other Topics Concern  Not on file  Social History Narrative   Not on file   Social Drivers of Health   Financial Resource Strain: Not on file  Food Insecurity: Not on file  Transportation Needs: Not on file  Physical Activity: Not on file  Stress: Not on file  Social Connections: Unknown (05/08/2020)   Received from [facility], [facility]   Social Connections    Frequency of Communication with Friends and Family: Not asked    Frequency of Social Gatherings with Friends and Family: Not asked  Intimate Partner Violence: Unknown (05/08/2020)   Received from [facility], [facility]   Intimate Partner Violence    Fear of Current or Ex-Partner: Not asked    Emotionally Abused:  Not asked    Physically Abused: Not asked    Sexually Abused: Not asked    Family History  Problem Relation Age of Onset   Heart attack Father    Vascular Disease Father    Vascular Disease Maternal Uncle    Obesity Paternal Aunt    Vascular Disease Paternal Aunt    Heart attack Maternal Grandmother    Ovarian cancer Maternal Grandmother    Vascular Disease Maternal Grandfather    Stroke Paternal Grandmother     Allergies  Allergen Reactions   Ciprofloxacin Nausea And Vomiting and Other (See Comments)    Neuropathy  Neuropathy  Neuropathy  Neuropathy  Neuropathy   Nitrofurantoin Hives    Other reaction(s): Hives/Swelling-Allergy   Promethazine Other (See Comments)    "Childhood," pt stated she has tolerated since   Sulfa Antibiotics Nausea And Vomiting, Other (See Comments) and Rash    CAUSES PAIN, PT REPORTS    Review of Systems  Constitutional:  Positive for malaise/fatigue.  Musculoskeletal:  Positive for joint pain and myalgias.  Neurological:  Positive for dizziness and weakness.  All other systems reviewed and are negative.      Objective:   BP 102/62   Pulse 67   Ht 5\' 5"  (1.651 m)   Wt 141 lb 12.8 oz (64.3 kg)   SpO2 97%   BMI 23.60 kg/m   Vitals:   02/05/24 1405  BP: 102/62  Pulse: 67  Height: 5\' 5"  (1.651 m)  Weight: 141 lb 12.8 oz (64.3 kg)  SpO2: 97%  BMI (Calculated): 23.6    Physical Exam Vitals and nursing note reviewed.  Constitutional:      General: She is not in acute distress.    Appearance: Normal appearance. She is normal weight. She is ill-appearing. She is not toxic-appearing.  HENT:     Head: Normocephalic.  Eyes:     Extraocular Movements: Extraocular movements intact.     Conjunctiva/sclera: Conjunctivae normal.     Pupils: Pupils are equal, round, and reactive to light.  Cardiovascular:     Rate and Rhythm: Normal rate.  Pulmonary:     Effort: Pulmonary effort is normal.  Musculoskeletal:        General:  Tenderness present.  Neurological:     General: No focal deficit present.     Mental Status: She is alert and oriented to person, place, and time. Mental status is at baseline.  Psychiatric:        Mood and Affect: Mood normal.        Behavior: Behavior normal.        Thought Content: Thought content normal.        Judgment: Judgment normal.      No results found for any visits  on 02/05/24.  Recent Results (from the past 2160 hours)  POCT XPERT XPRESS SARS COVID-2/FLU/RSV     Status: None   Collection Time: 01/03/24  3:47 PM  Result Value Ref Range   SARS Coronavirus 2 negative    FLU A negative    FLU B negative    RSV RNA, PCR negative   Basic metabolic panel with GFR     Status: None   Collection Time: 02/08/24  2:58 PM  Result Value Ref Range   Glucose 73 70 - 99 mg/dL   BUN 10 6 - 24 mg/dL   Creatinine, Ser 7.82 0.57 - 1.00 mg/dL   eGFR 91 >95 AO/ZHY/8.65   BUN/Creatinine Ratio 13 9 - 23   Sodium 137 134 - 144 mmol/L   Potassium 3.9 3.5 - 5.2 mmol/L   Chloride 100 96 - 106 mmol/L   CO2 24 20 - 29 mmol/L   Calcium 9.8 8.7 - 10.2 mg/dL  CBC with Differential/Platelet     Status: Abnormal   Collection Time: 02/08/24  2:58 PM  Result Value Ref Range   WBC 11.8 (H) 3.4 - 10.8 x10E3/uL   RBC 3.64 (L) 3.77 - 5.28 x10E6/uL   Hemoglobin 11.6 11.1 - 15.9 g/dL   Hematocrit 78.4 69.6 - 46.6 %   MCV 95 79 - 97 fL   MCH 31.9 26.6 - 33.0 pg   MCHC 33.4 31.5 - 35.7 g/dL   RDW 29.5 28.4 - 13.2 %   Platelets 232 150 - 450 x10E3/uL   Neutrophils 69 Not Estab. %   Lymphs 23 Not Estab. %   Monocytes 6 Not Estab. %   Eos 2 Not Estab. %   Basos 0 Not Estab. %   Neutrophils Absolute 8.1 (H) 1.4 - 7.0 x10E3/uL   Lymphocytes Absolute 2.7 0.7 - 3.1 x10E3/uL   Monocytes Absolute 0.7 0.1 - 0.9 x10E3/uL   EOS (ABSOLUTE) 0.2 0.0 - 0.4 x10E3/uL   Basophils Absolute 0.0 0.0 - 0.2 x10E3/uL   Immature Granulocytes 0 Not Estab. %   Immature Grans (Abs) 0.0 0.0 - 0.1 x10E3/uL  CBC      Status: Abnormal   Collection Time: 02/11/24  9:35 AM  Result Value Ref Range   WBC 8.1 4.0 - 10.5 K/uL   RBC 3.84 (L) 3.87 - 5.11 MIL/uL   Hemoglobin 12.1 12.0 - 15.0 g/dL   HCT 44.0 (L) 10.2 - 72.5 %   MCV 93.0 80.0 - 100.0 fL   MCH 31.5 26.0 - 34.0 pg   MCHC 33.9 30.0 - 36.0 g/dL   RDW 36.6 44.0 - 34.7 %   Platelets 240 150 - 400 K/uL   nRBC 0.0 0.0 - 0.2 %    Comment: Performed at [facility], [address] [place], [place] [address]  Troponin I (High Sensitivity)     Status: None   Collection Time: 02/11/24  9:35 AM  Result Value Ref Range   Troponin I (High Sensitivity) <2 <18 ng/L    Comment: (NOTE) Elevated high sensitivity troponin I (hsTnI) values and significant  changes across serial measurements may suggest ACS but many other  chronic and acute conditions are known to elevate hsTnI results.  Refer to the "Links" section for chest pain algorithms and additional  guidance. Performed at [facility], [address], [place], [place] [address]   Comprehensive metabolic panel with GFR     Status: Abnormal   Collection Time: 02/11/24  9:35 AM  Result Value  Ref Range   Sodium 135 135 - 145 mmol/L   Potassium 3.9 3.5 - 5.1 mmol/L   Chloride 101 98 - 111 mmol/L   CO2 24 22 - 32 mmol/L   Glucose, Bld 106 (H) 70 - 99 mg/dL    Comment: Glucose reference range applies only to samples taken after fasting for at least 8 hours.   BUN 11 6 - 20 mg/dL   Creatinine, Ser 1.61 0.44 - 1.00 mg/dL   Calcium 9.5 8.9 - 09.6 mg/dL   Total Protein 7.5 6.5 - 8.1 g/dL   Albumin 3.8 3.5 - 5.0 g/dL   AST 16 15 - 41 U/L   ALT 19 0 - 44 U/L   Alkaline Phosphatase 47 38 - 126 U/L   Total Bilirubin 0.5 0.0 - 1.2 mg/dL   GFR, Estimated >04 >54 mL/min    Comment: (NOTE) Calculated using the CKD-EPI Creatinine Equation (2021)    Anion gap 10 5 - 15    Comment: Performed at [facility], [address] [place], [place] [address]  Lipase, blood     Status:  None   Collection Time: 02/11/24  9:35 AM  Result Value Ref Range   Lipase 29 11 - 51 U/L    Comment: Performed at [facility], [address] [place], [place] [address]  D-dimer, quantitative     Status: Abnormal   Collection Time: 02/11/24  9:50 AM  Result Value Ref Range   D-Dimer, Quant 0.52 (H) 0.00 - 0.50 ug/mL-FEU    Comment: (NOTE) At the manufacturer cut-off value of 0.5 g/mL FEU, this assay has a negative predictive value of 95-100%.This assay is intended for use in conjunction with a clinical pretest probability (PTP) assessment model to exclude pulmonary embolism (PE) and deep venous thrombosis (DVT) in outpatients suspected of PE or DVT. Results should be correlated with clinical presentation. Performed at [facility], [address] [place], [place] [address]   TSH     Status: None   Collection Time: 02/11/24 10:00 AM  Result Value Ref Range   TSH 0.950 0.350 - 4.500 uIU/mL    Comment: Performed by a 3rd Generation assay with a functional sensitivity of <=0.01 uIU/mL. Performed at [facility], [address] [place], [place] [address]   T4, free     Status: None   Collection Time: 02/11/24 10:00 AM  Result Value Ref Range   Free T4 0.67 0.61 - 1.12 ng/dL    Comment: (NOTE) Biotin ingestion may interfere with free T4 tests. If the results are inconsistent with the TSH level, previous test results, or the clinical presentation, then consider biotin interference. If needed, order repeat testing after stopping biotin. Performed at [facility], [address] [place], [place] [address]   POC urine preg, ED     Status: None   Collection Time: 02/11/24 10:45 AM  Result Value Ref Range   Preg Test, Ur Negative Negative  Urinalysis, Routine w reflex microscopic -Urine, Clean Catch     Status: Abnormal   Collection Time: 02/11/24 12:00 PM  Result Value Ref Range   Color, Urine COLORLESS (A) YELLOW   APPearance CLEAR  (A) CLEAR   Specific Gravity, Urine 1.016 1.005 - 1.030   pH 7.0 5.0 - 8.0   Glucose, UA NEGATIVE NEGATIVE mg/dL   Hgb urine dipstick NEGATIVE NEGATIVE   Bilirubin Urine NEGATIVE NEGATIVE   Ketones, ur NEGATIVE NEGATIVE mg/dL   Protein, ur NEGATIVE NEGATIVE mg/dL  Nitrite NEGATIVE NEGATIVE   Leukocytes,Ua NEGATIVE NEGATIVE    Comment: Performed at [facility], [address] [place], [place] [address]  Troponin I (High Sensitivity)     Status: None   Collection Time: 02/11/24 12:00 PM  Result Value Ref Range   Troponin I (High Sensitivity) 3 <18 ng/L    Comment: (NOTE) Elevated high sensitivity troponin I (hsTnI) values and significant  changes across serial measurements may suggest ACS but many other  chronic and acute conditions are known to elevate hsTnI results.  Refer to the "Links" section for chest pain algorithms and additional  guidance. Performed at [facility], [address] [place], [place] [address]   Lipid panel     Status: Abnormal   Collection Time: 03/06/24  3:19 PM  Result Value Ref Range   Cholesterol, Total 191 100 - 199 mg/dL   Triglycerides 098 0 - 149 mg/dL   HDL 54 >11 mg/dL   VLDL Cholesterol Cal 24 5 - 40 mg/dL   LDL Chol Calc (NIH) 914 (H) 0 - 99 mg/dL   Chol/HDL Ratio 3.5 0.0 - 4.4 ratio    Comment:                                   T. Chol/HDL Ratio                                             Men  Women                               1/2 Avg.Risk  3.4    3.3                                   Avg.Risk  5.0    4.4                                2X Avg.Risk  9.6    7.1                                3X Avg.Risk 23.4   11.0   VITAMIN D  25 Hydroxy (Vit-D Deficiency, Fractures)     Status: None   Collection Time: 03/06/24  3:19 PM  Result Value Ref Range   Vit D, 25-Hydroxy 33.9 30.0 - 100.0 ng/mL    Comment: Vitamin D  deficiency has been defined by the Institute of Medicine and an Endocrine Society practice guideline as  a level of serum 25-OH vitamin D  less than 20 ng/mL (1,2). The Endocrine Society went on to further define vitamin D  insufficiency as a level between 21 and 29 ng/mL (2). 1. IOM (Institute of Medicine). 2010. Dietary reference    intakes for calcium and D. [place]: The    [employer]. 2. Holick MF, Binkley [place], Bischoff-Ferrari HA, et al.    Evaluation, treatment, and prevention of vitamin D     deficiency: an Endocrine Society clinical practice    guideline. JCEM. 2011 Jul; 96(7):1911-30.   CMP14+EGFR     Status:  None   Collection Time: 03/06/24  3:19 PM  Result Value Ref Range   Glucose 93 70 - 99 mg/dL   BUN 10 6 - 24 mg/dL   Creatinine, Ser 1.91 0.57 - 1.00 mg/dL   eGFR 478 >29 FA/OZH/0.86   BUN/Creatinine Ratio 14 9 - 23   Sodium 138 134 - 144 mmol/L   Potassium 4.2 3.5 - 5.2 mmol/L   Chloride 100 96 - 106 mmol/L   CO2 26 20 - 29 mmol/L   Calcium 9.8 8.7 - 10.2 mg/dL   Total Protein 6.8 6.0 - 8.5 g/dL   Albumin 4.0 3.9 - 4.9 g/dL   Globulin, Total 2.8 1.5 - 4.5 g/dL   Bilirubin Total <5.7 0.0 - 1.2 mg/dL   Alkaline Phosphatase 55 44 - 121 IU/L   AST 11 0 - 40 IU/L   ALT 10 0 - 32 IU/L  Hemoglobin A1c     Status: None   Collection Time: 03/06/24  3:19 PM  Result Value Ref Range   Hgb A1c MFr Bld 5.3 4.8 - 5.6 %    Comment:          Prediabetes: 5.7 - 6.4          Diabetes: >6.4          Glycemic control for adults with diabetes: <7.0    Est. average glucose Bld gHb Est-mCnc 105 mg/dL  Vitamin Q46     Status: None   Collection Time: 03/06/24  3:19 PM  Result Value Ref Range   Vitamin B-12 541 232 - 1,245 pg/mL  CBC with Diff     Status: Abnormal   Collection Time: 03/06/24  3:19 PM  Result Value Ref Range   WBC 7.7 3.4 - 10.8 x10E3/uL   RBC 3.73 (L) 3.77 - 5.28 x10E6/uL   Hemoglobin 12.0 11.1 - 15.9 g/dL   Hematocrit 96.2 95.2 - 46.6 %   MCV 97 79 - 97 fL   MCH 32.2 26.6 - 33.0 pg   MCHC 33.3 31.5 - 35.7 g/dL   RDW 84.1 32.4 - 40.1 %    Platelets 269 150 - 450 x10E3/uL   Neutrophils 53 Not Estab. %   Lymphs 33 Not Estab. %   Monocytes 7 Not Estab. %   Eos 6 Not Estab. %   Basos 1 Not Estab. %   Neutrophils Absolute 4.1 1.4 - 7.0 x10E3/uL   Lymphocytes Absolute 2.6 0.7 - 3.1 x10E3/uL   Monocytes Absolute 0.5 0.1 - 0.9 x10E3/uL   EOS (ABSOLUTE) 0.5 (H) 0.0 - 0.4 x10E3/uL   Basophils Absolute 0.1 0.0 - 0.2 x10E3/uL   Immature Granulocytes 0 Not Estab. %   Immature Grans (Abs) 0.0 0.0 - 0.1 x10E3/uL  TSH+T4F+T3Free     Status: None   Collection Time: 03/06/24  3:19 PM  Result Value Ref Range   TSH 0.925 0.450 - 4.500 uIU/mL   T3, Free 2.8 2.0 - 4.4 pg/mL   Free T4 1.06 0.82 - 1.77 ng/dL  Iron, TIBC and Ferritin Panel     Status: None   Collection Time: 03/06/24  3:19 PM  Result Value Ref Range   Total Iron Binding Capacity 329 250 - 450 ug/dL   UIBC 027 253 - 664 ug/dL   Iron 73 27 - 403 ug/dL   Iron Saturation 22 15 - 55 %   Ferritin 25 15 - 150 ng/mL  CA 125     Status: None   Collection Time: 03/06/24  3:19  PM  Result Value Ref Range   Cancer Antigen (CA) 125 28.4 0.0 - 38.1 U/mL    Comment: [employer] Electrochemiluminescence Immunoassay (ECLIA) Values obtained with different assay methods or kits cannot be used interchangeably.  Results cannot be interpreted as absolute evidence of the presence or absence of malignant disease.   CEA     Status: None   Collection Time: 03/06/24  3:19 PM  Result Value Ref Range   CEA 1.4 0.0 - 4.7 ng/mL    Comment:                              Nonsmokers          <3.9                              Smokers             <5.6 [employer] Electrochemiluminescence Immunoassay (ECLIA) Values obtained with different assay methods or kits cannot be used interchangeably.  Results cannot be interpreted as absolute evidence of the presence or absence of malignant disease.        Assessment & Plan:   Problem List Items Addressed This Visit       Nervous and  Auditory   Chronic bilateral low back pain with bilateral sciatica   Patient stable.  Well controlled with current therapy.   Continue current meds.        Relevant Medications   ibuprofen  (ADVIL ) 800 MG tablet   amphetamine -dextroamphetamine  (ADDERALL) 20 MG tablet   tiZANidine  (ZANAFLEX ) 4 MG tablet   trazodone  (DESYREL ) 300 MG tablet     Other   Fibromyalgia   Patient stable.  Well controlled with current therapy.   Continue current meds.        Relevant Medications   ibuprofen  (ADVIL ) 800 MG tablet   tiZANidine  (ZANAFLEX ) 4 MG tablet   trazodone  (DESYREL ) 300 MG tablet   Attention deficit hyperactivity disorder (ADHD), inattentive type, moderate   Patient stable.  Well controlled with current therapy.   Continue current meds.        Relevant Medications   amphetamine -dextroamphetamine  (ADDERALL) 20 MG tablet   Sleep disorder   Patient stable.  Well controlled with current therapy.   Continue current meds.        Relevant Medications   trazodone  (DESYREL ) 300 MG tablet    Return in about 1 month (around 03/06/2024) for F/U.   Total time spent: 20 minutes  Trenda Frisk, FNP  02/05/2024   This document may have been prepared by [facility] Voice Recognition software and as such may include unintentional dictation errors.

## 2024-02-08 ENCOUNTER — Encounter: Payer: Self-pay | Admitting: Cardiovascular Disease

## 2024-02-08 ENCOUNTER — Ambulatory Visit: Payer: Self-pay | Admitting: Cardiovascular Disease

## 2024-02-08 ENCOUNTER — Ambulatory Visit: Admitting: Cardiovascular Disease

## 2024-02-08 VITALS — BP 118/70 | HR 75 | Ht 65.0 in | Wt 145.0 lb

## 2024-02-08 DIAGNOSIS — I471 Supraventricular tachycardia, unspecified: Secondary | ICD-10-CM | POA: Diagnosis not present

## 2024-02-08 DIAGNOSIS — R55 Syncope and collapse: Secondary | ICD-10-CM

## 2024-02-08 DIAGNOSIS — Z0130 Encounter for examination of blood pressure without abnormal findings: Secondary | ICD-10-CM

## 2024-02-08 DIAGNOSIS — R002 Palpitations: Secondary | ICD-10-CM | POA: Diagnosis not present

## 2024-02-08 DIAGNOSIS — R42 Dizziness and giddiness: Secondary | ICD-10-CM | POA: Diagnosis not present

## 2024-02-08 DIAGNOSIS — I200 Unstable angina: Secondary | ICD-10-CM

## 2024-02-08 DIAGNOSIS — R0789 Other chest pain: Secondary | ICD-10-CM

## 2024-02-08 MED ORDER — FLECAINIDE ACETATE 50 MG PO TABS: 50.0000 mg | ORAL_TABLET | Freq: Two times a day (BID) | ORAL | 11 refills | Status: DC

## 2024-02-08 MED ORDER — SODIUM CHLORIDE 0.9% FLUSH
3.0000 mL | Freq: Two times a day (BID) | INTRAVENOUS | Status: DC
Start: 2024-02-08 — End: 2024-05-08

## 2024-02-08 NOTE — Progress Notes (Signed)
 Cardiology Office Note   Date:  02/08/2024   ID:  Sandra Aguilar, DOB Aug 09, 1976, MRN [id number]  PCP:  Miki Kins, FNP  Cardiologist:  Adrian Blackwater, MD      History of Present Illness: Sandra Aguilar is a 48 y.o. female who presents for  Chief Complaint  Patient presents with   Follow-up    74/34 BP this morning  02/02/24 BP 84/50    Has a cold, headaches, with low BP. Has been feeling weak and SOB.  Patient states that for the past 2 to 3 weeks been having persistent pressure type chest pain associated with shortness of breath and diaphoresis.  She felt like she was going to pass out.  She has a history of SVT which was treated with flecainide and palpitations are less frequent now.  She had a equivocal nuclear stress test with normal ejection fraction.  She continues to have chest pain thus will do cardiac catheterization.      Past Medical History:  Diagnosis Date   Cough 07/16/2020   Sandra Aguilar presents via virtual visit with complaints of cough with dark yellow and green mucous, nasal congestion and stuffiness, low grade fevers, sore throat, nausea, vomiting, and recent sick exposure. She had a recent negative covid test last week, and started Augmentin for a URI after her negative Covid results.  She states she started feeling a little better then started feeling bad again.     Depression    Family history of breast cancer 05/27/2018   Fibromyalgia    Insomnia    Lung nodule    Lung nodule, solitary 09/04/2020   PTSD (post-traumatic stress disorder)    Strep pharyngitis 02/17/2021   Last Assessment & Plan: Formatting of this note might be different from the original. Will not check a rapid strep test for her sore throat due to her husband was positive for strep throat diagnosed yesterday in the same clinic.  Will prescribe Pen-Vee K 500 mg 1 every 12 hours for 10 days.  Be sure to drink plenty of fluids including Gatorade, take Tylenol or Advil for pain or fever as  needed.  I   Stroke [facility])    Suspected COVID-19 virus infection 06/29/2020   Formatting of this note might be different from the original. [name] presents via virtual visit with complaints of cough with dark yellow and green mucous, nasal congestion and stuffiness, low grade fevers, sore throat, nausea, vomiting, and recent sick exposure. She had a recent negative covid test last week, and started Augmentin for a URI after her negative Covid results.  She states she start   Thyroid nodule      Past Surgical History:  Procedure Laterality Date   APPENDECTOMY     CHOLECYSTECTOMY       Current Outpatient Medications  Medication Sig Dispense Refill   albuterol (VENTOLIN HFA) 108 (90 Base) MCG/ACT inhaler Inhale 1-2 puffs into the lungs every 6 (six) hours as needed for wheezing or shortness of breath. 18 each 3   amoxicillin-clavulanate (AUGMENTIN) 875-125 MG tablet Take 1 tablet by mouth 2 (two) times daily. 20 tablet 0   amphetamine-dextroamphetamine (ADDERALL) 20 MG tablet Take 1 tablet (20 mg total) by mouth 2 (two) times daily. 60 tablet 0   fexofenadine (ALLEGRA) 180 MG tablet Take 1 tablet (180 mg total) by mouth daily. 90 tablet 1   flecainide (TAMBOCOR) 50 MG tablet Take 1 tablet (50 mg total) by mouth 2 (two) times daily. 60 tablet  11   fluconazole (DIFLUCAN) 150 MG tablet Take 1 tablet (150 mg total) by mouth daily. 5 tablet 0   fluticasone (FLONASE) 50 MCG/ACT nasal spray Place 2 sprays into both nostrils daily. 16 g 0   HYDROcodone-acetaminophen (NORCO) 10-325 MG tablet Take 1 tablet by mouth every 8 (eight) hours as needed for severe pain (pain score 7-10). 90 tablet 0   hydrOXYzine (ATARAX) 25 MG tablet TAKE 1 TABLET (25 MG TOTAL) BY MOUTH 3 TIMES A DAY AS NEEDED FOR ITCHING OR ANXIETY (Patient not taking: Reported on 02/05/2024) 270 tablet 1   hyoscyamine (LEVSIN SL) 0.125 MG SL tablet Place 1 tablet (0.125 mg total) under the tongue every 4 (four) hours as needed. 30 tablet 0    ibuprofen (ADVIL) 800 MG tablet Take 1 tablet (800 mg total) by mouth every 8 (eight) hours as needed. 30 tablet 0   LORazepam (ATIVAN) 2 MG tablet Take 1 tablet (2 mg total) by mouth every 8 (eight) hours as needed for anxiety. 90 tablet 2   ondansetron (ZOFRAN-ODT) 8 MG disintegrating tablet Take 1 tablet (8 mg total) by mouth every 8 (eight) hours as needed for nausea or vomiting. 20 tablet 0   tiZANidine (ZANAFLEX) 4 MG tablet Take 1 tablet (4 mg total) by mouth 3 (three) times daily. 90 tablet 3   trazodone (DESYREL) 300 MG tablet Take 1 tablet (300 mg total) by mouth at bedtime. 90 tablet 0   No current facility-administered medications for this visit.   Facility-Administered Medications Ordered in Other Visits  Medication Dose Route Frequency Provider Last Rate Last Admin   sodium chloride flush (NS) 0.9 % injection 3 mL  3 mL Intravenous Q12H Adrian Blackwater A, MD        Allergies:   Ciprofloxacin, Nitrofurantoin, Promethazine, and Sulfa antibiotics    Social History:   reports that she has never smoked. She has never been exposed to tobacco smoke. She has never used smokeless tobacco. She reports that she does not currently use alcohol. She reports that she does not use drugs.   Family History:  family history includes Heart attack in her father and maternal grandmother; Obesity in her paternal aunt; Ovarian cancer in her maternal grandmother; Stroke in her paternal grandmother; Vascular Disease in her father, maternal grandfather, maternal uncle, and paternal aunt.    ROS:     Review of Systems  Constitutional: Negative.   HENT: Negative.    Eyes: Negative.   Respiratory: Negative.    Gastrointestinal: Negative.   Genitourinary: Negative.   Musculoskeletal: Negative.   Skin: Negative.   Neurological: Negative.   Endo/Heme/Allergies: Negative.   Psychiatric/Behavioral: Negative.    All other systems reviewed and are negative.     All other systems are reviewed and  negative.    PHYSICAL EXAM: VS:  BP 118/70   Pulse 75   Ht 5\' 5"  (1.651 m)   Wt 145 lb (65.8 kg)   SpO2 98%   BMI 24.13 kg/m  , BMI Body mass index is 24.13 kg/m. Last weight:  Wt Readings from Last 3 Encounters:  02/08/24 145 lb (65.8 kg)  02/05/24 141 lb 12.8 oz (64.3 kg)  01/18/24 136 lb (61.7 kg)     Physical Exam Constitutional:      Appearance: Normal appearance.  Cardiovascular:     Rate and Rhythm: Normal rate and regular rhythm.     Heart sounds: Normal heart sounds.  Pulmonary:     Effort: Pulmonary effort is normal.  Breath sounds: Normal breath sounds.  Musculoskeletal:     Right lower leg: No edema.     Left lower leg: No edema.  Neurological:     Mental Status: She is alert.       EKG:   Recent Labs: 12/04/2023: ALT 14; BUN 10; Creatinine, Ser 0.71; Hemoglobin 12.2; Platelets 219; Potassium 4.3; Sodium 137; TSH 1.030    Lipid Panel No results found for: "CHOL", "TRIG", "HDL", "CHOLHDL", "VLDL", "LDLCALC", "LDLDIRECT"    Other studies Reviewed: Additional studies/ records that were reviewed today include:  Review of the above records demonstrates:       No data to display            ASSESSMENT AND PLAN:    ICD-10-CM   1. Palpitations  R00.2 flecainide (TAMBOCOR) 50 MG tablet    Basic metabolic panel with GFR    CBC with Differential/Platelet    Procedural/ Surgical Case Request: Left Heart Cath with possible coronary intervention    2. Syncope, unspecified syncope type  R55 flecainide (TAMBOCOR) 50 MG tablet    Basic metabolic panel with GFR    CBC with Differential/Platelet    Procedural/ Surgical Case Request: Left Heart Cath with possible coronary intervention   stress test and echo unremarkable, SVT and sinus tachycardia, try flecanide and metoprolol    3. Dizziness  R42 flecainide (TAMBOCOR) 50 MG tablet    Basic metabolic panel with GFR    CBC with Differential/Platelet    Procedural/ Surgical Case Request: Left Heart  Cath with possible coronary intervention    4. SVT (supraventricular tachycardia) (HCC)  I47.10 flecainide (TAMBOCOR) 50 MG tablet    Basic metabolic panel with GFR    CBC with Differential/Platelet    Procedural/ Surgical Case Request: Left Heart Cath with possible coronary intervention   start flecanide as normal LVEF trace MR/TR, UNCHANGED FROM 2021.    5. Other chest pain  R07.89 Basic metabolic panel with GFR    CBC with Differential/Platelet    Procedural/ Surgical Case Request: Left Heart Cath with possible coronary intervention   Continues to have chest pain with equivocal nuclear stress test.  Patient would like to have definite answer by doing left heart catheterization.    6. Unstable angina (HCC)  I20.0 Basic metabolic panel with GFR    CBC with Differential/Platelet    Procedural/ Surgical Case Request: Left Heart Cath with possible coronary intervention   Patient has unstable angina with chest pain at rest for the past 2 weeks associated with shortness of breath and presyncope.  She had a equivocal stress test.    7. SVT (supraventricular tachycardia) (HCC)  I47.10 flecainide (TAMBOCOR) 50 MG tablet    Basic metabolic panel with GFR    CBC with Differential/Platelet    Procedural/ Surgical Case Request: Left Heart Cath with possible coronary intervention       Problem List Items Addressed This Visit       Cardiovascular and Mediastinum   SVT (supraventricular tachycardia) (HCC)   Relevant Medications   flecainide (TAMBOCOR) 50 MG tablet   Other Relevant Orders   Basic metabolic panel with GFR   CBC with Differential/Platelet   Procedural/ Surgical Case Request: Left Heart Cath with possible coronary intervention   Unstable angina (HCC)   Relevant Medications   flecainide (TAMBOCOR) 50 MG tablet   Other Relevant Orders   Basic metabolic panel with GFR   CBC with Differential/Platelet   Procedural/ Surgical Case Request: Left Heart Cath with  possible coronary  intervention     Other   Palpitations - Primary   Relevant Medications   flecainide (TAMBOCOR) 50 MG tablet   Other Relevant Orders   Basic metabolic panel with GFR   CBC with Differential/Platelet   Procedural/ Surgical Case Request: Left Heart Cath with possible coronary intervention   Syncope   Relevant Medications   flecainide (TAMBOCOR) 50 MG tablet   Other Relevant Orders   Basic metabolic panel with GFR   CBC with Differential/Platelet   Procedural/ Surgical Case Request: Left Heart Cath with possible coronary intervention   Dizziness   Relevant Medications   flecainide (TAMBOCOR) 50 MG tablet   Other Relevant Orders   Basic metabolic panel with GFR   CBC with Differential/Platelet   Procedural/ Surgical Case Request: Left Heart Cath with possible coronary intervention   Other chest pain   Relevant Orders   Basic metabolic panel with GFR   CBC with Differential/Platelet   Procedural/ Surgical Case Request: Left Heart Cath with possible coronary intervention       Disposition:   Return in about 2 weeks (around 02/22/2024).    Total time spent: 45 minutes  Signed,  Adrian Blackwater, MD  02/08/2024 2:56 PM    [facility]

## 2024-02-09 LAB — CBC WITH DIFFERENTIAL/PLATELET
Basophils Absolute: 0 10*3/uL (ref 0.0–0.2)
Basos: 0 %
EOS (ABSOLUTE): 0.2 10*3/uL (ref 0.0–0.4)
Eos: 2 %
Hematocrit: 34.7 % (ref 34.0–46.6)
Hemoglobin: 11.6 g/dL (ref 11.1–15.9)
Immature Grans (Abs): 0 10*3/uL (ref 0.0–0.1)
Immature Granulocytes: 0 %
Lymphocytes Absolute: 2.7 10*3/uL (ref 0.7–3.1)
Lymphs: 23 %
MCH: 31.9 pg (ref 26.6–33.0)
MCHC: 33.4 g/dL (ref 31.5–35.7)
MCV: 95 fL (ref 79–97)
Monocytes Absolute: 0.7 10*3/uL (ref 0.1–0.9)
Monocytes: 6 %
Neutrophils Absolute: 8.1 10*3/uL — ABNORMAL HIGH (ref 1.4–7.0)
Neutrophils: 69 %
Platelets: 232 10*3/uL (ref 150–450)
RBC: 3.64 x10E6/uL — ABNORMAL LOW (ref 3.77–5.28)
RDW: 12.8 % (ref 11.7–15.4)
WBC: 11.8 10*3/uL — ABNORMAL HIGH (ref 3.4–10.8)

## 2024-02-09 LAB — BASIC METABOLIC PANEL WITH GFR
BUN/Creatinine Ratio: 13 (ref 9–23)
BUN: 10 mg/dL (ref 6–24)
CO2: 24 mmol/L (ref 20–29)
Calcium: 9.8 mg/dL (ref 8.7–10.2)
Chloride: 100 mmol/L (ref 96–106)
Creatinine, Ser: 0.8 mg/dL (ref 0.57–1.00)
Glucose: 73 mg/dL (ref 70–99)
Potassium: 3.9 mmol/L (ref 3.5–5.2)
Sodium: 137 mmol/L (ref 134–144)
eGFR: 91 mL/min/{1.73_m2} (ref >59)

## 2024-02-11 ENCOUNTER — Emergency Department

## 2024-02-11 ENCOUNTER — Other Ambulatory Visit: Payer: Self-pay

## 2024-02-11 ENCOUNTER — Emergency Department
Admission: EM | Admit: 2024-02-11 | Discharge: 2024-02-11 | Disposition: A | Discharge status: Home / Self Care | Attending: Emergency Medicine | Admitting: Emergency Medicine

## 2024-02-11 DIAGNOSIS — R079 Chest pain, unspecified: Secondary | ICD-10-CM | POA: Diagnosis present

## 2024-02-11 DIAGNOSIS — R109 Unspecified abdominal pain: Secondary | ICD-10-CM | POA: Insufficient documentation

## 2024-02-11 DIAGNOSIS — E041 Nontoxic single thyroid nodule: Secondary | ICD-10-CM | POA: Insufficient documentation

## 2024-02-11 LAB — URINALYSIS, ROUTINE W REFLEX MICROSCOPIC
Bilirubin Urine: NEGATIVE
Glucose, UA: NEGATIVE mg/dL
Hgb urine dipstick: NEGATIVE
Ketones, ur: NEGATIVE mg/dL
Leukocytes,Ua: NEGATIVE
Nitrite: NEGATIVE
Protein, ur: NEGATIVE mg/dL
Specific Gravity, Urine: 1.016 (ref 1.005–1.030)
pH: 7 (ref 5.0–8.0)

## 2024-02-11 LAB — CBC
HCT: 35.7 % — ABNORMAL LOW (ref 36.0–46.0)
Hemoglobin: 12.1 g/dL (ref 12.0–15.0)
MCH: 31.5 pg (ref 26.0–34.0)
MCHC: 33.9 g/dL (ref 30.0–36.0)
MCV: 93 fL (ref 80.0–100.0)
Platelets: 240 10*3/uL (ref 150–400)
RBC: 3.84 MIL/uL — ABNORMAL LOW (ref 3.87–5.11)
RDW: 12.5 % (ref 11.5–15.5)
WBC: 8.1 10*3/uL (ref 4.0–10.5)
nRBC: 0 % (ref 0.0–0.2)

## 2024-02-11 LAB — COMPREHENSIVE METABOLIC PANEL WITH GFR
ALT: 19 U/L (ref 0–44)
AST: 16 U/L (ref 15–41)
Albumin: 3.8 g/dL (ref 3.5–5.0)
Alkaline Phosphatase: 47 U/L (ref 38–126)
Anion gap: 10 (ref 5–15)
BUN: 11 mg/dL (ref 6–20)
CO2: 24 mmol/L (ref 22–32)
Calcium: 9.5 mg/dL (ref 8.9–10.3)
Chloride: 101 mmol/L (ref 98–111)
Creatinine, Ser: 0.68 mg/dL (ref 0.44–1.00)
GFR, Estimated: >60 mL/min (ref >60)
Glucose, Bld: 106 mg/dL — ABNORMAL HIGH (ref 70–99)
Potassium: 3.9 mmol/L (ref 3.5–5.1)
Sodium: 135 mmol/L (ref 135–145)
Total Bilirubin: 0.5 mg/dL (ref 0.0–1.2)
Total Protein: 7.5 g/dL (ref 6.5–8.1)

## 2024-02-11 LAB — LIPASE, BLOOD: Lipase: 29 U/L (ref 11–51)

## 2024-02-11 LAB — D-DIMER, QUANTITATIVE: D-Dimer, Quant: 0.52 ug{FEU}/mL — ABNORMAL HIGH (ref 0.00–0.50)

## 2024-02-11 LAB — TSH: TSH: 0.95 u[IU]/mL (ref 0.350–4.500)

## 2024-02-11 LAB — T4, FREE: Free T4: 0.67 ng/dL (ref 0.61–1.12)

## 2024-02-11 LAB — POC URINE PREG, ED: Preg Test, Ur: NEGATIVE

## 2024-02-11 LAB — TROPONIN I (HIGH SENSITIVITY)
Troponin I (High Sensitivity): <2 ng/L (ref <18)
Troponin I (High Sensitivity): 3 ng/L (ref <18)

## 2024-02-11 MED ORDER — ONDANSETRON HCL 4 MG/2ML IJ SOLN
4.0000 mg | Freq: Once | INTRAMUSCULAR | Status: AC
  Administered 2024-02-11: 4 mg via INTRAVENOUS
  Filled 2024-02-11: qty 2

## 2024-02-11 MED ORDER — IOHEXOL 350 MG/ML SOLN
50.0000 mL | Freq: Once | INTRAVENOUS | Status: AC | PRN
  Administered 2024-02-11: 50 mL via INTRAVENOUS

## 2024-02-11 MED ORDER — SODIUM CHLORIDE 0.9 % IV BOLUS
500.0000 mL | Freq: Once | INTRAVENOUS | Status: AC
  Administered 2024-02-11: 500 mL via INTRAVENOUS

## 2024-02-11 MED ORDER — KETOROLAC TROMETHAMINE 15 MG/ML IJ SOLN
15.0000 mg | Freq: Once | INTRAMUSCULAR | Status: AC
  Administered 2024-02-11: 15 mg via INTRAVENOUS
  Filled 2024-02-11: qty 1

## 2024-02-11 NOTE — ED Triage Notes (Signed)
 Pt sts that she has recently been dx with SVT. Pt sts that she is seeing Dr. Aida House and was put on a new medication. Pt sts that she has been feeling chest pain with right arm numbness. Pt is very tearful in triage. Pt sts that Dr. Aida House is not listening to me and I am not sure what is going on but I dont feel well.

## 2024-02-11 NOTE — ED Provider Notes (Signed)
 [facility] Provider Note    Event Date/Time   First MD Initiated Contact with Patient 02/11/24 0930     (approximate)   History   Chest Pain   HPI  Sandra Aguilar is a 48 y.o. female past medical history significant for history of SVT, fibromyalgia, PTSD, who presents to the emergency department with chest pain.  Patient states that she woke up today and was having chest pain.  Pain felt like it was radiating to her right arm.  Mildly feeling short of breath.  Feeling nauseous and having some mild abdominal pain.  States that she has a history of SVT and she is uncertain of what is causing her symptoms.  Denies any history of DVT or PE.  Scheduled to have a cardiac catheterization with Dr. Meredeth Stallion.  No falls or trauma.     Physical Exam   Triage Vital Signs: ED Triage Vitals [02/11/24 0924]  Encounter Vitals Group     BP 132/69     Systolic BP Percentile      Diastolic BP Percentile      Pulse Rate (!) 104     Resp 18     Temp 97.8 F (36.6 C)     Temp Source Oral     SpO2 100 %     Weight 145 lb (65.8 kg)     Height 5\' 5"  (1.651 m)     Head Circumference      Peak Flow      Pain Score 7     Pain Loc      Pain Education      Exclude from Growth Chart     Most recent vital signs: Vitals:   02/11/24 0924 02/11/24 1100  BP: 132/69 116/61  Pulse: (!) 104 79  Resp: 18 16  Temp: 97.8 F (36.6 C)   SpO2: 100% 99%    Physical Exam Constitutional:      Appearance: She is well-developed.  HENT:     Head: Atraumatic.  Eyes:     Extraocular Movements: Extraocular movements intact.     Conjunctiva/sclera: Conjunctivae normal.     Pupils: Pupils are equal, round, and reactive to light.  Cardiovascular:     Rate and Rhythm: Regular rhythm.     Pulses:          Radial pulses are 2+ on the right side and 2+ on the left side.       Dorsalis pedis pulses are 2+ on the right side and 2+ on the left side.     Heart sounds: Normal heart sounds.  No murmur heard. Pulmonary:     Effort: No respiratory distress.     Breath sounds: No decreased breath sounds.  Abdominal:     General: There is no distension.     Palpations: Abdomen is soft.     Tenderness: There is no abdominal tenderness.  Musculoskeletal:        General: Normal range of motion.     Cervical back: Normal range of motion.     Right lower leg: No edema.     Left lower leg: No edema.  Skin:    General: Skin is warm.     Capillary Refill: Capillary refill takes less than 2 seconds.  Neurological:     General: No focal deficit present.     Mental Status: She is alert. Mental status is at baseline.     IMPRESSION / MDM / ASSESSMENT AND PLAN /  ED COURSE  I reviewed the triage vital signs and the nursing notes.  Differential diagnosis including ACS, anemia, pulmonary embolism, pericarditis, gastritis/PUD, electrolyte abnormality, fibromyalgia.  Intact and symmetric pulses, no tearing pain, have low suspicion for dissection.  EKG  I, Viviano Ground, the attending physician, personally viewed and interpreted this ECG. EKG showed sinus tachycardia with a heart rate of 104.  QTc mildly prolonged at 515.  Narrow complex.  Nonspecific ST changes.  T wave inverted to the inferior leads.  Sinus tachycardia while on cardiac telemetry.  RADIOLOGY I independently reviewed imaging, my interpretation of imaging: Chest x-ray -no signs of pneumonia  CTA with no signs of pulmonary embolism.  Incidental thyroid nodule noted and discussed with the patient.  LABS (all labs ordered are listed, but only abnormal results are displayed) Labs interpreted as -    Labs Reviewed  CBC - Abnormal; Notable for the following components:      Result Value   RBC 3.84 (*)    HCT 35.7 (*)    All other components within normal limits  D-DIMER, QUANTITATIVE - Abnormal; Notable for the following components:   D-Dimer, Quant 0.52 (*)    All other components within normal limits   COMPREHENSIVE METABOLIC PANEL WITH GFR - Abnormal; Notable for the following components:   Glucose, Bld 106 (*)    All other components within normal limits  URINALYSIS, ROUTINE W REFLEX MICROSCOPIC - Abnormal; Notable for the following components:   Color, Urine COLORLESS (*)    APPearance CLEAR (*)    All other components within normal limits  LIPASE, BLOOD  TSH  T4, FREE  POC URINE PREG, ED  TROPONIN I (HIGH SENSITIVITY)  TROPONIN I (HIGH SENSITIVITY)     MDM  On chart review patient is known to Dr. Meredeth Stallion, has a prior equivocal nuclear med stress test with a normal EF.  Scheduled to have cardiac catheterization given ongoing chest pain without an obvious cause. Clinical Course as of 02/11/24 1249  Sun Feb 11, 2024  1029 D-dimer positive will order a CTA to further evaluate for pulmonary embolism [SM]    Clinical Course User Index [SM] Viviano Ground, MD   CTA was negative and no signs of pulmonary embolism.  Serial troponins are undetectable.  On reevaluation patient chest pain-free.  Atypical symptoms and have a low suspicion for ACS.  No significant anemia.  Tolerating p.o. and states she is feeling much better.  Discussed abnormal findings of the thyroid nodule.  No signs of urinary tract infection and thyroid studies within normal limits.  Patient states that she will follow-up closely with her cardiologist and primary care provider.  States that she would return for any ongoing or worsening symptoms.  PROCEDURES:  Critical Care performed: No  Procedures  Patient's presentation is most consistent with acute presentation with potential threat to life or bodily function.   MEDICATIONS ORDERED IN ED: Medications  sodium chloride 0.9 % bolus 500 mL (0 mLs Intravenous Stopped 02/11/24 1046)  ondansetron (ZOFRAN) injection 4 mg (4 mg Intravenous Given 02/11/24 0957)  ketorolac (TORADOL) 15 MG/ML injection 15 mg (15 mg Intravenous Given 02/11/24 0957)  iohexol (OMNIPAQUE) 350  MG/ML injection 50 mL (50 mLs Intravenous Contrast Given 02/11/24 1136)    FINAL CLINICAL IMPRESSION(S) / ED DIAGNOSES   Final diagnoses:  Thyroid nodule  Chest pain, unspecified type     Rx / DC Orders   ED Discharge Orders     None  Note:  This document was prepared using Dragon voice recognition software and may include unintentional dictation errors.   Viviano Ground, MD 02/11/24 1249

## 2024-02-11 NOTE — Discharge Instructions (Signed)
 You are seen in the emergency department for chest pain.  You had a CT scan that did not show any signs of blood clots.  You have 2 heart enzymes that were normal and do not believe that you are having a heart attack today.  Your lab work was normal.  You had a thyroid nodule found on your CT scan, discussed this with your primary care physician so this can be worked up as an outpatient.  Your thyroid studies were within normal limits.  Call discussed with your cardiologist Dr. Meredeth Stallion your ER visit today.  Return to the emergency department if your symptoms return.

## 2024-02-20 ENCOUNTER — Encounter
Admission: RE | Disposition: A | Discharge status: Home / Self Care | Payer: Self-pay | Source: Home / Self Care | Attending: Cardiovascular Disease

## 2024-02-20 ENCOUNTER — Ambulatory Visit
Admission: RE | Admit: 2024-02-20 | Discharge: 2024-02-20 | Disposition: A | Discharge status: Home / Self Care | Attending: Cardiovascular Disease | Admitting: Cardiovascular Disease

## 2024-02-20 ENCOUNTER — Other Ambulatory Visit: Payer: Self-pay

## 2024-02-20 ENCOUNTER — Encounter: Payer: Self-pay | Admitting: Cardiovascular Disease

## 2024-02-20 DIAGNOSIS — I471 Supraventricular tachycardia, unspecified: Secondary | ICD-10-CM | POA: Insufficient documentation

## 2024-02-20 DIAGNOSIS — I200 Unstable angina: Secondary | ICD-10-CM | POA: Diagnosis not present

## 2024-02-20 DIAGNOSIS — R002 Palpitations: Secondary | ICD-10-CM | POA: Diagnosis present

## 2024-02-20 DIAGNOSIS — R42 Dizziness and giddiness: Secondary | ICD-10-CM | POA: Insufficient documentation

## 2024-02-20 DIAGNOSIS — R0789 Other chest pain: Secondary | ICD-10-CM | POA: Insufficient documentation

## 2024-02-20 DIAGNOSIS — R55 Syncope and collapse: Secondary | ICD-10-CM | POA: Diagnosis present

## 2024-02-20 HISTORY — PX: LEFT HEART CATH AND CORONARY ANGIOGRAPHY: CATH118249

## 2024-02-20 SURGERY — LEFT HEART CATH AND CORONARY ANGIOGRAPHY
Anesthesia: Moderate Sedation | Laterality: Left

## 2024-02-20 MED ORDER — SODIUM CHLORIDE 0.9% FLUSH: 3.0000 mL | INTRAVENOUS | Status: DC | PRN

## 2024-02-20 MED ORDER — SODIUM CHLORIDE 0.9% FLUSH
3.0000 mL | INTRAVENOUS | Status: DC | PRN
Start: 2024-02-20 — End: 2024-02-20

## 2024-02-20 MED ORDER — SODIUM CHLORIDE 0.9 % WEIGHT BASED INFUSION: 1.0000 mL/kg/h | INTRAVENOUS | Status: DC

## 2024-02-20 MED ORDER — FENTANYL CITRATE (PF) 100 MCG/2ML IJ SOLN
INTRAMUSCULAR | Status: AC
  Filled 2024-02-20: qty 2

## 2024-02-20 MED ORDER — LABETALOL HCL 5 MG/ML IV SOLN: 10.0000 mg | INTRAVENOUS | Status: DC | PRN

## 2024-02-20 MED ORDER — ASPIRIN 81 MG PO CHEW
CHEWABLE_TABLET | ORAL | Status: AC
  Filled 2024-02-20: qty 1

## 2024-02-20 MED ORDER — SODIUM CHLORIDE 0.9 % WEIGHT BASED INFUSION
3.0000 mL/kg/h | INTRAVENOUS | Status: DC
  Administered 2024-02-20: 3 mL/kg/h via INTRAVENOUS

## 2024-02-20 MED ORDER — MIDAZOLAM HCL 2 MG/2ML IJ SOLN
INTRAMUSCULAR | Status: AC
  Filled 2024-02-20: qty 2

## 2024-02-20 MED ORDER — ASPIRIN 81 MG PO CHEW
81.0000 mg | CHEWABLE_TABLET | ORAL | Status: AC
  Administered 2024-02-20: 81 mg via ORAL

## 2024-02-20 MED ORDER — IOHEXOL 300 MG/ML  SOLN
INTRAMUSCULAR | Status: DC | PRN
Start: 2024-02-20 — End: 2024-02-20
  Administered 2024-02-20: 48 mL

## 2024-02-20 MED ORDER — SODIUM CHLORIDE 0.9 % IV SOLN: 250.0000 mL | INTRAVENOUS | Status: DC | PRN

## 2024-02-20 MED ORDER — HEPARIN (PORCINE) IN NACL 1000-0.9 UT/500ML-% IV SOLN
INTRAVENOUS | Status: DC | PRN
  Administered 2024-02-20: 1000 mL

## 2024-02-20 MED ORDER — MIDAZOLAM HCL 2 MG/2ML IJ SOLN
INTRAMUSCULAR | Status: DC | PRN
  Administered 2024-02-20 (×2): 1 mg via INTRAVENOUS

## 2024-02-20 MED ORDER — HEPARIN (PORCINE) IN NACL 1000-0.9 UT/500ML-% IV SOLN
INTRAVENOUS | Status: AC
  Filled 2024-02-20: qty 500

## 2024-02-20 MED ORDER — HYDRALAZINE HCL 20 MG/ML IJ SOLN: 10.0000 mg | INTRAMUSCULAR | Status: DC | PRN

## 2024-02-20 MED ORDER — LIDOCAINE HCL 1 % IJ SOLN
INTRAMUSCULAR | Status: AC
  Filled 2024-02-20: qty 20

## 2024-02-20 MED ORDER — ACETAMINOPHEN 325 MG PO TABS: 650.0000 mg | ORAL_TABLET | ORAL | Status: DC | PRN

## 2024-02-20 MED ORDER — FENTANYL CITRATE (PF) 100 MCG/2ML IJ SOLN
INTRAMUSCULAR | Status: DC | PRN
  Administered 2024-02-20 (×2): 25 ug via INTRAVENOUS

## 2024-02-20 MED ORDER — LIDOCAINE HCL (PF) 1 % IJ SOLN
INTRAMUSCULAR | Status: DC | PRN
  Administered 2024-02-20: 40 mL

## 2024-02-20 MED ORDER — ONDANSETRON HCL 4 MG/2ML IJ SOLN: 4.0000 mg | Freq: Four times a day (QID) | INTRAMUSCULAR | Status: DC | PRN

## 2024-02-20 MED ORDER — SODIUM CHLORIDE 0.9% FLUSH: 3.0000 mL | Freq: Two times a day (BID) | INTRAVENOUS | Status: DC

## 2024-02-20 SURGICAL SUPPLY — 11 items
CATH INFINITI 5FR JL4 (CATHETERS) IMPLANT
CATH INFINITI JR4 5F (CATHETERS) IMPLANT
DEVICE CLOSURE MYNXGRIP 5F (Vascular Products) IMPLANT
KIT MICROPUNCTURE NIT STIFF (SHEATH) IMPLANT
NDL PERC 18GX7CM (NEEDLE) IMPLANT
NEEDLE PERC 18GX7CM (NEEDLE) ×1 IMPLANT
PACK CARDIAC CATH (CUSTOM PROCEDURE TRAY) ×1 IMPLANT
SET ATX-X65L (MISCELLANEOUS) IMPLANT
SHEATH AVANTI 5FR X 11CM (SHEATH) IMPLANT
STATION PROTECTION PRESSURIZED (MISCELLANEOUS) IMPLANT
WIRE GUIDERIGHT .035X150 (WIRE) IMPLANT

## 2024-02-20 NOTE — Discharge Instructions (Signed)

## 2024-02-23 ENCOUNTER — Encounter: Payer: Self-pay | Admitting: Cardiovascular Disease

## 2024-02-23 ENCOUNTER — Ambulatory Visit: Admitting: Cardiovascular Disease

## 2024-02-23 VITALS — BP 118/66 | HR 84 | Ht 65.0 in | Wt 137.0 lb

## 2024-02-23 DIAGNOSIS — R55 Syncope and collapse: Secondary | ICD-10-CM | POA: Diagnosis not present

## 2024-02-23 DIAGNOSIS — R002 Palpitations: Secondary | ICD-10-CM

## 2024-02-23 DIAGNOSIS — I471 Supraventricular tachycardia, unspecified: Secondary | ICD-10-CM | POA: Diagnosis not present

## 2024-02-23 DIAGNOSIS — R1031 Right lower quadrant pain: Secondary | ICD-10-CM

## 2024-02-23 DIAGNOSIS — R0789 Other chest pain: Secondary | ICD-10-CM | POA: Diagnosis not present

## 2024-02-23 MED ORDER — PANTOPRAZOLE SODIUM 40 MG PO TBEC: 40.0000 mg | DELAYED_RELEASE_TABLET | Freq: Every day | ORAL | 1 refills | Status: DC

## 2024-02-23 NOTE — Progress Notes (Signed)
 Cardiology Office Note   Date:  02/23/2024   ID:  Sandra Aguilar, DOB December 20, 1975, MRN [id number]  PCP:  Trenda Frisk, FNP  Cardiologist:  Debborah Fairly, MD      History of Present Illness: Sandra Aguilar is a 48 y.o. female who presents for  Chief Complaint  Patient presents with   Follow-up    Cath Follow Up    HPI    Past Medical History:  Diagnosis Date   Cough 07/16/2020   Sandra Aguilar presents via virtual visit with complaints of cough with dark yellow and green mucous, nasal congestion and stuffiness, low grade fevers, sore throat, nausea, vomiting, and recent sick exposure. She had a recent negative covid test last week, and started Augmentin  for a URI after her negative Covid results.  She states she started feeling a little better then started feeling bad again.     Depression    Family history of breast cancer 05/27/2018   Fibromyalgia    Insomnia    Lung nodule    Lung nodule, solitary 09/04/2020   PTSD (post-traumatic stress disorder)    Strep pharyngitis 02/17/2021   Last Assessment & Plan: Formatting of this note might be different from the original. Will not check a rapid strep test for her sore throat due to her husband was positive for strep throat diagnosed yesterday in the same clinic.  Will prescribe Pen-Vee K 500 mg 1 every 12 hours for 10 days.  Be sure to drink plenty of fluids including Gatorade, take Tylenol  or Advil  for pain or fever as needed.  I   Stroke [facility])    Suspected COVID-19 virus infection 06/29/2020   Formatting of this note might be different from the original. Sandra Aguilar presents via virtual visit with complaints of cough with dark yellow and green mucous, nasal congestion and stuffiness, low grade fevers, sore throat, nausea, vomiting, and recent sick exposure. She had a recent negative covid test last week, and started Augmentin  for a URI after her negative Covid results.  She states she start   Thyroid  nodule      Past Surgical  History:  Procedure Laterality Date   APPENDECTOMY     CHOLECYSTECTOMY     LEFT HEART CATH AND CORONARY ANGIOGRAPHY Left 02/20/2024   Procedure: LEFT HEART CATH AND CORONARY ANGIOGRAPHY;  Surgeon: Cherrie Cornwall, MD;  Location: [facility] INVASIVE CV LAB;  Service: Cardiovascular;  Laterality: Left;     Current Outpatient Medications  Medication Sig Dispense Refill   pantoprazole (PROTONIX) 40 MG tablet Take 1 tablet (40 mg total) by mouth daily. 30 tablet 1   albuterol  (VENTOLIN  HFA) 108 (90 Base) MCG/ACT inhaler Inhale 1-2 puffs into the lungs every 6 (six) hours as needed for wheezing or shortness of breath. 18 each 3   amoxicillin -clavulanate (AUGMENTIN ) 875-125 MG tablet Take 1 tablet by mouth 2 (two) times daily. (Patient not taking: Reported on 02/20/2024) 20 tablet 0   amphetamine -dextroamphetamine  (ADDERALL) 20 MG tablet Take 1 tablet (20 mg total) by mouth 2 (two) times daily. 60 tablet 0   fexofenadine  (ALLEGRA ) 180 MG tablet Take 1 tablet (180 mg total) by mouth daily. 90 tablet 1   flecainide  (TAMBOCOR ) 50 MG tablet Take 1 tablet (50 mg total) by mouth 2 (two) times daily. 60 tablet 11   fluconazole  (DIFLUCAN ) 150 MG tablet Take 1 tablet (150 mg total) by mouth daily. 5 tablet 0   fluticasone  (FLONASE ) 50 MCG/ACT nasal spray Place 2 sprays into  both nostrils daily. 16 g 0   HYDROcodone -acetaminophen  (NORCO) 10-325 MG tablet Take 1 tablet by mouth every 8 (eight) hours as needed for severe pain (pain score 7-10). 90 tablet 0   hydrOXYzine  (ATARAX ) 25 MG tablet TAKE 1 TABLET (25 MG TOTAL) BY MOUTH 3 TIMES A DAY AS NEEDED FOR ITCHING OR ANXIETY (Patient not taking: Reported on 02/05/2024) 270 tablet 1   hyoscyamine  (LEVSIN SL) 0.125 MG SL tablet Place 1 tablet (0.125 mg total) under the tongue every 4 (four) hours as needed. 30 tablet 0   ibuprofen  (ADVIL ) 800 MG tablet Take 1 tablet (800 mg total) by mouth every 8 (eight) hours as needed. 30 tablet 0   LORazepam  (ATIVAN ) 2 MG tablet Take 1  tablet (2 mg total) by mouth every 8 (eight) hours as needed for anxiety. 90 tablet 2   ondansetron  (ZOFRAN -ODT) 8 MG disintegrating tablet Take 1 tablet (8 mg total) by mouth every 8 (eight) hours as needed for nausea or vomiting. 20 tablet 0   tiZANidine  (ZANAFLEX ) 4 MG tablet Take 1 tablet (4 mg total) by mouth 3 (three) times daily. 90 tablet 3   trazodone  (DESYREL ) 300 MG tablet Take 1 tablet (300 mg total) by mouth at bedtime. 90 tablet 0   No current facility-administered medications for this visit.   Facility-Administered Medications Ordered in Other Visits  Medication Dose Route Frequency Provider Last Rate Last Admin   sodium chloride  flush (NS) 0.9 % injection 3 mL  3 mL Intravenous Q12H Debborah Fairly A, MD        Allergies:   Ciprofloxacin, Nitrofurantoin, Promethazine, and Sulfa antibiotics    Social History:   reports that she has never smoked. She has never been exposed to tobacco smoke. She has never used smokeless tobacco. She reports that she does not currently use alcohol. She reports that she does not use drugs.   Family History:  family history includes Heart attack in her father and maternal grandmother; Obesity in her paternal aunt; Ovarian cancer in her maternal grandmother; Stroke in her paternal grandmother; Vascular Disease in her father, maternal grandfather, maternal uncle, and paternal aunt.    ROS:     Review of Systems  Constitutional: Negative.   HENT: Negative.    Eyes: Negative.   Respiratory: Negative.    Gastrointestinal: Negative.   Genitourinary: Negative.   Musculoskeletal: Negative.   Skin: Negative.   Neurological: Negative.   Endo/Heme/Allergies: Negative.   Psychiatric/Behavioral: Negative.    All other systems reviewed and are negative.     All other systems are reviewed and negative.    PHYSICAL EXAM: VS:  BP 118/66   Pulse 84   Ht 5\' 5"  (1.651 m)   Wt 137 lb (62.1 kg)   LMP 02/11/2024 Comment: patient signed pregnancy test  waiver  SpO2 94%   BMI 22.80 kg/m  , BMI Body mass index is 22.8 kg/m. Last weight:  Wt Readings from Last 3 Encounters:  02/23/24 137 lb (62.1 kg)  02/20/24 139 lb 1.6 oz (63.1 kg)  02/11/24 145 lb (65.8 kg)     Physical Exam Constitutional:      Appearance: Normal appearance.  Cardiovascular:     Rate and Rhythm: Normal rate and regular rhythm.     Heart sounds: Normal heart sounds.  Pulmonary:     Effort: Pulmonary effort is normal.     Breath sounds: Normal breath sounds.  Musculoskeletal:     Right lower leg: No edema.  Left lower leg: No edema.  Neurological:     Mental Status: She is alert.       EKG:   Recent Labs: 02/11/2024: ALT 19; BUN 11; Creatinine, Ser 0.68; Hemoglobin 12.1; Platelets 240; Potassium 3.9; Sodium 135; TSH 0.950    Lipid Panel No results found for: "CHOL", "TRIG", "HDL", "CHOLHDL", "VLDL", "LDLCALC", "LDLDIRECT"    Other studies Reviewed: Additional studies/ records that were reviewed today include:  Review of the above records demonstrates:       No data to display            ASSESSMENT AND PLAN:    ICD-10-CM   1. Syncope, unspecified syncope type  R55 pantoprazole (PROTONIX) 40 MG tablet    2. Palpitations  R00.2 pantoprazole (PROTONIX) 40 MG tablet    3. Chest pain, non-cardiac  R07.89 pantoprazole (PROTONIX) 40 MG tablet   Normal coronaries and normal LVEF on cardiac cath this week. Place on protonix 40 as probably GERD causing chest pain    4. SVT (supraventricular tachycardia) (HCC)  I47.10     5. Right groin pain  R10.31    no bruit, echymosis, no hematoma       Problem List Items Addressed This Visit       Cardiovascular and Mediastinum   SVT (supraventricular tachycardia) (HCC)     Other   Palpitations   Relevant Medications   pantoprazole (PROTONIX) 40 MG tablet   Syncope - Primary   Relevant Medications   pantoprazole (PROTONIX) 40 MG tablet   Other Visit Diagnoses       Chest pain,  non-cardiac       Normal coronaries and normal LVEF on cardiac cath this week. Place on protonix 40 as probably GERD causing chest pain   Relevant Medications   pantoprazole (PROTONIX) 40 MG tablet     Right groin pain       no bruit, echymosis, no hematoma          Disposition:   Return in about 2 months (around 04/24/2024) for Give doctors note for 2 days.    Total time spent: 40 minutes  Signed,  Debborah Fairly, MD  02/23/2024 11:29 AM    [facility]

## 2024-02-26 ENCOUNTER — Encounter: Payer: Self-pay | Admitting: Family

## 2024-03-04 ENCOUNTER — Other Ambulatory Visit: Payer: Self-pay | Admitting: Family

## 2024-03-04 DIAGNOSIS — M797 Fibromyalgia: Secondary | ICD-10-CM

## 2024-03-04 DIAGNOSIS — G8929 Other chronic pain: Secondary | ICD-10-CM

## 2024-03-05 ENCOUNTER — Ambulatory Visit: Admitting: Cardiovascular Disease

## 2024-03-05 MED ORDER — HYDROCODONE-ACETAMINOPHEN 10-325 MG PO TABS: 1.0000 | ORAL_TABLET | Freq: Three times a day (TID) | ORAL | 0 refills | Status: DC | PRN

## 2024-03-06 ENCOUNTER — Ambulatory Visit: Admitting: Family

## 2024-03-06 ENCOUNTER — Encounter: Payer: Self-pay | Admitting: Family

## 2024-03-06 VITALS — BP 106/40 | HR 77 | Ht 65.0 in | Wt 144.0 lb

## 2024-03-06 DIAGNOSIS — R5383 Other fatigue: Secondary | ICD-10-CM

## 2024-03-06 DIAGNOSIS — M25561 Pain in right knee: Secondary | ICD-10-CM | POA: Diagnosis not present

## 2024-03-06 DIAGNOSIS — Z0130 Encounter for examination of blood pressure without abnormal findings: Secondary | ICD-10-CM

## 2024-03-06 DIAGNOSIS — E538 Deficiency of other specified B group vitamins: Secondary | ICD-10-CM

## 2024-03-06 DIAGNOSIS — R7303 Prediabetes: Secondary | ICD-10-CM

## 2024-03-06 DIAGNOSIS — G8929 Other chronic pain: Secondary | ICD-10-CM

## 2024-03-06 DIAGNOSIS — G939 Disorder of brain, unspecified: Secondary | ICD-10-CM

## 2024-03-06 DIAGNOSIS — E041 Nontoxic single thyroid nodule: Secondary | ICD-10-CM

## 2024-03-06 DIAGNOSIS — E782 Mixed hyperlipidemia: Secondary | ICD-10-CM

## 2024-03-06 DIAGNOSIS — E559 Vitamin D deficiency, unspecified: Secondary | ICD-10-CM

## 2024-03-06 DIAGNOSIS — Z809 Family history of malignant neoplasm, unspecified: Secondary | ICD-10-CM

## 2024-03-06 MED ORDER — DIAZEPAM 2 MG PO TABS: 2.0000 mg | ORAL_TABLET | Freq: Once | ORAL | 0 refills | Status: AC

## 2024-03-06 MED ORDER — BELSOMRA 10 MG PO TABS: 10.0000 mg | ORAL_TABLET | Freq: Every day | ORAL | 2 refills | Status: DC

## 2024-03-06 NOTE — Progress Notes (Signed)
 Established Patient Office Visit  Subjective:  Patient ID: Sandra Aguilar, female    DOB: July 29, 1976  Age: 48 y.o. MRN: [id number]  Chief Complaint  Patient presents with   Follow-up    1 month follow up    Patient is here today for her 1 month follow up.  She has been feeling poorly since last appointment.   She does not have additional concerns to discuss today.  Labs are due today.  She needs refills.   I have reviewed her active problem list, medication list, allergies, notes from last encounter, lab results for her appointment today.    No other concerns at this time.   Past Medical History:  Diagnosis Date   Cough 07/16/2020   Sandra Aguilar presents via virtual visit with complaints of cough with dark yellow and green mucous, nasal congestion and stuffiness, low grade fevers, sore throat, nausea, vomiting, and recent sick exposure. She had a recent negative covid test last week, and started Augmentin  for a URI after her negative Covid results.  She states she started feeling a little better then started feeling bad again.     Depression    Family history of breast cancer 05/27/2018   Fibromyalgia    Insomnia    Lung nodule    Lung nodule, solitary 09/04/2020   PTSD (post-traumatic stress disorder)    Strep pharyngitis 02/17/2021   Last Assessment & Plan: Formatting of this note might be different from the original. Will not check a rapid strep test for her sore throat due to her husband was positive for strep throat diagnosed yesterday in the same clinic.  Will prescribe Pen-Vee K 500 mg 1 every 12 hours for 10 days.  Be sure to drink plenty of fluids including Gatorade, take Tylenol  or Advil  for pain or fever as needed.  I   Stroke Sandra Aguilar)    Suspected COVID-19 virus infection 06/29/2020   Formatting of this note might be different from the original. Miral presents via virtual visit with complaints of cough with dark yellow and green mucous, nasal congestion and stuffiness,  low grade fevers, sore throat, nausea, vomiting, and recent sick exposure. She had a recent negative covid test last week, and started Augmentin  for a URI after her negative Covid results.  She states she start   Thyroid  nodule     Past Surgical History:  Procedure Laterality Date   APPENDECTOMY     CHOLECYSTECTOMY     LEFT HEART CATH AND CORONARY ANGIOGRAPHY Left 02/20/2024   Procedure: LEFT HEART CATH AND CORONARY ANGIOGRAPHY;  Surgeon: Sandra Denyse LABOR, MD;  Location: [facility] INVASIVE CV LAB;  Service: Cardiovascular;  Laterality: Left;    Social History   Socioeconomic History   Marital status: Married    Spouse name: Not on file   Number of children: Not on file   Years of education: Not on file   Highest education level: Not on file  Occupational History   Not on file  Tobacco Use   Smoking status: Never    Passive exposure: Never   Smokeless tobacco: Never  Vaping Use   Vaping status: Never Used  Substance and Sexual Activity   Alcohol use: Not Currently    Comment: ocassionally   Drug use: Never   Sexual activity: Yes    Birth control/protection: None, Other-see comments    Comment: husband has vasectomy  Other Topics Concern   Not on file  Social History Narrative   Not on file  Social Drivers of Corporate investment banker Strain: Not on file  Food Insecurity: Not on file  Transportation Needs: Not on file  Physical Activity: Not on file  Stress: Not on file  Social Connections: Unknown (05/08/2020)   Received from Sandra Aguilar, The   Social Connections    Frequency of Communication with Friends and Family: Not asked    Frequency of Social Gatherings with Friends and Family: Not asked  Intimate Partner Violence: Unknown (05/08/2020)   Received from Sandra Aguilar   Intimate Partner Violence    Fear of Current or Ex-Partner: Not asked    Emotionally Abused: Not asked    Physically Abused: Not asked    Sexually Abused: Not asked    Family History  Problem  Relation Age of Onset   Heart attack Father    Vascular Disease Father    Vascular Disease Maternal Uncle    Obesity Paternal Aunt    Vascular Disease Paternal Aunt    Heart attack Maternal Grandmother    Ovarian cancer Maternal Grandmother    Vascular Disease Maternal Grandfather    Stroke Paternal Grandmother     Allergies  Allergen Reactions   Ciprofloxacin Nausea And Vomiting and Other (See Comments)    Neuropathy  Neuropathy  Neuropathy  Neuropathy  Neuropathy   Nitrofurantoin Hives    Other reaction(s): Hives/Swelling-Allergy   Sulfa Antibiotics Nausea And Vomiting, Other (See Comments) and Rash    CAUSES PAIN, PT REPORTS    Review of Systems  Constitutional:  Positive for malaise/fatigue.  Musculoskeletal:  Positive for back pain, joint pain, myalgias and neck pain.  Psychiatric/Behavioral:  Positive for depression. The patient is nervous/anxious and has insomnia.   All other systems reviewed and are negative.      Objective:   BP (!) 106/40   Pulse 77   Ht 5' 5 (1.651 m)   Wt 144 lb (65.3 kg)   LMP 02/11/2024 Comment: patient signed pregnancy test waiver  SpO2 96%   BMI 23.96 kg/m   Vitals:   03/06/24 1431  BP: (!) 106/40  Pulse: 77  Height: 5' 5 (1.651 m)  Weight: 144 lb (65.3 kg)  SpO2: 96%  BMI (Calculated): 23.96    Physical Exam Vitals and nursing note reviewed.  Constitutional:      Appearance: Normal appearance. She is normal weight.  HENT:     Head: Normocephalic.  Eyes:     Extraocular Movements: Extraocular movements intact.     Conjunctiva/sclera: Conjunctivae normal.     Pupils: Pupils are equal, round, and reactive to light.  Cardiovascular:     Rate and Rhythm: Normal rate.  Pulmonary:     Effort: Pulmonary effort is normal.  Neurological:     General: No focal deficit present.     Mental Status: She is alert and oriented to person, place, and time. Mental status is at baseline.  Psychiatric:        Mood and Affect:  Mood normal.        Behavior: Behavior normal.        Thought Content: Thought content normal.      Results for orders placed or performed in visit on 03/06/24  Lipid panel  Result Value Ref Range   Cholesterol, Total 191 100 - 199 mg/dL   Triglycerides 865 0 - 149 mg/dL   HDL 54 >60 mg/dL   VLDL Cholesterol Cal 24 5 - 40 mg/dL   LDL Chol Calc (NIH) 886 (H) 0 - 99  mg/dL   Chol/HDL Ratio 3.5 0.0 - 4.4 ratio  VITAMIN D  25 Hydroxy (Vit-D Deficiency, Fractures)  Result Value Ref Range   Vit D, 25-Hydroxy 33.9 30.0 - 100.0 ng/mL  CMP14+EGFR  Result Value Ref Range   Glucose 93 70 - 99 mg/dL   BUN 10 6 - 24 mg/dL   Creatinine, Ser 9.30 0.57 - 1.00 mg/dL   eGFR 892 >40 fO/fpw/8.26   BUN/Creatinine Ratio 14 9 - 23   Sodium 138 134 - 144 mmol/L   Potassium 4.2 3.5 - 5.2 mmol/L   Chloride 100 96 - 106 mmol/L   CO2 26 20 - 29 mmol/L   Calcium 9.8 8.7 - 10.2 mg/dL   Total Protein 6.8 6.0 - 8.5 g/dL   Albumin 4.0 3.9 - 4.9 g/dL   Globulin, Total 2.8 1.5 - 4.5 g/dL   Bilirubin Total <9.7 0.0 - 1.2 mg/dL   Alkaline Phosphatase 55 44 - 121 IU/L   AST 11 0 - 40 IU/L   ALT 10 0 - 32 IU/L  Hemoglobin A1c  Result Value Ref Range   Hgb A1c MFr Bld 5.3 4.8 - 5.6 %   Est. average glucose Bld gHb Est-mCnc 105 mg/dL  Vitamin A87  Result Value Ref Range   Vitamin B-12 541 232 - 1,245 pg/mL  CBC with Diff  Result Value Ref Range   WBC 7.7 3.4 - 10.8 x10E3/uL   RBC 3.73 (L) 3.77 - 5.28 x10E6/uL   Hemoglobin 12.0 11.1 - 15.9 g/dL   Hematocrit 63.9 65.9 - 46.6 %   MCV 97 79 - 97 fL   MCH 32.2 26.6 - 33.0 pg   MCHC 33.3 31.5 - 35.7 g/dL   RDW 87.2 88.2 - 84.5 %   Platelets 269 150 - 450 x10E3/uL   Neutrophils 53 Not Estab. %   Lymphs 33 Not Estab. %   Monocytes 7 Not Estab. %   Eos 6 Not Estab. %   Basos 1 Not Estab. %   Neutrophils Absolute 4.1 1.4 - 7.0 x10E3/uL   Lymphocytes Absolute 2.6 0.7 - 3.1 x10E3/uL   Monocytes Absolute 0.5 0.1 - 0.9 x10E3/uL   EOS (ABSOLUTE) 0.5 (H) 0.0 -  0.4 x10E3/uL   Basophils Absolute 0.1 0.0 - 0.2 x10E3/uL   Immature Granulocytes 0 Not Estab. %   Immature Grans (Abs) 0.0 0.0 - 0.1 x10E3/uL  TSH+T4F+T3Free  Result Value Ref Range   TSH 0.925 0.450 - 4.500 uIU/mL   T3, Free 2.8 2.0 - 4.4 pg/mL   Free T4 1.06 0.82 - 1.77 ng/dL  Iron, TIBC and Ferritin Panel  Result Value Ref Range   Total Iron Binding Capacity 329 250 - 450 ug/dL   UIBC 743 868 - 574 ug/dL   Iron 73 27 - 840 ug/dL   Iron Saturation 22 15 - 55 %   Ferritin 25 15 - 150 ng/mL  CA 125  Result Value Ref Range   Cancer Antigen (CA) 125 28.4 0.0 - 38.1 U/mL  CEA  Result Value Ref Range   CEA 1.4 0.0 - 4.7 ng/mL    Recent Results (from the past 2160 hours)  Lipid panel     Status: Abnormal   Collection Time: 03/06/24  3:19 PM  Result Value Ref Range   Cholesterol, Total 191 100 - 199 mg/dL   Triglycerides 865 0 - 149 mg/dL   HDL 54 >60 mg/dL   VLDL Cholesterol Cal 24 5 - 40 mg/dL   LDL Chol Calc (NIH)  113 (H) 0 - 99 mg/dL   Chol/HDL Ratio 3.5 0.0 - 4.4 ratio    Comment:                                   T. Chol/HDL Ratio                                             Men  Women                               1/2 Avg.Risk  3.4    3.3                                   Avg.Risk  5.0    4.4                                2X Avg.Risk  9.6    7.1                                3X Avg.Risk 23.4   11.0   VITAMIN D  25 Hydroxy (Vit-D Deficiency, Fractures)     Status: None   Collection Time: 03/06/24  3:19 PM  Result Value Ref Range   Vit D, 25-Hydroxy 33.9 30.0 - 100.0 ng/mL    Comment: Vitamin D  deficiency has been defined by the Institute of Medicine and an Endocrine Society practice guideline as a level of serum 25-OH vitamin D  less than 20 ng/mL (1,2). The Endocrine Society went on to further define vitamin D  insufficiency as a level between 21 and 29 ng/mL (2). 1. IOM (Institute of Medicine). 2010. Dietary reference    intakes for calcium and D. [place]:  The    [employer]. 2. Holick MF, Binkley [place], Bischoff-Ferrari HA, et al.    Evaluation, treatment, and prevention of vitamin D     deficiency: an Endocrine Society clinical practice    guideline. JCEM. 2011 Jul; 96(7):1911-30.   CMP14+EGFR     Status: None   Collection Time: 03/06/24  3:19 PM  Result Value Ref Range   Glucose 93 70 - 99 mg/dL   BUN 10 6 - 24 mg/dL   Creatinine, Ser 9.30 0.57 - 1.00 mg/dL   eGFR 892 >40 fO/fpw/8.26   BUN/Creatinine Ratio 14 9 - 23   Sodium 138 134 - 144 mmol/L   Potassium 4.2 3.5 - 5.2 mmol/L   Chloride 100 96 - 106 mmol/L   CO2 26 20 - 29 mmol/L   Calcium 9.8 8.7 - 10.2 mg/dL   Total Protein 6.8 6.0 - 8.5 g/dL   Albumin 4.0 3.9 - 4.9 g/dL   Globulin, Total 2.8 1.5 - 4.5 g/dL   Bilirubin Total <9.7 0.0 - 1.2 mg/dL   Alkaline Phosphatase 55 44 - 121 IU/L   AST 11 0 - 40 IU/L   ALT 10 0 - 32 IU/L  Hemoglobin A1c     Status: None   Collection Time: 03/06/24  3:19 PM  Result Value Ref Range   Hgb A1c MFr  Bld 5.3 4.8 - 5.6 %    Comment:          Prediabetes: 5.7 - 6.4          Diabetes: >6.4          Glycemic control for adults with diabetes: <7.0    Est. average glucose Bld gHb Est-mCnc 105 mg/dL  Vitamin B12     Status: None   Collection Time: 03/06/24  3:19 PM  Result Value Ref Range   Vitamin B-12 541 232 - 1,245 pg/mL  CBC with Diff     Status: Abnormal   Collection Time: 03/06/24  3:19 PM  Result Value Ref Range   WBC 7.7 3.4 - 10.8 x10E3/uL   RBC 3.73 (L) 3.77 - 5.28 x10E6/uL   Hemoglobin 12.0 11.1 - 15.9 g/dL   Hematocrit 63.9 65.9 - 46.6 %   MCV 97 79 - 97 fL   MCH 32.2 26.6 - 33.0 pg   MCHC 33.3 31.5 - 35.7 g/dL   RDW 87.2 88.2 - 84.5 %   Platelets 269 150 - 450 x10E3/uL   Neutrophils 53 Not Estab. %   Lymphs 33 Not Estab. %   Monocytes 7 Not Estab. %   Eos 6 Not Estab. %   Basos 1 Not Estab. %   Neutrophils Absolute 4.1 1.4 - 7.0 x10E3/uL   Lymphocytes Absolute 2.6 0.7 - 3.1 x10E3/uL   Monocytes Absolute  0.5 0.1 - 0.9 x10E3/uL   EOS (ABSOLUTE) 0.5 (H) 0.0 - 0.4 x10E3/uL   Basophils Absolute 0.1 0.0 - 0.2 x10E3/uL   Immature Granulocytes 0 Not Estab. %   Immature Grans (Abs) 0.0 0.0 - 0.1 x10E3/uL  TSH+T4F+T3Free     Status: None   Collection Time: 03/06/24  3:19 PM  Result Value Ref Range   TSH 0.925 0.450 - 4.500 uIU/mL   T3, Free 2.8 2.0 - 4.4 pg/mL   Free T4 1.06 0.82 - 1.77 ng/dL  Iron, TIBC and Ferritin Panel     Status: None   Collection Time: 03/06/24  3:19 PM  Result Value Ref Range   Total Iron Binding Capacity 329 250 - 450 ug/dL   UIBC 743 868 - 574 ug/dL   Iron 73 27 - 840 ug/dL   Iron Saturation 22 15 - 55 %   Ferritin 25 15 - 150 ng/mL  CA 125     Status: None   Collection Time: 03/06/24  3:19 PM  Result Value Ref Range   Cancer Antigen (CA) 125 28.4 0.0 - 38.1 U/mL    Comment: Roche [employer] Electrochemiluminescence Immunoassay (ECLIA) Values obtained with different assay methods or kits cannot be used interchangeably.  Results cannot be interpreted as absolute evidence of the presence or absence of malignant disease.   CEA     Status: None   Collection Time: 03/06/24  3:19 PM  Result Value Ref Range   CEA 1.4 0.0 - 4.7 ng/mL    Comment:                              Nonsmokers          <3.9                              Smokers             <5.6 [employer] Electrochemiluminescence Immunoassay (ECLIA) Values obtained with different  assay methods or kits cannot be used interchangeably.  Results cannot be interpreted as absolute evidence of the presence or absence of malignant disease.        Assessment & Plan Cerebellar lesion Due for MRI repeat. Sending order for this.  Chronic pain of right knee Patient stable.  Well controlled with current therapy.   Continue current meds.   Thyroid  nodule Patient stable.  Well controlled with current therapy.   Continue current meds.   Mixed hyperlipidemia Checking labs today.  Continue current  therapy for lipid control. Will modify as needed based on labwork results.   -CMP w/eGFR -Lipid Panel  Prediabetes A1C Continues to be in prediabetic ranges.  Will reassess at follow up after next lab check.  Patient counseled on dietary choices and verbalized understanding.   -CBC w/Diff -CMP w/eGFR -Hemoglobin A1C  Vitamin D  deficiency, unspecified B12 deficiency due to diet Other fatigue Checking labs today.  Will continue supplements as needed.   - Vitamin D  - Vitamin B12 - TSH  Family history of cancer Checking labs today.  Will call pt with results when they are available.    Return in about 1 month (around 04/06/2024).   Total time spent: 30 minutes  ALAN CHRISTELLA ARRANT, FNP  03/06/2024   This document may have been prepared by [facility] Voice Recognition software and as such may include unintentional dictation errors.

## 2024-03-07 LAB — CEA: CEA: 1.4 ng/mL (ref 0.0–4.7)

## 2024-03-07 LAB — CBC WITH DIFFERENTIAL/PLATELET
Basophils Absolute: 0.1 10*3/uL (ref 0.0–0.2)
Basos: 1 %
EOS (ABSOLUTE): 0.5 10*3/uL — ABNORMAL HIGH (ref 0.0–0.4)
Eos: 6 %
Hematocrit: 36 % (ref 34.0–46.6)
Hemoglobin: 12 g/dL (ref 11.1–15.9)
Immature Grans (Abs): 0 10*3/uL (ref 0.0–0.1)
Immature Granulocytes: 0 %
Lymphocytes Absolute: 2.6 10*3/uL (ref 0.7–3.1)
Lymphs: 33 %
MCH: 32.2 pg (ref 26.6–33.0)
MCHC: 33.3 g/dL (ref 31.5–35.7)
MCV: 97 fL (ref 79–97)
Monocytes Absolute: 0.5 10*3/uL (ref 0.1–0.9)
Monocytes: 7 %
Neutrophils Absolute: 4.1 10*3/uL (ref 1.4–7.0)
Neutrophils: 53 %
Platelets: 269 10*3/uL (ref 150–450)
RBC: 3.73 x10E6/uL — ABNORMAL LOW (ref 3.77–5.28)
RDW: 12.7 % (ref 11.7–15.4)
WBC: 7.7 10*3/uL (ref 3.4–10.8)

## 2024-03-07 LAB — IRON,TIBC AND FERRITIN PANEL
Ferritin: 25 ng/mL (ref 15–150)
Iron Saturation: 22 % (ref 15–55)
Iron: 73 ug/dL (ref 27–159)
Total Iron Binding Capacity: 329 ug/dL (ref 250–450)
UIBC: 256 ug/dL (ref 131–425)

## 2024-03-07 LAB — TSH+T4F+T3FREE
Free T4: 1.06 ng/dL (ref 0.82–1.77)
T3, Free: 2.8 pg/mL (ref 2.0–4.4)
TSH: 0.925 u[IU]/mL (ref 0.450–4.500)

## 2024-03-07 LAB — VITAMIN B12: Vitamin B-12: 541 pg/mL (ref 232–1245)

## 2024-03-07 LAB — CMP14+EGFR
ALT: 10 IU/L (ref 0–32)
AST: 11 IU/L (ref 0–40)
Albumin: 4 g/dL (ref 3.9–4.9)
Alkaline Phosphatase: 55 IU/L (ref 44–121)
BUN/Creatinine Ratio: 14 (ref 9–23)
BUN: 10 mg/dL (ref 6–24)
Bilirubin Total: <0.2 mg/dL (ref 0.0–1.2)
CO2: 26 mmol/L (ref 20–29)
Calcium: 9.8 mg/dL (ref 8.7–10.2)
Chloride: 100 mmol/L (ref 96–106)
Creatinine, Ser: 0.69 mg/dL (ref 0.57–1.00)
Globulin, Total: 2.8 g/dL (ref 1.5–4.5)
Glucose: 93 mg/dL (ref 70–99)
Potassium: 4.2 mmol/L (ref 3.5–5.2)
Sodium: 138 mmol/L (ref 134–144)
Total Protein: 6.8 g/dL (ref 6.0–8.5)
eGFR: 107 mL/min/{1.73_m2} (ref >59)

## 2024-03-07 LAB — LIPID PANEL
Chol/HDL Ratio: 3.5 ratio (ref 0.0–4.4)
Cholesterol, Total: 191 mg/dL (ref 100–199)
HDL: 54 mg/dL (ref >39)
LDL Chol Calc (NIH): 113 mg/dL — ABNORMAL HIGH (ref 0–99)
Triglycerides: 134 mg/dL (ref 0–149)
VLDL Cholesterol Cal: 24 mg/dL (ref 5–40)

## 2024-03-07 LAB — HEMOGLOBIN A1C
Est. average glucose Bld gHb Est-mCnc: 105 mg/dL
Hgb A1c MFr Bld: 5.3 % (ref 4.8–5.6)

## 2024-03-07 LAB — CA 125: Cancer Antigen (CA) 125: 28.4 U/mL (ref 0.0–38.1)

## 2024-03-07 LAB — VITAMIN D 25 HYDROXY (VIT D DEFICIENCY, FRACTURES): Vit D, 25-Hydroxy: 33.9 ng/mL (ref 30.0–100.0)

## 2024-03-08 ENCOUNTER — Encounter: Payer: Self-pay | Admitting: Family

## 2024-03-18 ENCOUNTER — Other Ambulatory Visit: Payer: Self-pay | Admitting: Family

## 2024-03-18 ENCOUNTER — Other Ambulatory Visit

## 2024-03-18 DIAGNOSIS — F411 Generalized anxiety disorder: Secondary | ICD-10-CM

## 2024-03-26 ENCOUNTER — Encounter: Payer: Self-pay | Admitting: Family

## 2024-03-29 ENCOUNTER — Inpatient Hospital Stay: Admission: RE | Admit: 2024-03-29 | Source: Ambulatory Visit

## 2024-03-31 ENCOUNTER — Encounter: Payer: Self-pay | Admitting: Family

## 2024-03-31 NOTE — Assessment & Plan Note (Signed)
 Patient stable.  Well controlled with current therapy.   Continue current meds.

## 2024-04-02 ENCOUNTER — Encounter: Payer: Self-pay | Admitting: Family

## 2024-04-08 ENCOUNTER — Ambulatory Visit: Admitting: Family

## 2024-04-08 ENCOUNTER — Other Ambulatory Visit: Payer: Self-pay

## 2024-04-08 ENCOUNTER — Other Ambulatory Visit: Payer: Self-pay | Admitting: Family

## 2024-04-08 MED ORDER — SUMATRIPTAN SUCCINATE 100 MG PO TABS: 100.0000 mg | ORAL_TABLET | Freq: Once | ORAL | 2 refills | Status: AC | PRN

## 2024-04-08 NOTE — Telephone Encounter (Signed)
 Pended rx to be printed and faxed to [facility]

## 2024-04-25 ENCOUNTER — Ambulatory Visit: Admitting: Cardiovascular Disease

## 2024-04-25 ENCOUNTER — Telehealth: Payer: Self-pay

## 2024-04-25 NOTE — Telephone Encounter (Signed)
 Sandra Aguilar with [facility] in [place] called asking if youd be okay with the Dr there prescribing  Doxepin or Prazocin or a combo of both to help with sleep and night terrors, they are requesting something that is not a narcotic or stimulant   Please advise   [phone] ext [phone]

## 2024-04-26 MED ORDER — PRAZOSIN HCL 1 MG PO CAPS: 1.0000 mg | ORAL_CAPSULE | Freq: Every day | ORAL | 0 refills | Status: DC

## 2024-04-26 NOTE — Telephone Encounter (Signed)
 Per PCP whatever they think at the rehab is fine  with her, [facility] informed via Jessie since they have called multiple times today

## 2024-04-29 ENCOUNTER — Other Ambulatory Visit: Payer: Self-pay | Admitting: Cardiovascular Disease

## 2024-04-29 DIAGNOSIS — R42 Dizziness and giddiness: Secondary | ICD-10-CM

## 2024-04-29 DIAGNOSIS — R002 Palpitations: Secondary | ICD-10-CM

## 2024-04-29 DIAGNOSIS — R55 Syncope and collapse: Secondary | ICD-10-CM

## 2024-04-29 DIAGNOSIS — I471 Supraventricular tachycardia, unspecified: Secondary | ICD-10-CM

## 2024-05-06 ENCOUNTER — Encounter: Payer: Self-pay | Admitting: Family

## 2024-05-08 ENCOUNTER — Encounter: Payer: Self-pay | Admitting: Family

## 2024-05-08 ENCOUNTER — Ambulatory Visit: Admitting: Family

## 2024-05-08 MED ORDER — BELSOMRA 15 MG PO TABS
15.0000 mg | ORAL_TABLET | Freq: Every evening | ORAL | 1 refills | Status: DC | PRN
Start: 2024-05-08 — End: 2024-07-16

## 2024-05-08 NOTE — Progress Notes (Unsigned)
 Established Patient Office Visit  Subjective:  Patient ID: Sandra Aguilar, female    DOB: 15-Dec-1975  Age: 48 y.o. MRN: [id number]  Chief Complaint  Patient presents with   Follow-up    Discuss meds      HPI  No other concerns at this time.   Past Medical History:  Diagnosis Date   Cough 07/16/2020   Sandra Aguilar presents via virtual visit with complaints of cough with dark yellow and green mucous, nasal congestion and stuffiness, low grade fevers, sore throat, nausea, vomiting, and recent sick exposure. She had a recent negative covid test last week, and started Augmentin  for a URI after her negative Covid results.  She states she started feeling a little better then started feeling bad again.     Depression    Family history of breast cancer 05/27/2018   Fibromyalgia    Insomnia    Lung nodule    Lung nodule, solitary 09/04/2020   PTSD (post-traumatic stress disorder)    Strep pharyngitis 02/17/2021   Last Assessment & Plan: Formatting of this note might be different from the original. Will not check a rapid strep test for her sore throat due to her husband was positive for strep throat diagnosed yesterday in the same clinic.  Will prescribe Pen-Vee K 500 mg 1 every 12 hours for 10 days.  Be sure to drink plenty of fluids including Gatorade, take Tylenol  or Advil  for pain or fever as needed.  I   [facility])    Suspected COVID-19 virus infection 06/29/2020   Formatting of this note might be different from the original. Sandra Aguilar presents via virtual visit with complaints of cough with dark yellow and green mucous, nasal congestion and stuffiness, low grade fevers, sore throat, nausea, vomiting, and recent sick exposure. She had a recent negative covid test last week, and started Augmentin  for a URI after her negative Covid results.  She states she start   Thyroid  nodule     Past Surgical History:  Procedure Laterality Date   APPENDECTOMY     CHOLECYSTECTOMY     LEFT HEART CATH  AND CORONARY ANGIOGRAPHY Left 02/20/2024   Procedure: LEFT HEART CATH AND CORONARY ANGIOGRAPHY;  Surgeon: Fernand Denyse LABOR, MD;  Location: [facility] INVASIVE CV LAB;  Service: Cardiovascular;  Laterality: Left;    Social History   Socioeconomic History   Marital status: Married    Spouse name: Not on file   Number of children: Not on file   Years of education: Not on file   Highest education level: Not on file  Occupational History   Not on file  Tobacco Use   Smoking status: Never    Passive exposure: Never   Smokeless tobacco: Never  Vaping Use   Vaping status: Never Used  Substance and Sexual Activity   Alcohol use: Not Currently    Comment: ocassionally   Drug use: Never   Sexual activity: Yes    Birth control/protection: None, Other-see comments    Comment: husband has vasectomy  Other Topics Concern   Not on file  Social History Narrative   Not on file   Social Drivers of Health   Financial Resource Strain: Not on file  Food Insecurity: Not on file  Transportation Needs: Not on file  Physical Activity: Not on file  Stress: Not on file  Social Connections: Unknown (05/08/2020)   Received from [facility]   Social Connections    Frequency of Communication with Friends and Family: Not  asked    Frequency of Social Gatherings with Friends and Family: Not asked  Intimate Partner Violence: Unknown (05/08/2020)   Received from [facility]   Intimate Partner Violence    Fear of Current or Ex-Partner: Not asked    Emotionally Abused: Not asked    Physically Abused: Not asked    Sexually Abused: Not asked    Family History  Problem Relation Age of Onset   Heart attack Father    Vascular Disease Father    Vascular Disease Maternal Uncle    Obesity Paternal Aunt    Vascular Disease Paternal Aunt    Heart attack Maternal Grandmother    Ovarian cancer Maternal Grandmother    Vascular Disease Maternal Grandfather    Stroke Paternal Grandmother     Allergies   Allergen Reactions   Ciprofloxacin Nausea And Vomiting and Other (See Comments)    Neuropathy  Neuropathy  Neuropathy  Neuropathy  Neuropathy   Nitrofurantoin Hives    Other reaction(s): Hives/Swelling-Allergy   Sulfa Antibiotics Nausea And Vomiting, Other (See Comments) and Rash    CAUSES PAIN, PT REPORTS    Review of Systems  All other systems reviewed and are negative.      Objective:   BP 128/89   Pulse 94   Ht 5' 5 (1.651 m)   Wt 152 lb (68.9 kg)   SpO2 97%   BMI 25.29 kg/m   Vitals:   05/08/24 1413  BP: 128/89  Pulse: 94  Height: 5' 5 (1.651 m)  Weight: 152 lb (68.9 kg)  SpO2: 97%  BMI (Calculated): 25.29    Physical Exam Vitals and nursing note reviewed.  Constitutional:      Appearance: Normal appearance. She is normal weight.  HENT:     Head: Normocephalic.  Eyes:     Extraocular Movements: Extraocular movements intact.     Conjunctiva/sclera: Conjunctivae normal.     Pupils: Pupils are equal, round, and reactive to light.  Cardiovascular:     Rate and Rhythm: Normal rate.  Pulmonary:     Effort: Pulmonary effort is normal.  Neurological:     General: No focal deficit present.     Mental Status: She is alert and oriented to person, place, and time. Mental status is at baseline.  Psychiatric:        Mood and Affect: Mood normal.        Behavior: Behavior normal.        Thought Content: Thought content normal.      No results found for any visits on 05/08/24.  Recent Results (from the past 2160 hours)  CBC     Status: Abnormal   Collection Time: 02/11/24  9:35 AM  Result Value Ref Range   WBC 8.1 4.0 - 10.5 K/uL   RBC 3.84 (L) 3.87 - 5.11 MIL/uL   Hemoglobin 12.1 12.0 - 15.0 g/dL   HCT 64.2 (L) 63.9 - 53.9 %   MCV 93.0 80.0 - 100.0 fL   MCH 31.5 26.0 - 34.0 pg   MCHC 33.9 30.0 - 36.0 g/dL   RDW 87.4 88.4 - 84.4 %   Platelets 240 150 - 400 K/uL   nRBC 0.0 0.0 - 0.2 %    Comment: Performed at [facility], [address] [place], [place] [address]  Troponin I (High Sensitivity)     Status: None   Collection Time: 02/11/24  9:35 AM  Result Value Ref Range   Troponin I (High Sensitivity) <2 <18  ng/L    Comment: (NOTE) Elevated high sensitivity troponin I (hsTnI) values and significant  changes across serial measurements may suggest ACS but many other  chronic and acute conditions are known to elevate hsTnI results.  Refer to the Links section for chest pain algorithms and additional  guidance. Performed at [facility], [address] [place], [place] [address]   Comprehensive metabolic panel with GFR     Status: Abnormal   Collection Time: 02/11/24  9:35 AM  Result Value Ref Range   Sodium 135 135 - 145 mmol/L   Potassium 3.9 3.5 - 5.1 mmol/L   Chloride 101 98 - 111 mmol/L   CO2 24 22 - 32 mmol/L   Glucose, Bld 106 (H) 70 - 99 mg/dL    Comment: Glucose reference range applies only to samples taken after fasting for at least 8 hours.   BUN 11 6 - 20 mg/dL   Creatinine, Ser 9.31 0.44 - 1.00 mg/dL   Calcium 9.5 8.9 - 89.6 mg/dL   Total Protein 7.5 6.5 - 8.1 g/dL   Albumin 3.8 3.5 - 5.0 g/dL   AST 16 15 - 41 U/L   ALT 19 0 - 44 U/L   Alkaline Phosphatase 47 38 - 126 U/L   Total Bilirubin 0.5 0.0 - 1.2 mg/dL   GFR, Estimated >39 >39 mL/min    Comment: (NOTE) Calculated using the CKD-EPI Creatinine Equation (2021)    Anion gap 10 5 - 15    Comment: Performed at [facility], [address] [place], [place] [address]  Lipase, blood     Status: None   Collection Time: 02/11/24  9:35 AM  Result Value Ref Range   Lipase 29 11 - 51 U/L    Comment: Performed at [facility], [address] [place], [place] [address]  D-dimer, quantitative     Status: Abnormal   Collection Time: 02/11/24  9:50 AM  Result Value Ref Range   D-Dimer, Quant 0.52 (H) 0.00 - 0.50 ug/mL-FEU    Comment: (NOTE) At the manufacturer cut-off value of 0.5 g/mL FEU, this assay has  a negative predictive value of 95-100%.This assay is intended for use in conjunction with a clinical pretest probability (PTP) assessment model to exclude pulmonary embolism (PE) and deep venous thrombosis (DVT) in outpatients suspected of PE or DVT. Results should be correlated with clinical presentation. Performed at [facility], [address] [place], [place] [address]   TSH     Status: None   Collection Time: 02/11/24 10:00 AM  Result Value Ref Range   TSH 0.950 0.350 - 4.500 uIU/mL    Comment: Performed by a 3rd Generation assay with a functional sensitivity of <=0.01 uIU/mL. Performed at [facility], [address] [place], [place] [address]   T4, free     Status: None   Collection Time: 02/11/24 10:00 AM  Result Value Ref Range   Free T4 0.67 0.61 - 1.12 ng/dL    Comment: (NOTE) Biotin ingestion may interfere with free T4 tests. If the results are inconsistent with the TSH level, previous test results, or the clinical presentation, then consider biotin interference. If needed, order repeat testing after stopping biotin. Performed at [facility], [address] [place], [place] [address]   POC urine preg, ED     Status: None   Collection Time: 02/11/24 10:45 AM  Result Value Ref Range   Preg Test, Ur Negative Negative  Urinalysis, Routine w  reflex microscopic -Urine, Clean Catch     Status: Abnormal   Collection Time: 02/11/24 12:00 PM  Result Value Ref Range   Color, Urine COLORLESS (A) YELLOW   APPearance CLEAR (A) CLEAR   Specific Gravity, Urine 1.016 1.005 - 1.030   pH 7.0 5.0 - 8.0   Glucose, UA NEGATIVE NEGATIVE mg/dL   Hgb urine dipstick NEGATIVE NEGATIVE   Bilirubin Urine NEGATIVE NEGATIVE   Ketones, ur NEGATIVE NEGATIVE mg/dL   Protein, ur NEGATIVE NEGATIVE mg/dL   Nitrite NEGATIVE NEGATIVE   Leukocytes,Ua NEGATIVE NEGATIVE    Comment: Performed at [facility], [address] [place], [place] [address]   Troponin I (High Sensitivity)     Status: None   Collection Time: 02/11/24 12:00 PM  Result Value Ref Range   Troponin I (High Sensitivity) 3 <18 ng/L    Comment: (NOTE) Elevated high sensitivity troponin I (hsTnI) values and significant  changes across serial measurements may suggest ACS but many other  chronic and acute conditions are known to elevate hsTnI results.  Refer to the Links section for chest pain algorithms and additional  guidance. Performed at [facility], [address] [place], [place] [address]   Lipid panel     Status: Abnormal   Collection Time: 03/06/24  3:19 PM  Result Value Ref Range   Cholesterol, Total 191 100 - 199 mg/dL   Triglycerides 865 0 - 149 mg/dL   HDL 54 >60 mg/dL   VLDL Cholesterol Cal 24 5 - 40 mg/dL   LDL Chol Calc (NIH) 886 (H) 0 - 99 mg/dL   Chol/HDL Ratio 3.5 0.0 - 4.4 ratio    Comment:                                   T. Chol/HDL Ratio                                             Men  Women                               1/2 Avg.Risk  3.4    3.3                                   Avg.Risk  5.0    4.4                                2X Avg.Risk  9.6    7.1                                3X Avg.Risk 23.4   11.0   VITAMIN D  25 Hydroxy (Vit-D Deficiency, Fractures)     Status: None   Collection Time: 03/06/24  3:19 PM  Result Value Ref Range   Vit D, 25-Hydroxy 33.9 30.0 - 100.0 ng/mL    Comment: Vitamin D  deficiency has been defined by the Institute of Medicine and an Endocrine Society practice guideline as a level of serum 25-OH vitamin D  less than 20 ng/mL (1,2). The  Endocrine Society went on to further define vitamin D  insufficiency as a level between 21 and 29 ng/mL (2). 1. IOM (Institute of Medicine). 2010. Dietary reference    intakes for calcium and D. [place]: The    [employer]. 2. Holick MF, Binkley [place], Bischoff-Ferrari HA, et al.    Evaluation, treatment, and prevention of vitamin D      deficiency: an Endocrine Society clinical practice    guideline. JCEM. 2011 Jul; 96(7):1911-30.   CMP14+EGFR     Status: None   Collection Time: 03/06/24  3:19 PM  Result Value Ref Range   Glucose 93 70 - 99 mg/dL   BUN 10 6 - 24 mg/dL   Creatinine, Ser 9.30 0.57 - 1.00 mg/dL   eGFR 892 >40 fO/fpw/8.26   BUN/Creatinine Ratio 14 9 - 23   Sodium 138 134 - 144 mmol/L   Potassium 4.2 3.5 - 5.2 mmol/L   Chloride 100 96 - 106 mmol/L   CO2 26 20 - 29 mmol/L   Calcium 9.8 8.7 - 10.2 mg/dL   Total Protein 6.8 6.0 - 8.5 g/dL   Albumin 4.0 3.9 - 4.9 g/dL   Globulin, Total 2.8 1.5 - 4.5 g/dL   Bilirubin Total <9.7 0.0 - 1.2 mg/dL   Alkaline Phosphatase 55 44 - 121 IU/L   AST 11 0 - 40 IU/L   ALT 10 0 - 32 IU/L  Hemoglobin A1c     Status: None   Collection Time: 03/06/24  3:19 PM  Result Value Ref Range   Hgb A1c MFr Bld 5.3 4.8 - 5.6 %    Comment:          Prediabetes: 5.7 - 6.4          Diabetes: >6.4          Glycemic control for adults with diabetes: <7.0    Est. average glucose Bld gHb Est-mCnc 105 mg/dL  Vitamin A87     Status: None   Collection Time: 03/06/24  3:19 PM  Result Value Ref Range   Vitamin B-12 541 232 - 1,245 pg/mL  CBC with Diff     Status: Abnormal   Collection Time: 03/06/24  3:19 PM  Result Value Ref Range   WBC 7.7 3.4 - 10.8 x10E3/uL   RBC 3.73 (L) 3.77 - 5.28 x10E6/uL   Hemoglobin 12.0 11.1 - 15.9 g/dL   Hematocrit 63.9 65.9 - 46.6 %   MCV 97 79 - 97 fL   MCH 32.2 26.6 - 33.0 pg   MCHC 33.3 31.5 - 35.7 g/dL   RDW 87.2 88.2 - 84.5 %   Platelets 269 150 - 450 x10E3/uL   Neutrophils 53 Not Estab. %   Lymphs 33 Not Estab. %   Monocytes 7 Not Estab. %   Eos 6 Not Estab. %   Basos 1 Not Estab. %   Neutrophils Absolute 4.1 1.4 - 7.0 x10E3/uL   Lymphocytes Absolute 2.6 0.7 - 3.1 x10E3/uL   Monocytes Absolute 0.5 0.1 - 0.9 x10E3/uL   EOS (ABSOLUTE) 0.5 (H) 0.0 - 0.4 x10E3/uL   Basophils Absolute 0.1 0.0 - 0.2 x10E3/uL   Immature Granulocytes 0 Not Estab.  %   Immature Grans (Abs) 0.0 0.0 - 0.1 x10E3/uL  TSH+T4F+T3Free     Status: None   Collection Time: 03/06/24  3:19 PM  Result Value Ref Range   TSH 0.925 0.450 - 4.500 uIU/mL   T3, Free 2.8 2.0 - 4.4 pg/mL   Free T4 1.06  0.82 - 1.77 ng/dL  Iron, TIBC and Ferritin Panel     Status: None   Collection Time: 03/06/24  3:19 PM  Result Value Ref Range   Total Iron Binding Capacity 329 250 - 450 ug/dL   UIBC 743 868 - 574 ug/dL   Iron 73 27 - 840 ug/dL   Iron Saturation 22 15 - 55 %   Ferritin 25 15 - 150 ng/mL  CA 125     Status: None   Collection Time: 03/06/24  3:19 PM  Result Value Ref Range   Cancer Antigen (CA) 125 28.4 0.0 - 38.1 U/mL    Comment: [employer] Electrochemiluminescence Immunoassay (ECLIA) Values obtained with different assay methods or kits cannot be used interchangeably.  Results cannot be interpreted as absolute evidence of the presence or absence of malignant disease.   CEA     Status: None   Collection Time: 03/06/24  3:19 PM  Result Value Ref Range   CEA 1.4 0.0 - 4.7 ng/mL    Comment:                              Nonsmokers          <3.9                              Smokers             <5.6 [employer] Electrochemiluminescence Immunoassay (ECLIA) Values obtained with different assay methods or kits cannot be used interchangeably.  Results cannot be interpreted as absolute evidence of the presence or absence of malignant disease.        Assessment & Plan:   Assessment & Plan     No follow-ups on file.   Total time spent: {AMA time spent:29001} minutes  ALAN CHRISTELLA ARRANT, FNP  05/08/2024   This document may have been prepared by [facility] Voice Recognition software and as such may include unintentional dictation errors.

## 2024-05-19 ENCOUNTER — Encounter: Payer: Self-pay | Admitting: Family

## 2024-05-19 NOTE — Assessment & Plan Note (Signed)
 Patient stable.  Well controlled with current therapy.   Continue current meds.

## 2024-05-19 NOTE — Assessment & Plan Note (Signed)
 Due for MRI repeat. Sending order for this.

## 2024-05-20 ENCOUNTER — Encounter: Payer: Self-pay | Admitting: Cardiovascular Disease

## 2024-05-20 ENCOUNTER — Ambulatory Visit (INDEPENDENT_AMBULATORY_CARE_PROVIDER_SITE_OTHER): Admitting: Cardiovascular Disease

## 2024-05-20 VITALS — BP 118/78 | HR 94 | Ht 65.0 in | Wt 156.8 lb

## 2024-05-20 DIAGNOSIS — R002 Palpitations: Secondary | ICD-10-CM

## 2024-05-20 DIAGNOSIS — I200 Unstable angina: Secondary | ICD-10-CM

## 2024-05-20 DIAGNOSIS — R0789 Other chest pain: Secondary | ICD-10-CM

## 2024-05-20 DIAGNOSIS — I951 Orthostatic hypotension: Secondary | ICD-10-CM | POA: Diagnosis not present

## 2024-05-20 DIAGNOSIS — I471 Supraventricular tachycardia, unspecified: Secondary | ICD-10-CM | POA: Diagnosis not present

## 2024-05-20 MED ORDER — METOPROLOL SUCCINATE ER 25 MG PO TB24: 25.0000 mg | ORAL_TABLET | Freq: Every day | ORAL | 11 refills | Status: AC

## 2024-05-20 MED ORDER — PANTOPRAZOLE SODIUM 40 MG PO TBEC: 40.0000 mg | DELAYED_RELEASE_TABLET | Freq: Every day | ORAL | 11 refills | Status: DC

## 2024-05-20 MED ORDER — SUCRALFATE 1 G PO TABS: 1.0000 g | ORAL_TABLET | Freq: Four times a day (QID) | ORAL | 1 refills | Status: DC

## 2024-05-20 NOTE — Progress Notes (Signed)
 Cardiology Office Note   Date:  05/20/2024   ID:  Sandra Aguilar, DOB 1976-07-15, MRN [id number]  PCP:  Orlean Alan HERO, FNP  Cardiologist:  Denyse Bathe, MD      History of Present Illness: Sandra Aguilar is a 48 y.o. female who presents for  Chief Complaint  Patient presents with   Acute Visit    Has pressure in chest but its right side.      Past Medical History:  Diagnosis Date   Cough 07/16/2020   [name] presents via virtual visit with complaints of cough with dark yellow and green mucous, nasal congestion and stuffiness, low grade fevers, sore throat, nausea, vomiting, and recent sick exposure. She had a recent negative covid test last week, and started Augmentin  for a URI after her negative Covid results.  She states she started feeling a little better then started feeling bad again.     Depression    Family history of breast cancer 05/27/2018   Fibromyalgia    Insomnia    Lung nodule    Lung nodule, solitary 09/04/2020   PTSD (post-traumatic stress disorder)    Strep pharyngitis 02/17/2021   Last Assessment & Plan: Formatting of this note might be different from the original. Will not check a rapid strep test for her sore throat due to her husband was positive for strep throat diagnosed yesterday in the same clinic.  Will prescribe Pen-Vee K 500 mg 1 every 12 hours for 10 days.  Be sure to drink plenty of fluids including Gatorade, take Tylenol  or Advil  for pain or fever as needed.  I   Stroke [facility])    Suspected COVID-19 virus infection 06/29/2020   Formatting of this note might be different from the original. [name] presents via virtual visit with complaints of cough with dark yellow and green mucous, nasal congestion and stuffiness, low grade fevers, sore throat, nausea, vomiting, and recent sick exposure. She had a recent negative covid test last week, and started Augmentin  for a URI after her negative Covid results.  She states she start   Thyroid  nodule       Past Surgical History:  Procedure Laterality Date   APPENDECTOMY     CHOLECYSTECTOMY     LEFT HEART CATH AND CORONARY ANGIOGRAPHY Left 02/20/2024   Procedure: LEFT HEART CATH AND CORONARY ANGIOGRAPHY;  Surgeon: Bathe Denyse LABOR, MD;  Location: [facility] INVASIVE CV LAB;  Service: Cardiovascular;  Laterality: Left;     Current Outpatient Medications  Medication Sig Dispense Refill   albuterol  (VENTOLIN  HFA) 108 (90 Base) MCG/ACT inhaler Inhale 1-2 puffs into the lungs every 6 (six) hours as needed for wheezing or shortness of breath. 18 each 3   amphetamine -dextroamphetamine  (ADDERALL) 20 MG tablet Take 1 tablet (20 mg total) by mouth 2 (two) times daily. 60 tablet 0   CYMBALTA 60 MG capsule Take 60 mg by mouth every morning.     fluticasone  (FLONASE ) 50 MCG/ACT nasal spray Place 2 sprays into both nostrils daily. 16 g 0   HYDROcodone -acetaminophen  (NORCO) 10-325 MG tablet Take 1 tablet by mouth every 8 (eight) hours as needed for severe pain (pain score 7-10). 90 tablet 0   hydrOXYzine  (ATARAX ) 25 MG tablet TAKE 1 TABLET (25 MG TOTAL) BY MOUTH 3 TIMES A DAY AS NEEDED FOR ITCHING OR ANXIETY 270 tablet 1   hyoscyamine  (LEVSIN  SL) 0.125 MG SL tablet Place 1 tablet (0.125 mg total) under the tongue every 4 (four) hours as needed.  30 tablet 0   ibuprofen  (ADVIL ) 800 MG tablet Take 1 tablet (800 mg total) by mouth every 8 (eight) hours as needed. 30 tablet 0   metoprolol  succinate (TOPROL  XL) 25 MG 24 hr tablet Take 1 tablet (25 mg total) by mouth daily. 30 tablet 11   ondansetron  (ZOFRAN -ODT) 8 MG disintegrating tablet Take 1 tablet (8 mg total) by mouth every 8 (eight) hours as needed for nausea or vomiting. 20 tablet 0   pantoprazole  (PROTONIX ) 40 MG tablet Take 1 tablet (40 mg total) by mouth daily. 30 tablet 11   prazosin  (MINIPRESS ) 1 MG capsule Take 1 capsule (1 mg total) by mouth at bedtime. 30 capsule 0   sucralfate  (CARAFATE ) 1 g tablet Take 1 tablet (1 g total) by mouth 4 (four) times  daily. 120 tablet 1   SUMAtriptan  (IMITREX ) 100 MG tablet Take 1 tablet (100 mg total) by mouth once as needed for migraine. May repeat in 2 hours if headache persists or recurs. 30 tablet 2   Suvorexant  (BELSOMRA ) 15 MG TABS Take 1 tablet (15 mg total) by mouth at bedtime as needed. 30 tablet 1   tiZANidine  (ZANAFLEX ) 4 MG tablet Take 1 tablet (4 mg total) by mouth 3 (three) times daily. 90 tablet 3   traZODone  (DESYREL ) 100 MG tablet Take 100 mg by mouth at bedtime.     No current facility-administered medications for this visit.    Allergies:   Ciprofloxacin, Nitrofurantoin, and Sulfa antibiotics    Social History:   reports that she has never smoked. She has never been exposed to tobacco smoke. She has never used smokeless tobacco. She reports that she does not currently use alcohol. She reports that she does not use drugs.   Family History:  family history includes Heart attack in her father and maternal grandmother; Obesity in her paternal aunt; Ovarian cancer in her maternal grandmother; Stroke in her paternal grandmother; Vascular Disease in her father, maternal grandfather, maternal uncle, and paternal aunt.    ROS:     Review of Systems  Constitutional: Negative.   HENT: Negative.    Eyes: Negative.   Respiratory: Negative.    Gastrointestinal: Negative.   Genitourinary: Negative.   Musculoskeletal: Negative.   Skin: Negative.   Neurological: Negative.   Endo/Heme/Allergies: Negative.   Psychiatric/Behavioral: Negative.    All other systems reviewed and are negative.     All other systems are reviewed and negative.    PHYSICAL EXAM: VS:  BP 118/78   Pulse 94   Ht 5' 5 (1.651 m)   Wt 156 lb 12.8 oz (71.1 kg)   SpO2 95%   BMI 26.09 kg/m  , BMI Body mass index is 26.09 kg/m. Last weight:  Wt Readings from Last 3 Encounters:  05/20/24 156 lb 12.8 oz (71.1 kg)  05/08/24 152 lb (68.9 kg)  03/06/24 144 lb (65.3 kg)     Physical Exam Constitutional:       Appearance: Normal appearance.  Cardiovascular:     Rate and Rhythm: Normal rate and regular rhythm.     Heart sounds: Normal heart sounds.  Pulmonary:     Effort: Pulmonary effort is normal.     Breath sounds: Normal breath sounds.  Musculoskeletal:     Right lower leg: No edema.     Left lower leg: No edema.  Neurological:     Mental Status: She is alert.       EKG: NSR 76/min non specific st changes  Recent  Labs: 03/06/2024: ALT 10; BUN 10; Creatinine, Ser 0.69; Hemoglobin 12.0; Platelets 269; Potassium 4.2; Sodium 138; TSH 0.925    Lipid Panel    Component Value Date/Time   CHOL 191 03/06/2024 1519   TRIG 134 03/06/2024 1519   HDL 54 03/06/2024 1519   CHOLHDL 3.5 03/06/2024 1519   LDLCALC 113 (H) 03/06/2024 1519      Other studies Reviewed: Additional studies/ records that were reviewed today include:  Review of the above records demonstrates:       No data to display            ASSESSMENT AND PLAN:    ICD-10-CM   1. Chest pain, non-cardiac  R07.89 pantoprazole  (PROTONIX ) 40 MG tablet    metoprolol  succinate (TOPROL  XL) 25 MG 24 hr tablet    sucralfate  (CARAFATE ) 1 g tablet   cardiac cath 3/25 was normal with normal coronaries. GERD, and SVT can cause symptoms. Add metoprolol , and protonix . EKG normal.    2. SVT (supraventricular tachycardia) (HCC)  I47.10 pantoprazole  (PROTONIX ) 40 MG tablet    metoprolol  succinate (TOPROL  XL) 25 MG 24 hr tablet    sucralfate  (CARAFATE ) 1 g tablet    3. Orthostatic hypotension  I95.1 pantoprazole  (PROTONIX ) 40 MG tablet    metoprolol  succinate (TOPROL  XL) 25 MG 24 hr tablet    sucralfate  (CARAFATE ) 1 g tablet    4. Unstable angina (HCC)  I20.0 pantoprazole  (PROTONIX ) 40 MG tablet    metoprolol  succinate (TOPROL  XL) 25 MG 24 hr tablet    sucralfate  (CARAFATE ) 1 g tablet    5. Palpitations  R00.2 pantoprazole  (PROTONIX ) 40 MG tablet    metoprolol  succinate (TOPROL  XL) 25 MG 24 hr tablet    sucralfate  (CARAFATE ) 1 g  tablet       Problem List Items Addressed This Visit       Cardiovascular and Mediastinum   Orthostatic hypotension   Relevant Medications   pantoprazole  (PROTONIX ) 40 MG tablet   metoprolol  succinate (TOPROL  XL) 25 MG 24 hr tablet   sucralfate  (CARAFATE ) 1 g tablet   SVT (supraventricular tachycardia) (HCC)   Relevant Medications   pantoprazole  (PROTONIX ) 40 MG tablet   metoprolol  succinate (TOPROL  XL) 25 MG 24 hr tablet   sucralfate  (CARAFATE ) 1 g tablet   Unstable angina (HCC)   Relevant Medications   pantoprazole  (PROTONIX ) 40 MG tablet   metoprolol  succinate (TOPROL  XL) 25 MG 24 hr tablet   sucralfate  (CARAFATE ) 1 g tablet     Other   Palpitations   Relevant Medications   pantoprazole  (PROTONIX ) 40 MG tablet   metoprolol  succinate (TOPROL  XL) 25 MG 24 hr tablet   sucralfate  (CARAFATE ) 1 g tablet   Other Visit Diagnoses       Chest pain, non-cardiac    -  Primary   cardiac cath 3/25 was normal with normal coronaries. GERD, and SVT can cause symptoms. Add metoprolol , and protonix . EKG normal.   Relevant Medications   pantoprazole  (PROTONIX ) 40 MG tablet   metoprolol  succinate (TOPROL  XL) 25 MG 24 hr tablet   sucralfate  (CARAFATE ) 1 g tablet          Disposition:   No follow-ups on file.    Total time spent: 30 minutes  Signed,  Denyse Bathe, MD  05/20/2024 2:00 PM    [facility]

## 2024-06-05 ENCOUNTER — Ambulatory Visit: Admitting: Family

## 2024-06-13 ENCOUNTER — Telehealth: Payer: Self-pay | Admitting: Diagnostic Neuroimaging

## 2024-06-13 NOTE — Telephone Encounter (Signed)
 LVM and sent mychart msg informing pt of need to reschedule 06/17/24 appt - MD out

## 2024-06-14 ENCOUNTER — Other Ambulatory Visit: Payer: Self-pay | Admitting: Family

## 2024-06-14 DIAGNOSIS — G479 Sleep disorder, unspecified: Secondary | ICD-10-CM

## 2024-06-14 NOTE — Telephone Encounter (Signed)
 Sent pt a message asking for clarification of dose

## 2024-06-17 ENCOUNTER — Ambulatory Visit: Payer: Self-pay | Admitting: Diagnostic Neuroimaging

## 2024-06-21 ENCOUNTER — Ambulatory Visit: Admitting: Cardiovascular Disease

## 2024-06-25 ENCOUNTER — Other Ambulatory Visit: Payer: Self-pay

## 2024-06-25 DIAGNOSIS — G479 Sleep disorder, unspecified: Secondary | ICD-10-CM

## 2024-06-25 MED ORDER — TRAZODONE HCL 100 MG PO TABS
100.0000 mg | ORAL_TABLET | Freq: Every day | ORAL | 1 refills | Status: DC
Start: 2024-06-25 — End: 2024-07-16

## 2024-07-16 ENCOUNTER — Ambulatory Visit (INDEPENDENT_AMBULATORY_CARE_PROVIDER_SITE_OTHER): Admitting: Family

## 2024-07-16 ENCOUNTER — Encounter: Payer: Self-pay | Admitting: Family

## 2024-07-16 VITALS — BP 126/78 | HR 88 | Ht 65.0 in | Wt 165.4 lb

## 2024-07-16 DIAGNOSIS — Z0130 Encounter for examination of blood pressure without abnormal findings: Secondary | ICD-10-CM

## 2024-07-16 DIAGNOSIS — E538 Deficiency of other specified B group vitamins: Secondary | ICD-10-CM

## 2024-07-16 DIAGNOSIS — E559 Vitamin D deficiency, unspecified: Secondary | ICD-10-CM

## 2024-07-16 DIAGNOSIS — E782 Mixed hyperlipidemia: Secondary | ICD-10-CM | POA: Diagnosis not present

## 2024-07-16 DIAGNOSIS — M25561 Pain in right knee: Secondary | ICD-10-CM

## 2024-07-16 DIAGNOSIS — R1084 Generalized abdominal pain: Secondary | ICD-10-CM

## 2024-07-16 DIAGNOSIS — R7303 Prediabetes: Secondary | ICD-10-CM | POA: Diagnosis not present

## 2024-07-16 DIAGNOSIS — G8929 Other chronic pain: Secondary | ICD-10-CM

## 2024-07-16 DIAGNOSIS — F900 Attention-deficit hyperactivity disorder, predominantly inattentive type: Secondary | ICD-10-CM

## 2024-07-16 DIAGNOSIS — G939 Disorder of brain, unspecified: Secondary | ICD-10-CM

## 2024-07-16 DIAGNOSIS — Z79899 Other long term (current) drug therapy: Secondary | ICD-10-CM

## 2024-07-16 DIAGNOSIS — R5383 Other fatigue: Secondary | ICD-10-CM

## 2024-07-16 DIAGNOSIS — R112 Nausea with vomiting, unspecified: Secondary | ICD-10-CM

## 2024-07-16 DIAGNOSIS — E041 Nontoxic single thyroid nodule: Secondary | ICD-10-CM

## 2024-07-16 MED ORDER — HYDROXYZINE PAMOATE 50 MG PO CAPS: 50.0000 mg | ORAL_CAPSULE | Freq: Four times a day (QID) | ORAL | 3 refills | Status: DC | PRN

## 2024-07-16 MED ORDER — VILAZODONE HCL 10 MG PO TABS: 10.0000 mg | ORAL_TABLET | Freq: Every day | ORAL | 0 refills | Status: DC

## 2024-07-16 MED ORDER — VILAZODONE HCL 20 MG PO TABS: 20.0000 mg | ORAL_TABLET | Freq: Every day | ORAL | 1 refills | Status: DC

## 2024-07-16 NOTE — Assessment & Plan Note (Addendum)
 UDS obtained in office today.  Will call with results  Will send refill once we have results in the system.

## 2024-07-16 NOTE — Assessment & Plan Note (Signed)
 Patient stable.  Well controlled with current therapy.   Continue current meds.

## 2024-07-16 NOTE — Progress Notes (Signed)
 Established Patient Office Visit  Subjective:  Patient ID: Sandra Aguilar, female    DOB: December 16, 1975  Age: 48 y.o. MRN: [id number]  Chief Complaint  Patient presents with   Follow-up    Discuss medications    Patient is here today for her 3 months follow up.  She has been feeling fairly well since last appointment.   She does have additional concerns to discuss today.  She asks if we can go back to the viibryd  for her antidepressant.  She is concerned as she has been gaining weight on the Cymbalta.  Also asks if we can restart her adderall.  Has been having nausea and abdominal pain.  She asks if we can set up a referral to GI, as she thinks she might be due for a colonoscopy anyway.  Finally, she has been having some trouble sleeping, says she doesn't want to use the trazodone  because she has still been having some low HR's, especially at night, and the Belsomra  isn't helping.  She says that the Vistaril  they gave her while she was at the rehab facility did help her some, would like to try that again.   Labs are due today.  She needs refills.   I have reviewed her active problem list, medication list, allergies, notes from last encounter, lab results for her appointment today.    No other concerns at this time.   Past Medical History:  Diagnosis Date   Cough 07/16/2020   Sandra Aguilar presents via virtual visit with complaints of cough with dark yellow and green mucous, nasal congestion and stuffiness, low grade fevers, sore throat, nausea, vomiting, and recent sick exposure. She had a recent negative covid test last week, and started Augmentin  for a URI after her negative Covid results.  She states she started feeling a little better then started feeling bad again.     Depression    Family history of breast cancer 05/27/2018   Fibromyalgia    Insomnia    Lung nodule    Lung nodule, solitary 09/04/2020   PTSD (post-traumatic stress disorder)    Strep pharyngitis 02/17/2021    Last Assessment & Plan: Formatting of this note might be different from the original. Will not check a rapid strep test for her sore throat due to her husband was positive for strep throat diagnosed yesterday in the same clinic.  Will prescribe Pen-Vee K 500 mg 1 every 12 hours for 10 days.  Be sure to drink plenty of fluids including Gatorade, take Tylenol  or Advil  for pain or fever as needed.  I   Stroke [facility])    Suspected COVID-19 virus infection 06/29/2020   Formatting of this note might be different from the original. [name] presents via virtual visit with complaints of cough with dark yellow and green mucous, nasal congestion and stuffiness, low grade fevers, sore throat, nausea, vomiting, and recent sick exposure. She had a recent negative covid test last week, and started Augmentin  for a URI after her negative Covid results.  She states she start   Thyroid  nodule     Past Surgical History:  Procedure Laterality Date   APPENDECTOMY     CHOLECYSTECTOMY     LEFT HEART CATH AND CORONARY ANGIOGRAPHY Left 02/20/2024   Procedure: LEFT HEART CATH AND CORONARY ANGIOGRAPHY;  Surgeon: Fernand Denyse LABOR, MD;  Location: [facility] INVASIVE CV LAB;  Service: Cardiovascular;  Laterality: Left;    Social History   Socioeconomic History   Marital status: Married  Spouse name: Not on file   Number of children: Not on file   Years of education: Not on file   Highest education level: Not on file  Occupational History   Not on file  Tobacco Use   Smoking status: Never    Passive exposure: Never   Smokeless tobacco: Never  Vaping Use   Vaping status: Never Used  Substance and Sexual Activity   Alcohol use: Not Currently    Comment: ocassionally   Drug use: Never   Sexual activity: Yes    Birth control/protection: None, Other-see comments    Comment: husband has vasectomy  Other Topics Concern   Not on file  Social History Narrative   Not on file   Social Drivers of Health   Financial  Resource Strain: Not on file  Food Insecurity: Not on file  Transportation Needs: Not on file  Physical Activity: Not on file  Stress: Not on file  Social Connections: Unknown (05/08/2020)   Received from [facility]   Social Connections    Frequency of Communication with Friends and Family: Not asked    Frequency of Social Gatherings with Friends and Family: Not asked  Intimate Partner Violence: Unknown (05/08/2020)   [facility] from [facility]   Intimate Partner Violence    Fear of Current or Ex-Partner: Not asked    Emotionally Abused: Not asked    Physically Abused: Not asked    Sexually Abused: Not asked    Family History  Problem Relation Age of Onset   Heart attack Father    Vascular Disease Father    Vascular Disease Maternal Uncle    Obesity Paternal Aunt    Vascular Disease Paternal Aunt    Heart attack Maternal Grandmother    Ovarian cancer Maternal Grandmother    Vascular Disease Maternal Grandfather    Stroke Paternal Grandmother     Allergies  Allergen Reactions   Ciprofloxacin Nausea And Vomiting and Other (See Comments)    Neuropathy  Neuropathy  Neuropathy  Neuropathy  Neuropathy   Nitrofurantoin Hives    Other reaction(s): Hives/Swelling-Allergy   Sulfa Antibiotics Nausea And Vomiting, Other (See Comments) and Rash    CAUSES PAIN, PT REPORTS    Review of Systems  Constitutional:  Positive for malaise/fatigue.  Musculoskeletal:  Positive for myalgias.  Psychiatric/Behavioral:  Positive for depression. The patient has insomnia.        Attention difficulties.  All other systems reviewed and are negative.      Objective:   BP 126/78   Pulse 88   Ht 5' 5 (1.651 m)   Wt 165 lb 6.4 oz (75 kg)   SpO2 97%   BMI 27.52 kg/m   Vitals:   07/16/24 0913  BP: 126/78  Pulse: 88  Height: 5' 5 (1.651 m)  Weight: 165 lb 6.4 oz (75 kg)  SpO2: 97%  BMI (Calculated): 27.52    Physical Exam Vitals and nursing note reviewed.  Constitutional:       Appearance: Normal appearance. She is normal weight.  HENT:     Head: Normocephalic.  Eyes:     Extraocular Movements: Extraocular movements intact.     Conjunctiva/sclera: Conjunctivae normal.     Pupils: Pupils are equal, round, and reactive to light.  Cardiovascular:     Rate and Rhythm: Normal rate.  Pulmonary:     Effort: Pulmonary effort is normal.  Neurological:     General: No focal deficit present.     Mental Status: She  is alert and oriented to person, place, and time. Mental status is at baseline.  Psychiatric:        Mood and Affect: Mood normal.        Behavior: Behavior normal.        Thought Content: Thought content normal.      No results found for any visits on 07/16/24.  No results found for this or any previous visit (from the past 2160 hours).     Assessment & Plan Cerebellar lesion Patient is seen by neurology, who manage this condition.  She is well controlled with current therapy.   Will defer to them for further changes to plan of care.  Chronic pain of right knee Patient stable.  Well controlled with current therapy.   Continue current meds.   Mixed hyperlipidemia Checking labs today.  Continue current therapy for lipid control. Will modify as needed based on labwork results.   -CMP w/eGFR -Lipid Panel  Prediabetes A1C Continues to be in prediabetic ranges.  Will reassess at follow up after next lab check.  Patient counseled on dietary choices and verbalized understanding.   -CBC w/Diff -CMP w/eGFR -Hemoglobin A1C  Vitamin D  deficiency, unspecified Other fatigue B12 deficiency due to diet Thyroid  nodule Checking labs today.  Will continue supplements as needed.   - Vitamin D  - Vitamin B12 - TSH  Generalized abdominal pain Nausea and vomiting, unspecified vomiting type Setting patient up for referral to gastroenterology.  Will defer to them for further treatment changes.  Reassess at follow up.  Long term current  use of therapeutic drug Attention deficit hyperactivity disorder (ADHD), inattentive type, moderate UDS obtained in office today.  Will call with results  Will send refill once we have results in the system.      Return in about 3 months (around 10/15/2024).   Total time spent: 30 minutes  ALAN CHRISTELLA ARRANT, FNP  07/16/2024   This document may have been prepared by [facility] Voice Recognition software and as such may include unintentional dictation errors.

## 2024-07-16 NOTE — Assessment & Plan Note (Signed)
 Checking labs today.  Will continue supplements as needed.   - Vitamin D  - Vitamin B12 - TSH

## 2024-07-16 NOTE — Assessment & Plan Note (Signed)
 Patient is seen by neurology, who manage this condition.  She is well controlled with current therapy.   Will defer to them for further changes to plan of care.

## 2024-07-16 NOTE — Patient Instructions (Addendum)
 [facility] [address] [place], [place] [address]  Office: [phone]   Float Therapy: [facility]  Phone: [phone] Address: [address] [place], [place], [place] [address]

## 2024-07-17 LAB — CBC WITH DIFFERENTIAL/PLATELET
Basophils Absolute: 0.1 x10E3/uL (ref 0.0–0.2)
Basos: 1 %
EOS (ABSOLUTE): 0.2 x10E3/uL (ref 0.0–0.4)
Eos: 2 %
Hematocrit: 42.6 % (ref 34.0–46.6)
Hemoglobin: 14.2 g/dL (ref 11.1–15.9)
Immature Grans (Abs): 0 x10E3/uL (ref 0.0–0.1)
Immature Granulocytes: 0 %
Lymphocytes Absolute: 2.8 x10E3/uL (ref 0.7–3.1)
Lymphs: 29 %
MCH: 32.1 pg (ref 26.6–33.0)
MCHC: 33.3 g/dL (ref 31.5–35.7)
MCV: 96 fL (ref 79–97)
Monocytes Absolute: 0.9 x10E3/uL (ref 0.1–0.9)
Monocytes: 10 %
Neutrophils Absolute: 5.6 x10E3/uL (ref 1.4–7.0)
Neutrophils: 58 %
Platelets: 263 x10E3/uL (ref 150–450)
RBC: 4.43 x10E6/uL (ref 3.77–5.28)
RDW: 13.1 % (ref 11.7–15.4)
WBC: 9.6 x10E3/uL (ref 3.4–10.8)

## 2024-07-17 LAB — CMP14+EGFR
ALT: 15 IU/L (ref 0–32)
AST: 14 IU/L (ref 0–40)
Albumin: 4.6 g/dL (ref 3.9–4.9)
Alkaline Phosphatase: 70 IU/L (ref 41–116)
BUN/Creatinine Ratio: 13 (ref 9–23)
BUN: 9 mg/dL (ref 6–24)
Bilirubin Total: 0.3 mg/dL (ref 0.0–1.2)
CO2: 24 mmol/L (ref 20–29)
Calcium: 10.2 mg/dL (ref 8.7–10.2)
Chloride: 99 mmol/L (ref 96–106)
Creatinine, Ser: 0.69 mg/dL (ref 0.57–1.00)
Globulin, Total: 3.1 g/dL (ref 1.5–4.5)
Glucose: 133 mg/dL — ABNORMAL HIGH (ref 70–99)
Potassium: 4.1 mmol/L (ref 3.5–5.2)
Sodium: 138 mmol/L (ref 134–144)
Total Protein: 7.7 g/dL (ref 6.0–8.5)
eGFR: 107 mL/min/1.73 (ref >59)

## 2024-07-17 LAB — IRON,TIBC AND FERRITIN PANEL
Ferritin: 30 ng/mL (ref 15–150)
Iron Saturation: 31 % (ref 15–55)
Iron: 116 ug/dL (ref 27–159)
Total Iron Binding Capacity: 370 ug/dL (ref 250–450)
UIBC: 254 ug/dL (ref 131–425)

## 2024-07-17 LAB — VITAMIN B12: Vitamin B-12: 343 pg/mL (ref 232–1245)

## 2024-07-17 LAB — LIPID PANEL
Chol/HDL Ratio: 5.3 ratio — ABNORMAL HIGH (ref 0.0–4.4)
Cholesterol, Total: 287 mg/dL — ABNORMAL HIGH (ref 100–199)
HDL: 54 mg/dL (ref >39)
LDL Chol Calc (NIH): 182 mg/dL — ABNORMAL HIGH (ref 0–99)
Triglycerides: 265 mg/dL — ABNORMAL HIGH (ref 0–149)
VLDL Cholesterol Cal: 51 mg/dL — ABNORMAL HIGH (ref 5–40)

## 2024-07-17 LAB — TSH+T4F+T3FREE
Free T4: 1 ng/dL (ref 0.82–1.77)
T3, Free: 3 pg/mL (ref 2.0–4.4)
TSH: 1.13 u[IU]/mL (ref 0.450–4.500)

## 2024-07-17 LAB — HEMOGLOBIN A1C
Est. average glucose Bld gHb Est-mCnc: 100 mg/dL
Hgb A1c MFr Bld: 5.1 % (ref 4.8–5.6)

## 2024-07-17 LAB — VITAMIN D 25 HYDROXY (VIT D DEFICIENCY, FRACTURES): Vit D, 25-Hydroxy: 26.5 ng/mL — ABNORMAL LOW (ref 30.0–100.0)

## 2024-07-18 ENCOUNTER — Encounter: Payer: Self-pay | Admitting: Family

## 2024-07-20 LAB — TOXASSURE SELECT 13 (MW), URINE

## 2024-07-23 ENCOUNTER — Ambulatory Visit (INDEPENDENT_AMBULATORY_CARE_PROVIDER_SITE_OTHER): Admitting: Cardiology

## 2024-07-23 ENCOUNTER — Encounter: Payer: Self-pay | Admitting: Cardiology

## 2024-07-23 VITALS — BP 121/82 | HR 95 | Ht 65.0 in | Wt 166.2 lb

## 2024-07-23 DIAGNOSIS — G4700 Insomnia, unspecified: Secondary | ICD-10-CM

## 2024-07-23 DIAGNOSIS — E7800 Pure hypercholesterolemia, unspecified: Secondary | ICD-10-CM | POA: Insufficient documentation

## 2024-07-23 DIAGNOSIS — R7309 Other abnormal glucose: Secondary | ICD-10-CM

## 2024-07-23 DIAGNOSIS — Z0130 Encounter for examination of blood pressure without abnormal findings: Secondary | ICD-10-CM

## 2024-07-23 NOTE — Progress Notes (Signed)
 Established Patient Office Visit  Subjective:  Patient ID: Sandra Aguilar, female    DOB: 1975-12-04  Age: 48 y.o. MRN: [id number]  Chief Complaint  Patient presents with   Acute Visit    Lab results/not sleeping and having nausea.     Patient in office for an acute visit. Patient wanting to discuss recent lab results, not sleeping.  Discussed recent lab work. LDL much higher than previous check. Patient reports gaining weight while inpatient rehab. Handout given to patient on mediterranean diet.  Vitamin D  level slightly low, recommend OTC vitamin D  supplement.  Discussed trouble sleeping. Patient restarted vistaril  last week, states it is not helping. Reports trazodone  helps but is concerned heart rate goes low when she takes it, however, she states heart rate goes low while sleeping with out trazodone  also. Recommend taking OTC magnesium  supplement to help with sleep  Patient also complains of nausea and night sweats, started when she started Cymbalta. Has been off Cymbalta for 3 weeks, night sweats not as bad. Reassured patient, Cymbalta may still be in her system. Side effects are improving.     No other concerns at this time.   Past Medical History:  Diagnosis Date   Cough 07/16/2020   Soua presents via virtual visit with complaints of cough with dark yellow and green mucous, nasal congestion and stuffiness, low grade fevers, sore throat, nausea, vomiting, and recent sick exposure. She had a recent negative covid test last week, and started Augmentin  for a URI after her negative Covid results.  She states she started feeling a little better then started feeling bad again.     Depression    Family history of breast cancer 05/27/2018   Fibromyalgia    Insomnia    Lung nodule    Lung nodule, solitary 09/04/2020   PTSD (post-traumatic stress disorder)    Strep pharyngitis 02/17/2021   Last Assessment & Plan: Formatting of this note might be different from the original. Will  not check a rapid strep test for her sore throat due to her husband was positive for strep throat diagnosed yesterday in the same clinic.  Will prescribe Pen-Vee K 500 mg 1 every 12 hours for 10 days.  Be sure to drink plenty of fluids including Gatorade, take Tylenol  or Advil  for pain or fever as needed.  I   Stroke [facility])    Suspected COVID-19 virus infection 06/29/2020   Formatting of this note might be different from the original. Season presents via virtual visit with complaints of cough with dark yellow and green mucous, nasal congestion and stuffiness, low grade fevers, sore throat, nausea, vomiting, and recent sick exposure. She had a recent negative covid test last week, and started Augmentin  for a URI after her negative Covid results.  She states she start   Thyroid  nodule     Past Surgical History:  Procedure Laterality Date   APPENDECTOMY     CHOLECYSTECTOMY     LEFT HEART CATH AND CORONARY ANGIOGRAPHY Left 02/20/2024   Procedure: LEFT HEART CATH AND CORONARY ANGIOGRAPHY;  Surgeon: Fernand Denyse LABOR, MD;  Location: [facility] INVASIVE CV LAB;  Service: Cardiovascular;  Laterality: Left;    Social History   Socioeconomic History   Marital status: Married    Spouse name: Not on file   Number of children: Not on file   Years of education: Not on file   Highest education level: Not on file  Occupational History   Not on file  Tobacco Use  Smoking status: Never    Passive exposure: Never   Smokeless tobacco: Never  Vaping Use   Vaping status: Never Used  Substance and Sexual Activity   Alcohol use: Not Currently    Comment: ocassionally   Drug use: Never   Sexual activity: Yes    Birth control/protection: None, Other-see comments    Comment: husband has vasectomy  Other Topics Concern   Not on file  Social History Narrative   Not on file   Social Drivers of Health   Financial Resource Strain: Not on file  Food Insecurity: Not on file  Transportation Needs: Not on file   Physical Activity: Not on file  Stress: Not on file  Social Connections: Unknown (05/08/2020)   [facility]   Social Connections    Frequency of Communication with Friends and Family: Not asked    Frequency of Social Gatherings with Friends and Family: Not asked  Intimate Partner Violence: Unknown (05/08/2020)   Received from [facility]   Intimate Partner Violence    Fear of Current or Ex-Partner: Not asked    Emotionally Abused: Not asked    Physically Abused: Not asked    Sexually Abused: Not asked    Family History  Problem Relation Age of Onset   Heart attack Father    Vascular Disease Father    Vascular Disease Maternal Uncle    Obesity Paternal Aunt    Vascular Disease Paternal Aunt    Heart attack Maternal Grandmother    Ovarian cancer Maternal Grandmother    Vascular Disease Maternal Grandfather    Stroke Paternal Grandmother     Allergies  Allergen Reactions   Ciprofloxacin Nausea And Vomiting and Other (See Comments)    Neuropathy  Neuropathy  Neuropathy  Neuropathy  Neuropathy   Nitrofurantoin Hives    Other reaction(s): Hives/Swelling-Allergy   Sulfa Antibiotics Nausea And Vomiting, Other (See Comments) and Rash    CAUSES PAIN, PT REPORTS    Outpatient Medications Prior to Visit  Medication Sig   albuterol  (VENTOLIN  HFA) 108 (90 Base) MCG/ACT inhaler Inhale 1-2 puffs into the lungs every 6 (six) hours as needed for wheezing or shortness of breath.   fluticasone  (FLONASE ) 50 MCG/ACT nasal spray Place 2 sprays into both nostrils daily.   hydrOXYzine  (VISTARIL ) 50 MG capsule Take 1 capsule (50 mg total) by mouth every 6 (six) hours as needed for itching or anxiety.   hyoscyamine  (LEVSIN  SL) 0.125 MG SL tablet Place 1 tablet (0.125 mg total) under the tongue every 4 (four) hours as needed.   ibuprofen  (ADVIL ) 800 MG tablet Take 1 tablet (800 mg total) by mouth every 8 (eight) hours as needed.   metoprolol  succinate (TOPROL  XL) 25 MG 24 hr  tablet Take 1 tablet (25 mg total) by mouth daily.   ondansetron  (ZOFRAN -ODT) 8 MG disintegrating tablet Take 1 tablet (8 mg total) by mouth every 8 (eight) hours as needed for nausea or vomiting.   SUMAtriptan  (IMITREX ) 100 MG tablet Take 1 tablet (100 mg total) by mouth once as needed for migraine. May repeat in 2 hours if headache persists or recurs.   tiZANidine  (ZANAFLEX ) 4 MG tablet Take 1 tablet (4 mg total) by mouth 3 (three) times daily.   Vilazodone  HCl (VIIBRYD ) 10 MG TABS Take 1 tablet (10 mg total) by mouth daily.   Vilazodone  HCl 20 MG TABS Take 1 tablet (20 mg total) by mouth daily.   No facility-administered medications prior to visit.  Review of Systems  Constitutional: Negative.   HENT: Negative.    Eyes: Negative.   Respiratory: Negative.  Negative for shortness of breath.   Cardiovascular: Negative.  Negative for chest pain.  Gastrointestinal: Negative.  Negative for abdominal pain, constipation and diarrhea.  Genitourinary: Negative.   Musculoskeletal:  Negative for joint pain and myalgias.  Skin: Negative.   Neurological: Negative.  Negative for dizziness and headaches.  Endo/Heme/Allergies: Negative.   Psychiatric/Behavioral:  The patient has insomnia.   All other systems reviewed and are negative.      Objective:   BP 121/82   Pulse 95   Ht 5' 5 (1.651 m)   Wt 166 lb 3.2 oz (75.4 kg)   SpO2 97%   BMI 27.66 kg/m   Vitals:   07/23/24 1049  BP: 121/82  Pulse: 95  Height: 5' 5 (1.651 m)  Weight: 166 lb 3.2 oz (75.4 kg)  SpO2: 97%  BMI (Calculated): 27.66    Physical Exam Vitals and nursing note reviewed.  Constitutional:      Appearance: Normal appearance. She is normal weight.  HENT:     Head: Normocephalic and atraumatic.     Nose: Nose normal.     Mouth/Throat:     Mouth: Mucous membranes are moist.  Eyes:     Extraocular Movements: Extraocular movements intact.     Conjunctiva/sclera: Conjunctivae normal.     Pupils: Pupils are  equal, round, and reactive to light.  Cardiovascular:     Rate and Rhythm: Normal rate and regular rhythm.     Pulses: Normal pulses.     Heart sounds: Normal heart sounds.  Pulmonary:     Effort: Pulmonary effort is normal.     Breath sounds: Normal breath sounds.  Abdominal:     General: Abdomen is flat. Bowel sounds are normal.     Palpations: Abdomen is soft.  Musculoskeletal:        General: Normal range of motion.     Cervical back: Normal range of motion.  Skin:    General: Skin is warm and dry.  Neurological:     General: No focal deficit present.     Mental Status: She is alert and oriented to person, place, and time.  Psychiatric:        Mood and Affect: Mood normal.        Behavior: Behavior normal.        Thought Content: Thought content normal.        Judgment: Judgment normal.      No results found for any visits on 07/23/24.  Recent Results (from the past 2160 hours)  CMP14+EGFR     Status: Abnormal   Collection Time: 07/16/24  9:57 AM  Result Value Ref Range   Glucose 133 (H) 70 - 99 mg/dL   BUN 9 6 - 24 mg/dL   Creatinine, Ser 9.30 0.57 - 1.00 mg/dL   eGFR 892 >40 fO/fpw/8.26   BUN/Creatinine Ratio 13 9 - 23   Sodium 138 134 - 144 mmol/L   Potassium 4.1 3.5 - 5.2 mmol/L   Chloride 99 96 - 106 mmol/L   CO2 24 20 - 29 mmol/L   Calcium 10.2 8.7 - 10.2 mg/dL   Total Protein 7.7 6.0 - 8.5 g/dL   Albumin 4.6 3.9 - 4.9 g/dL   Globulin, Total 3.1 1.5 - 4.5 g/dL   Bilirubin Total 0.3 0.0 - 1.2 mg/dL   Alkaline Phosphatase 70 41 - 116 IU/L  Comment:               **Please note reference interval change**   AST 14 0 - 40 IU/L   ALT 15 0 - 32 IU/L  Lipid panel     Status: Abnormal   Collection Time: 07/16/24  9:57 AM  Result Value Ref Range   Cholesterol, Total 287 (H) 100 - 199 mg/dL   Triglycerides 734 (H) 0 - 149 mg/dL   HDL 54 >60 mg/dL   VLDL Cholesterol Cal 51 (H) 5 - 40 mg/dL   LDL Chol Calc (NIH) 817 (H) 0 - 99 mg/dL   Chol/HDL Ratio 5.3 (H)  0.0 - 4.4 ratio    Comment:                                   T. Chol/HDL Ratio                                             Men  Women                               1/2 Avg.Risk  3.4    3.3                                   Avg.Risk  5.0    4.4                                2X Avg.Risk  9.6    7.1                                3X Avg.Risk 23.4   11.0   Iron, TIBC and Ferritin Panel     Status: None   Collection Time: 07/16/24  9:57 AM  Result Value Ref Range   Total Iron Binding Capacity 370 250 - 450 ug/dL   UIBC 745 868 - 574 ug/dL   Iron 883 27 - 840 ug/dL   Iron Saturation 31 15 - 55 %   Ferritin 30 15 - 150 ng/mL  VITAMIN D  25 Hydroxy (Vit-D Deficiency, Fractures)     Status: Abnormal   Collection Time: 07/16/24  9:57 AM  Result Value Ref Range   Vit D, 25-Hydroxy 26.5 (L) 30.0 - 100.0 ng/mL    Comment: Vitamin D  deficiency has been defined by the Institute of Medicine and an Endocrine Society practice guideline as a level of serum 25-OH vitamin D  less than 20 ng/mL (1,2). The Endocrine Society went on to further define vitamin D  insufficiency as a level between 21 and 29 ng/mL (2). 1. IOM (Institute of Medicine). 2010. Dietary reference    intakes for calcium and D. [place]: The    [employer]. 2. Holick MF, Binkley [place], Bischoff-Ferrari HA, et al.    Evaluation, treatment, and prevention of vitamin D     deficiency: an Endocrine Society clinical practice    guideline. JCEM. 2011 Jul; 96(7):1911-30.   Vitamin B12     Status: None   Collection Time: 07/16/24  9:57  AM  Result Value Ref Range   Vitamin B-12 343 232 - 1,245 pg/mL  CBC with Diff     Status: None   Collection Time: 07/16/24  9:57 AM  Result Value Ref Range   WBC 9.6 3.4 - 10.8 x10E3/uL   RBC 4.43 3.77 - 5.28 x10E6/uL   Hemoglobin 14.2 11.1 - 15.9 g/dL   Hematocrit 57.3 65.9 - 46.6 %   MCV 96 79 - 97 fL   MCH 32.1 26.6 - 33.0 pg   MCHC 33.3 31.5 - 35.7 g/dL   RDW 86.8 88.2 - 84.5 %    Platelets 263 150 - 450 x10E3/uL   Neutrophils 58 Not Estab. %   Lymphs 29 Not Estab. %   Monocytes 10 Not Estab. %   Eos 2 Not Estab. %   Basos 1 Not Estab. %   Neutrophils Absolute 5.6 1.4 - 7.0 x10E3/uL   Lymphocytes Absolute 2.8 0.7 - 3.1 x10E3/uL   Monocytes Absolute 0.9 0.1 - 0.9 x10E3/uL   EOS (ABSOLUTE) 0.2 0.0 - 0.4 x10E3/uL   Basophils Absolute 0.1 0.0 - 0.2 x10E3/uL   Immature Granulocytes 0 Not Estab. %   Immature Grans (Abs) 0.0 0.0 - 0.1 x10E3/uL  Hemoglobin A1c     Status: None   Collection Time: 07/16/24  9:57 AM  Result Value Ref Range   Hgb A1c MFr Bld 5.1 4.8 - 5.6 %    Comment:          Prediabetes: 5.7 - 6.4          Diabetes: >6.4          Glycemic control for adults with diabetes: <7.0    Est. average glucose Bld gHb Est-mCnc 100 mg/dL  [id number]     Status: None   Collection Time: 07/16/24  9:57 AM  Result Value Ref Range   TSH 1.130 0.450 - 4.500 uIU/mL   T3, Free 3.0 2.0 - 4.4 pg/mL   Free T4 1.00 0.82 - 1.77 ng/dL  ToxASSURE Select 13 (MW), Urine     Status: None   Collection Time: 07/16/24  3:44 PM  Result Value Ref Range   Summary FINAL     Comment: ==================================================================== ToxASSURE Select 13 (MW) ==================================================================== Test                             Result       Flag       Units    NO DRUGS DETECTED. ==================================================================== Test                      Result    Flag   Units      Ref Range   Creatinine              179              mg/dL      >=79 ==================================================================== Declared Medications:  Medication list was not provided. ==================================================================== For clinical consultation, please call [phone]. ====================================================================       Assessment & Plan:   Mediterranean diet to lower LDL Magnesium  to help with sleep  Problem List Items Addressed This Visit       Other   Insomnia - Primary   Elevated LDL cholesterol level    Return if symptoms worsen or fail to improve, for as scheduled with Alan.   Total time spent: 25 minutes  Jeoffrey Pollen, NP  07/23/2024   This document may have been prepared by Dragon Voice Recognition software and as such may include unintentional dictation errors.

## 2024-07-23 NOTE — Patient Instructions (Signed)
 Magnesium  for sleep

## 2024-07-24 NOTE — Progress Notes (Deleted)
 ANNUAL GYNECOLOGICAL EXAM  SUBJECTIVE  HPI  Sandra Aguilar is a 48 y.o.-year-old [id number] who presents for an annual gynecological exam today.  She denies pelvic pain, dyspareunia, abnormal vaginal bleeding or discharge, and UTI symptoms. ***  Medical/Surgical History Past Medical History:  Diagnosis Date   Cough 07/16/2020   Sandra Aguilar presents via virtual visit with complaints of cough with dark yellow and green mucous, nasal congestion and stuffiness, low grade fevers, sore throat, nausea, vomiting, and recent sick exposure. She had a recent negative covid test last week, and started Augmentin  for a URI after her negative Covid results.  She states she started feeling a little better then started feeling bad again.     Depression    Family history of breast cancer 05/27/2018   Fibromyalgia    Insomnia    Lung nodule    Lung nodule, solitary 09/04/2020   PTSD (post-traumatic stress disorder)    Strep pharyngitis 02/17/2021   Last Assessment & Plan: Formatting of this note might be different from the original. Will not check a rapid strep test for her sore throat due to her husband was positive for strep throat diagnosed yesterday in the same clinic.  Will prescribe Pen-Vee K 500 mg 1 every 12 hours for 10 days.  Be sure to drink plenty of fluids including Gatorade, take Tylenol  or Advil  for pain or fever as needed.  I   Stroke [facility])    Suspected COVID-19 virus infection 06/29/2020   Formatting of this note might be different from the original. Sandra Aguilar presents via virtual visit with complaints of cough with dark yellow and green mucous, nasal congestion and stuffiness, low grade fevers, sore throat, nausea, vomiting, and recent sick exposure. She had a recent negative covid test last week, and started Augmentin  for a URI after her negative Covid results.  She states she start   Thyroid  nodule    Past Surgical History:  Procedure Laterality Date   APPENDECTOMY     CHOLECYSTECTOMY      LEFT HEART CATH AND CORONARY ANGIOGRAPHY Left 02/20/2024   Procedure: LEFT HEART CATH AND CORONARY ANGIOGRAPHY;  Surgeon: Fernand Denyse LABOR, MD;  Location: [facility] INVASIVE CV LAB;  Service: Cardiovascular;  Laterality: Left;    Social History Lives with ***. ***Feels safe there Work: Exercise: Substances: ***EtOH, tobacco, vape, and recreational drugs  Obstetric History OB History     Gravida  4   Para  3   Term  3   Preterm      AB  1   Living  3      SAB  1   IAB      Ectopic      Multiple      Live Births  3            GYN/Menstrual History No LMP recorded. {Regular/irregular menstrual period abdominal pain hpi md:[id number]} Last Pap: Contraception:  Prevention Dentist Eye exam Mammogram Colonoscopy Flu shot/vaccines  Current Medications Outpatient Medications Prior to Visit  Medication Sig   albuterol  (VENTOLIN  HFA) 108 (90 Base) MCG/ACT inhaler Inhale 1-2 puffs into the lungs every 6 (six) hours as needed for wheezing or shortness of breath.   fluticasone  (FLONASE ) 50 MCG/ACT nasal spray Place 2 sprays into both nostrils daily.   hydrOXYzine  (VISTARIL ) 50 MG capsule Take 1 capsule (50 mg total) by mouth every 6 (six) hours as needed for itching or anxiety.   hyoscyamine  (LEVSIN  SL) 0.125 MG SL tablet Place 1 tablet (0.125  mg total) under the tongue every 4 (four) hours as needed.   ibuprofen  (ADVIL ) 800 MG tablet Take 1 tablet (800 mg total) by mouth every 8 (eight) hours as needed.   metoprolol  succinate (TOPROL  XL) 25 MG 24 hr tablet Take 1 tablet (25 mg total) by mouth daily.   ondansetron  (ZOFRAN -ODT) 8 MG disintegrating tablet Take 1 tablet (8 mg total) by mouth every 8 (eight) hours as needed for nausea or vomiting.   SUMAtriptan  (IMITREX ) 100 MG tablet Take 1 tablet (100 mg total) by mouth once as needed for migraine. May repeat in 2 hours if headache persists or recurs.   tiZANidine  (ZANAFLEX ) 4 MG tablet Take 1 tablet (4 mg total) by mouth 3  (three) times daily.   Vilazodone  HCl (VIIBRYD ) 10 MG TABS Take 1 tablet (10 mg total) by mouth daily.   Vilazodone  HCl 20 MG TABS Take 1 tablet (20 mg total) by mouth daily.   No facility-administered medications prior to visit.        ROS Constitutional: Denied constitutional symptoms, night sweats, recent illness, fatigue, fever, insomnia and weight loss.  Eyes: Denied eye symptoms, eye pain, photophobia, vision change and visual disturbance.  Ears/Nose/Throat/Neck: Denied ear, nose, throat or neck symptoms, hearing loss, nasal discharge, sinus congestion and sore throat.  Cardiovascular: Denied cardiovascular symptoms, arrhythmia, chest pain/pressure, edema, exercise intolerance, orthopnea and palpitations.  Respiratory: Denied pulmonary symptoms, asthma, pleuritic pain, productive sputum, cough, dyspnea and wheezing.  Gastrointestinal: Denied gastro-esophageal reflux, melena, nausea and vomiting.  Genitourinary:*** Denied genitourinary symptoms including symptomatic vaginal discharge, pelvic relaxation issues, and urinary complaints.  Musculoskeletal: Denied musculoskeletal symptoms, stiffness, swelling, muscle weakness and myalgia.  Dermatologic: Denied dermatology symptoms, rash and scar.  Neurologic: Denied neurology symptoms, dizziness, headache, neck pain and syncope.  Psychiatric: Denied psychiatric symptoms, anxiety and depression.  Endocrine: Denied endocrine symptoms including hot flashes and night sweats.    OBJECTIVE  There were no vitals taken for this visit.   Physical examination General NAD, Conversant  HEENT Atraumatic; Op clear with mmm.  Normo-cephalic. Pupils reactive. Anicteric sclerae  Thyroid /Neck Smooth without nodularity or enlargement. Normal ROM.  Neck Supple.  Skin No rashes, lesions or ulceration. Normal palpated skin turgor. No nodularity.  Breasts: No masses or discharge.  Symmetric.  No axillary adenopathy.  Lungs: Clear to auscultation.No rales  or wheezes. Normal Respiratory effort, no retractions.  Heart: NSR.  No murmurs or rubs appreciated. No peripheral edema  Abdomen: Soft.  Non-tender.  No masses.  No HSM. No hernia  Extremities: Moves all appropriately.  Normal ROM for age. No lymphadenopathy.  Neuro: Oriented to PPT.  Normal mood. Normal affect.     Pelvic:   Vulva: Normal appearance.  No lesions.  Vagina: No lesions or abnormalities noted.  Support: Normal pelvic support.  Urethra No masses tenderness or scarring.  Meatus Normal size without lesions or prolapse.  Cervix: Normal appearance.  No lesions.  Anus: Normal exam.  No lesions.  Perineum: Normal exam.  No lesions.        Bimanual   Uterus: Normal size.  Non-tender.  Mobile.  AV.  Adnexae: No masses.  Non-tender to palpation.  Cul-de-sac: Negative for abnormality.    ASSESSMENT  1) Annual exam  PLAN 1) Physical exam as noted. Discussed healthy lifestyle choices and preventive care. 2) 3) Return in one year for annual exam or as needed for concerns.   Melissa Swanson, CNM

## 2024-07-25 ENCOUNTER — Ambulatory Visit: Admitting: Obstetrics

## 2024-07-25 DIAGNOSIS — Z0000 Encounter for general adult medical examination without abnormal findings: Secondary | ICD-10-CM

## 2024-07-25 DIAGNOSIS — Z1239 Encounter for other screening for malignant neoplasm of breast: Secondary | ICD-10-CM

## 2024-07-25 DIAGNOSIS — Z124 Encounter for screening for malignant neoplasm of cervix: Secondary | ICD-10-CM

## 2024-07-27 ENCOUNTER — Ambulatory Visit: Payer: Self-pay | Admitting: Family

## 2024-07-29 ENCOUNTER — Other Ambulatory Visit: Payer: Self-pay

## 2024-07-29 MED ORDER — ROSUVASTATIN CALCIUM 5 MG PO TABS: 5.0000 mg | ORAL_TABLET | Freq: Every day | ORAL | 11 refills | Status: AC

## 2024-07-29 MED ORDER — VITAMIN D (ERGOCALCIFEROL) 1.25 MG (50000 UNIT) PO CAPS: 50000.0000 [IU] | ORAL_CAPSULE | ORAL | 3 refills | Status: AC

## 2024-08-07 ENCOUNTER — Other Ambulatory Visit: Payer: Self-pay | Admitting: Family

## 2024-09-04 ENCOUNTER — Encounter: Payer: Self-pay | Admitting: Family

## 2024-09-04 ENCOUNTER — Ambulatory Visit: Admitting: Family

## 2024-09-04 VITALS — BP 123/77 | HR 66 | Ht 65.0 in | Wt 176.8 lb

## 2024-09-04 DIAGNOSIS — R197 Diarrhea, unspecified: Secondary | ICD-10-CM

## 2024-09-04 DIAGNOSIS — Z0130 Encounter for examination of blood pressure without abnormal findings: Secondary | ICD-10-CM

## 2024-09-04 MED ORDER — WEGOVY 0.5 MG/0.5ML ~~LOC~~ SOAJ: 0.5000 mg | SUBCUTANEOUS | 0 refills | Status: DC

## 2024-09-04 NOTE — Patient Instructions (Signed)
 [facility] GI office - [phone]

## 2024-09-16 ENCOUNTER — Ambulatory Visit (INDEPENDENT_AMBULATORY_CARE_PROVIDER_SITE_OTHER): Admitting: Family

## 2024-09-16 DIAGNOSIS — R300 Dysuria: Secondary | ICD-10-CM | POA: Diagnosis not present

## 2024-09-16 LAB — POCT URINALYSIS DIPSTICK
Bilirubin, UA: NEGATIVE
Glucose, UA: NEGATIVE
Ketones, UA: NEGATIVE
Nitrite, UA: NEGATIVE
Protein, UA: NEGATIVE
Spec Grav, UA: >=1.03 — AB (ref 1.010–1.025)
Urobilinogen, UA: 0.2 U/dL
pH, UA: 5.5 (ref 5.0–8.0)

## 2024-09-16 NOTE — Progress Notes (Signed)
   CHIEF COMPLAINT  UA/ only visit fot UTI     REASON FOR VISIT  Possible UTI, UA Visit Only      ASSESSMENT & PLAN Diagnoses and all orders for this visit:  Dysuria -     POCT Urinalysis Dipstick (18997) -     Urine Culture   Will call with results when available.    Patient notified.  Total time spent: 5 minutes  ALAN CHRISTELLA ARRANT, FNP 09/16/2024

## 2024-09-19 LAB — URINE CULTURE

## 2024-09-20 MED ORDER — CIPROFLOXACIN HCL 500 MG PO TABS: 500.0000 mg | ORAL_TABLET | Freq: Two times a day (BID) | ORAL | 0 refills | Status: AC

## 2024-09-20 MED ORDER — CEFDINIR 300 MG PO CAPS: 300.0000 mg | ORAL_CAPSULE | Freq: Two times a day (BID) | ORAL | 0 refills | Status: DC

## 2024-09-20 NOTE — Addendum Note (Signed)
 Addended by: ORLEAN PALMA on: 09/20/2024 02:05 PM   Modules accepted: Orders

## 2024-09-21 ENCOUNTER — Emergency Department
Admission: EM | Admit: 2024-09-21 | Discharge: 2024-09-21 | Disposition: A | Discharge status: Home / Self Care | Attending: Emergency Medicine | Admitting: Emergency Medicine

## 2024-09-21 ENCOUNTER — Emergency Department

## 2024-09-21 ENCOUNTER — Other Ambulatory Visit: Payer: Self-pay

## 2024-09-21 DIAGNOSIS — R1032 Left lower quadrant pain: Secondary | ICD-10-CM | POA: Diagnosis not present

## 2024-09-21 DIAGNOSIS — R10A2 Flank pain, left side: Secondary | ICD-10-CM | POA: Diagnosis not present

## 2024-09-21 DIAGNOSIS — M545 Low back pain, unspecified: Secondary | ICD-10-CM | POA: Diagnosis present

## 2024-09-21 LAB — CBC
HCT: 38.8 % (ref 36.0–46.0)
Hemoglobin: 12.7 g/dL (ref 12.0–15.0)
MCH: 30.9 pg (ref 26.0–34.0)
MCHC: 32.7 g/dL (ref 30.0–36.0)
MCV: 94.4 fL (ref 80.0–100.0)
Platelets: 213 K/uL (ref 150–400)
RBC: 4.11 MIL/uL (ref 3.87–5.11)
RDW: 12.4 % (ref 11.5–15.5)
WBC: 9.1 K/uL (ref 4.0–10.5)
nRBC: 0 % (ref 0.0–0.2)

## 2024-09-21 LAB — URINALYSIS, ROUTINE W REFLEX MICROSCOPIC
Bacteria, UA: NONE SEEN
Bilirubin Urine: NEGATIVE
Glucose, UA: NEGATIVE mg/dL
Ketones, ur: NEGATIVE mg/dL
Nitrite: NEGATIVE
Protein, ur: NEGATIVE mg/dL
Specific Gravity, Urine: 1.02 (ref 1.005–1.030)
pH: 5 (ref 5.0–8.0)

## 2024-09-21 LAB — BASIC METABOLIC PANEL WITH GFR
Anion gap: 10 (ref 5–15)
BUN: 8 mg/dL (ref 6–20)
CO2: 23 mmol/L (ref 22–32)
Calcium: 9.3 mg/dL (ref 8.9–10.3)
Chloride: 106 mmol/L (ref 98–111)
Creatinine, Ser: 0.86 mg/dL (ref 0.44–1.00)
GFR, Estimated: >60 mL/min (ref >60)
Glucose, Bld: 111 mg/dL — ABNORMAL HIGH (ref 70–99)
Potassium: 3.6 mmol/L (ref 3.5–5.1)
Sodium: 138 mmol/L (ref 135–145)

## 2024-09-21 LAB — POC URINE PREG, ED: Preg Test, Ur: NEGATIVE

## 2024-09-21 MED ORDER — HYDROCODONE-ACETAMINOPHEN 5-325 MG PO TABS: 1.0000 | ORAL_TABLET | Freq: Four times a day (QID) | ORAL | 0 refills | Status: AC | PRN

## 2024-09-21 NOTE — Discharge Instructions (Signed)
 Follow-up with your primary care provider if you are not improving over the next few days.  Take the antibiotic as prescribed and until finished.  If you take the pain medication prescribed today, please be advised that it may make you sleepy or dizzy and you should not drive or operate machinery for at least 8 hours after the last dose.

## 2024-09-21 NOTE — ED Notes (Signed)
 Pt to ED for L lower back pain since  3-4 days ago worse since last night. Pt unsure but thinks PCP may have diagnosed her with UTI last week. Having frontal HA as well. Pt is ambulatory with steady gait.

## 2024-09-21 NOTE — ED Provider Notes (Signed)
 [facility] Provider Note    Event Date/Time   First MD Initiated Contact with Patient 09/21/24 [phone]     (approximate)   History   Back Pain   HPI  Sandra Aguilar is a 48 y.o. female with history of fibromyalgia, chronic pain syndrome, SVT, and as listed in EMR presents to the emergency department for treatment and evaluation of left lower back pain that started Thursday night.  Pain is worse with movement.  She is currently on antibiotic for UTI but she has only had 2 or 3 doses.  She denies history of kidney stones.     Physical Exam    Vitals:   09/21/24 0806 09/21/24 0920  BP: 138/74   Pulse: 83 66  Resp: 16   Temp: 98.3 F (36.8 C)   SpO2: 100%     General: Awake, no distress.  CV:  Good peripheral perfusion.  Resp:  Normal effort.  Abd:  No distention.  Other:  Left side CVA tenderness   ED Results / Procedures / Treatments   Labs (all labs ordered are listed, but only abnormal results are displayed)  Labs Reviewed  URINALYSIS, ROUTINE W REFLEX MICROSCOPIC - Abnormal; Notable for the following components:      Result Value   Color, Urine YELLOW (*)    APPearance CLOUDY (*)    Hgb urine dipstick MODERATE (*)    Leukocytes,Ua MODERATE (*)    All other components within normal limits  BASIC METABOLIC PANEL WITH GFR - Abnormal; Notable for the following components:   Glucose, Bld 111 (*)    All other components within normal limits  CBC  POC URINE PREG, ED     EKG  Not indicated   RADIOLOGY  Image and radiology report reviewed and interpreted by me. Radiology report consistent with the same.  CT renal stone study negative for acute concerns.  Moderate stool burden is noted.  PROCEDURES:  Critical Care performed: No  Procedures   MEDICATIONS ORDERED IN ED:  Medications - No data to display   IMPRESSION / MDM / ASSESSMENT AND PLAN / ED COURSE   I have reviewed the triage note and vital signs. Vital signs  stable   Differential diagnosis includes, but is not limited to, kidney stone, pyelonephritis, musculoskeletal strain complicated UTI  Patient's presentation is most consistent with acute illness / injury with system symptoms.  48 year old female presenting to the emergency department for treatment evaluation of left-sided lower back pain that radiates around to the left lower abdomen.  See HPI for further details.  On exam, she does have some CVA tenderness.  Plan will be to get a CT for renal stone study.  CT shows no acute concerns.  There is a moderate stool burden.   Outside records reviewed from her visit with primary care.  Her urine culture showed lactobacillus and she is currently on cefdinir .  She was advised to continue taking that medication as prescribed and until finished.  She will be prescribed a short course of pain medication today.  Medication teaching provided.  She is to follow-up with her primary care provider if pain is not improving over the next few days.  ER return precautions discussed as well.   FINAL CLINICAL IMPRESSION(S) / ED DIAGNOSES   Final diagnoses:  Acute left flank pain     Rx / DC Orders   ED Discharge Orders          Ordered  HYDROcodone -acetaminophen  (NORCO/VICODIN) 5-325 MG tablet  Every 6 hours PRN        09/21/24 9076             Note:  This document was prepared using Dragon voice recognition software and may include unintentional dictation errors.   Herlinda Kirk NOVAK, FNP 09/21/24 1309    Bradler, Evan K, MD 09/21/24 [phone]

## 2024-09-21 NOTE — ED Triage Notes (Signed)
 Pt to ED for Left lower back pain started Thursday night. Pain worsens with movement. Pt currently on antibiotic for UTI

## 2024-10-01 ENCOUNTER — Ambulatory Visit

## 2024-10-04 ENCOUNTER — Ambulatory Visit: Payer: Self-pay

## 2024-10-04 ENCOUNTER — Ambulatory Visit

## 2024-10-04 DIAGNOSIS — K222 Esophageal obstruction: Secondary | ICD-10-CM | POA: Diagnosis not present

## 2024-10-04 DIAGNOSIS — R1319 Other dysphagia: Secondary | ICD-10-CM | POA: Diagnosis not present

## 2024-10-04 DIAGNOSIS — K2100 Gastro-esophageal reflux disease with esophagitis, without bleeding: Secondary | ICD-10-CM | POA: Diagnosis not present

## 2024-10-04 DIAGNOSIS — R197 Diarrhea, unspecified: Secondary | ICD-10-CM | POA: Diagnosis present

## 2024-10-04 DIAGNOSIS — K5730 Diverticulosis of large intestine without perforation or abscess without bleeding: Secondary | ICD-10-CM | POA: Diagnosis not present

## 2024-10-04 DIAGNOSIS — K640 First degree hemorrhoids: Secondary | ICD-10-CM | POA: Diagnosis not present

## 2024-10-14 ENCOUNTER — Encounter: Payer: Self-pay | Admitting: Internal Medicine

## 2024-10-14 ENCOUNTER — Ambulatory Visit: Payer: Self-pay | Admitting: Internal Medicine

## 2024-10-14 ENCOUNTER — Ambulatory Visit: Admitting: Internal Medicine

## 2024-10-14 VITALS — BP 146/84 | HR 83 | Ht 65.0 in | Wt 173.8 lb

## 2024-10-14 DIAGNOSIS — R300 Dysuria: Secondary | ICD-10-CM

## 2024-10-14 DIAGNOSIS — E782 Mixed hyperlipidemia: Secondary | ICD-10-CM

## 2024-10-14 DIAGNOSIS — E538 Deficiency of other specified B group vitamins: Secondary | ICD-10-CM

## 2024-10-14 DIAGNOSIS — Z20828 Contact with and (suspected) exposure to other viral communicable diseases: Secondary | ICD-10-CM

## 2024-10-14 DIAGNOSIS — M797 Fibromyalgia: Secondary | ICD-10-CM

## 2024-10-14 DIAGNOSIS — F4310 Post-traumatic stress disorder, unspecified: Secondary | ICD-10-CM

## 2024-10-14 DIAGNOSIS — E559 Vitamin D deficiency, unspecified: Secondary | ICD-10-CM | POA: Insufficient documentation

## 2024-10-14 DIAGNOSIS — R7303 Prediabetes: Secondary | ICD-10-CM

## 2024-10-14 DIAGNOSIS — F411 Generalized anxiety disorder: Secondary | ICD-10-CM

## 2024-10-14 LAB — POCT URINALYSIS DIPSTICK
Bilirubin, UA: NEGATIVE
Blood, UA: POSITIVE
Glucose, UA: NEGATIVE
Ketones, UA: NEGATIVE
Leukocytes, UA: NEGATIVE
Nitrite, UA: NEGATIVE
Protein, UA: POSITIVE — AB
Spec Grav, UA: >=1.03 — AB (ref 1.010–1.025)
Urobilinogen, UA: 0.2 U/dL
pH, UA: 5 (ref 5.0–8.0)

## 2024-10-14 LAB — POCT XPERT XPRESS SARS COVID-2/FLU/RSV
FLU A: NEGATIVE
FLU B: NEGATIVE
RSV RNA, PCR: NEGATIVE
SARS Coronavirus 2: NEGATIVE

## 2024-10-14 MED ORDER — PRAZOSIN HCL 2 MG PO CAPS: 2.0000 mg | ORAL_CAPSULE | Freq: Every day | ORAL | 6 refills | Status: AC

## 2024-10-14 NOTE — Progress Notes (Signed)
 Established Patient Office Visit  Subjective:  Patient ID: Sandra Aguilar, female    DOB: [birth year]  Age: 48 y.o. MRN: [id number]  Chief Complaint  Patient presents with   Follow-up    1 month follow up. SOB and headache started Sunday. Has bumps on left arm.     Patient comes in with several vague complaints today.  She is worried about exposure to flu and is also worried about a urinary tract infection.  Her urine dipstick is negative as well as her COVID/flu/RSV testing.  Patient has history of fibromyalgia so reports that she feels generally tired and achy all over.  She moves her lower jaw the right and left during this conversation and grinds on her teeth.  Patient admits to being under a lot of anxiety for several years.  She also has history of PTSD.  Reports of difficulty sleeping at night and when she does she has some bad dreams.  Patient had lost a lot of family members during the holiday season and it brings up her anxiety and depression.  Currently she is on several medications which she thinks are helping her except in December she feels more anxious.  Patient agrees to try prazosin  2 mg at bedtime.  She is also seeing a therapist which is helping her a little. Patient reports of a history of pulmonary nodule but her most recent CT chest did not show any pulmonary nodules.  Reports of having intermittent shortness of breath, she had a negative cardiac cath as well as a chest x-ray done recently for similar complaints. Patient reassured, will get fasting blood work, prescription sent for prazosin  to her local pharmacy.    No other concerns at this time.   Past Medical History:  Diagnosis Date   Cough 07/16/2020   Sandra Aguilar presents via virtual visit with complaints of cough with dark yellow and green mucous, nasal congestion and stuffiness, low grade fevers, sore throat, nausea, vomiting, and recent sick exposure. She had a recent negative covid test last week, and started  Augmentin  for a URI after her negative Covid results.  She states she started feeling a little better then started feeling bad again.     Depression    Family history of breast cancer 05/27/2018   Fibromyalgia    Insomnia    Lung nodule    Lung nodule, solitary 09/04/2020   PTSD (post-traumatic stress disorder)    Strep pharyngitis 02/17/2021   Last Assessment & Plan: Formatting of this note might be different from the original. Will not check a rapid strep test for her sore throat due to her husband was positive for strep throat diagnosed yesterday in the same clinic.  Will prescribe Pen-Vee K 500 mg 1 every 12 hours for 10 days.  Be sure to drink plenty of fluids including Gatorade, take Tylenol  or Advil  for pain or fever as needed.  I   Stroke [facility])    Suspected COVID-19 virus infection 06/29/2020   Formatting of this note might be different from the original. [name] presents via virtual visit with complaints of cough with dark yellow and green mucous, nasal congestion and stuffiness, low grade fevers, sore throat, nausea, vomiting, and recent sick exposure. She had a recent negative covid test last week, and started Augmentin  for a URI after her negative Covid results.  She states she start   Thyroid  nodule     Past Surgical History:  Procedure Laterality Date   APPENDECTOMY  CHOLECYSTECTOMY     LEFT HEART CATH AND CORONARY ANGIOGRAPHY Left 02/20/2024   Procedure: LEFT HEART CATH AND CORONARY ANGIOGRAPHY;  Surgeon: Fernand Denyse LABOR, MD;  Location: [facility] INVASIVE CV LAB;  Service: Cardiovascular;  Laterality: Left;    Social History   Socioeconomic History   Marital status: Married    Spouse name: Not on file   Number of children: Not on file   Years of education: Not on file   Highest education level: Not on file  Occupational History   Not on file  Tobacco Use   Smoking status: Never    Passive exposure: Never   Smokeless tobacco: Never  Vaping Use   Vaping status: Never  Used  Substance and Sexual Activity   Alcohol use: Not Currently    Comment: ocassionally   Drug use: Never   Sexual activity: Yes    Birth control/protection: None, Other-see comments    Comment: husband has vasectomy  Other Topics Concern   Not on file  Social History Narrative   Not on file   Social Drivers of Health   Tobacco Use: Low Risk (10/14/2024)   Patient History    Smoking Tobacco Use: Never    Smokeless Tobacco Use: Never    Passive Exposure: Never  Financial Resource Strain: Not on file  Food Insecurity: Not on file  Transportation Needs: Not on file  Physical Activity: Not on file  Stress: Not on file  Social Connections: Not on file  Intimate Partner Violence: Not on file  Depression (PHQ2-9): High Risk (02/05/2024)   Depression (PHQ2-9)    PHQ-2 Score: 15  Alcohol Screen: Not on file  Housing: Not on file  Utilities: Not on file  Health Literacy: Not on file    Family History  Problem Relation Age of Onset   Heart attack Father    Vascular Disease Father    Vascular Disease Maternal Uncle    Obesity Paternal Aunt    Vascular Disease Paternal Aunt    Heart attack Maternal Grandmother    Ovarian cancer Maternal Grandmother    Vascular Disease Maternal Grandfather    Stroke Paternal Grandmother     Allergies[1]  Show/hide medication list[2]  Review of Systems  Constitutional:  Positive for malaise/fatigue. Negative for chills and fever.  HENT: Negative.  Negative for congestion and sore throat.   Eyes: Negative.  Negative for blurred vision and pain.  Respiratory:  Positive for shortness of breath. Negative for cough.   Cardiovascular: Negative.  Negative for chest pain, palpitations and leg swelling.  Gastrointestinal: Negative.  Negative for abdominal pain, blood in stool, constipation, diarrhea, heartburn, melena, nausea and vomiting.  Genitourinary: Negative.  Negative for dysuria, flank pain, frequency and urgency.  Musculoskeletal:   Positive for myalgias. Negative for joint pain.  Skin: Negative.   Neurological: Negative.  Negative for dizziness, tingling, sensory change, weakness and headaches.  Endo/Heme/Allergies: Negative.   Psychiatric/Behavioral:  Positive for depression. Negative for suicidal ideas. The patient is nervous/anxious.        Objective:   BP (!) 146/84   Pulse 83   Ht 5' 5 (1.651 m)   Wt 173 lb 12.8 oz (78.8 kg)   SpO2 97%   BMI 28.92 kg/m   Vitals:   10/14/24 1416  BP: (!) 146/84  Pulse: 83  Height: 5' 5 (1.651 m)  Weight: 173 lb 12.8 oz (78.8 kg)  SpO2: 97%  BMI (Calculated): 28.92    Physical Exam Vitals and nursing note  reviewed.  Constitutional:      Appearance: Normal appearance.  HENT:     Head: Normocephalic and atraumatic.     Nose: Nose normal.     Mouth/Throat:     Mouth: Mucous membranes are moist.     Pharynx: Oropharynx is clear.  Eyes:     Conjunctiva/sclera: Conjunctivae normal.     Pupils: Pupils are equal, round, and reactive to light.  Cardiovascular:     Rate and Rhythm: Normal rate and regular rhythm.     Pulses: Normal pulses.     Heart sounds: Normal heart sounds. No murmur heard. Pulmonary:     Effort: Pulmonary effort is normal.     Breath sounds: Normal breath sounds. No wheezing.  Abdominal:     General: Bowel sounds are normal.     Palpations: Abdomen is soft.     Tenderness: There is no abdominal tenderness. There is no right CVA tenderness or left CVA tenderness.  Musculoskeletal:        General: Normal range of motion.     Cervical back: Normal range of motion.     Right lower leg: No edema.     Left lower leg: No edema.  Skin:    General: Skin is warm and dry.  Neurological:     General: No focal deficit present.     Mental Status: She is alert and oriented to person, place, and time.  Psychiatric:        Mood and Affect: Mood normal.        Behavior: Behavior normal.      Results for orders placed or performed in visit on  10/14/24  POCT urinalysis dipstick  Result Value Ref Range   Color, UA     Clarity, UA     Glucose, UA Negative Negative   Bilirubin, UA Negative    Ketones, UA Negative    Spec Grav, UA >=1.030 (A) 1.010 - 1.025   Blood, UA Positive    pH, UA 5.0 5.0 - 8.0   Protein, UA Positive (A) Negative   Urobilinogen, UA 0.2 0.2 or 1.0 E.U./dL   Nitrite, UA Negative    Leukocytes, UA Negative Negative   Appearance     Odor    POCT XPERT XPRESS SARS COVID-2/FLU/RSV [ENR627340]  Result Value Ref Range   SARS Coronavirus 2 Negative    FLU A Negative    FLU B Negative    RSV RNA, PCR Negative     Recent Results (from the past 2160 hours)  POCT Urinalysis Dipstick (18997)     Status: Abnormal   Collection Time: 09/16/24  1:19 PM  Result Value Ref Range   Color, UA Yellow    Clarity, UA Cloudy    Glucose, UA Negative Negative   Bilirubin, UA Negative    Ketones, UA Negative    Spec Grav, UA >=1.030 (A) 1.010 - 1.025   Blood, UA Moderate    pH, UA 5.5 5.0 - 8.0   Protein, UA Negative Negative   Urobilinogen, UA 0.2 0.2 or 1.0 E.U./dL   Nitrite, UA Negative    Leukocytes, UA Trace (A) Negative   Appearance Cloudy    Odor Yes   Urine Culture     Status: None   Collection Time: 09/16/24  3:53 PM   Specimen: Urine   UR  Result Value Ref Range   Urine Culture, Routine Final report    Organism ID, Bacteria Lactobacillus species     Comment: Greater  than 100,000 colony forming units per mL Susceptibility not normally performed on this organism.   Urinalysis, Routine w reflex microscopic -Urine, Clean Catch     Status: Abnormal   Collection Time: 09/21/24  8:10 AM  Result Value Ref Range   Color, Urine YELLOW (A) YELLOW   APPearance CLOUDY (A) CLEAR   Specific Gravity, Urine 1.020 1.005 - 1.030   pH 5.0 5.0 - 8.0   Glucose, UA NEGATIVE NEGATIVE mg/dL   Hgb urine dipstick MODERATE (A) NEGATIVE   Bilirubin Urine NEGATIVE NEGATIVE   Ketones, ur NEGATIVE NEGATIVE mg/dL   Protein,  ur NEGATIVE NEGATIVE mg/dL   Nitrite NEGATIVE NEGATIVE   Leukocytes,Ua MODERATE (A) NEGATIVE   RBC / HPF 0-5 0 - 5 RBC/hpf   WBC, UA 21-50 0 - 5 WBC/hpf   Bacteria, UA NONE SEEN NONE SEEN   Squamous Epithelial / HPF 21-50 0 - 5 /HPF   Mucus PRESENT     Comment: Performed at [facility], [address] [place], [place] [address]  CBC     Status: None   Collection Time: 09/21/24  8:10 AM  Result Value Ref Range   WBC 9.1 4.0 - 10.5 K/uL   RBC 4.11 3.87 - 5.11 MIL/uL   Hemoglobin 12.7 12.0 - 15.0 g/dL   HCT 61.1 63.9 - 53.9 %   MCV 94.4 80.0 - 100.0 fL   MCH 30.9 26.0 - 34.0 pg   MCHC 32.7 30.0 - 36.0 g/dL   RDW 87.5 88.4 - 84.4 %   Platelets 213 150 - 400 K/uL   nRBC 0.0 0.0 - 0.2 %    Comment: Performed at [facility], [address] [place], [place] [address]  Basic metabolic panel     Status: Abnormal   Collection Time: 09/21/24  8:10 AM  Result Value Ref Range   Sodium 138 135 - 145 mmol/L   Potassium 3.6 3.5 - 5.1 mmol/L   Chloride 106 98 - 111 mmol/L   CO2 23 22 - 32 mmol/L   Glucose, Bld 111 (H) 70 - 99 mg/dL    Comment: Glucose reference range applies only to samples taken after fasting for at least 8 hours.   BUN 8 6 - 20 mg/dL   Creatinine, Ser 9.13 0.44 - 1.00 mg/dL   Calcium  9.3 8.9 - 10.3 mg/dL   GFR, Estimated >39 >39 mL/min    Comment: (NOTE) Calculated using the CKD-EPI Creatinine Equation (2021)    Anion gap 10 5 - 15    Comment: Performed at [facility], [address] [place], [place] [address]  POC urine preg, ED     Status: None   Collection Time: 09/21/24  8:34 AM  Result Value Ref Range   Preg Test, Ur NEGATIVE NEGATIVE    Comment:        THE SENSITIVITY OF THIS METHODOLOGY IS >20 mIU/mL.   POCT urinalysis dipstick     Status: Abnormal   Collection Time: 10/14/24  2:36 PM  Result Value Ref Range   Color, UA     Clarity, UA     Glucose, UA Negative Negative   Bilirubin, UA Negative    Ketones, UA Negative     Spec Grav, UA >=1.030 (A) 1.010 - 1.025   Blood, UA Positive    pH, UA 5.0 5.0 - 8.0   Protein, UA Positive (A) Negative   Urobilinogen, UA 0.2 0.2 or 1.0 E.U./dL   Nitrite, UA Negative  Leukocytes, UA Negative Negative   Appearance     Odor    POCT XPERT XPRESS SARS COVID-2/FLU/RSV [[id number]]     Status: Normal   Collection Time: 10/14/24  3:14 PM  Result Value Ref Range   SARS Coronavirus 2 Negative    FLU A Negative    FLU B Negative    RSV RNA, PCR Negative       Assessment & Plan:  Prazosin  for PTSD.  Continue other medications.  Check fasting labs.  Continue therapy sessions.  May consider psychiatry referral. Problem List Items Addressed This Visit       Other   Fibromyalgia   GAD (generalized anxiety disorder) - Primary   PTSD (post-traumatic stress disorder)   Relevant Medications   prazosin  (MINIPRESS ) 2 MG capsule   Vitamin D  deficiency   Relevant Orders   Vitamin D  (25 hydroxy)   Other Visit Diagnoses       Burning with urination       Relevant Orders   POCT urinalysis dipstick (Completed)     Exposure to the flu       Relevant Orders   POCT XPERT XPRESS SARS COVID-2/FLU/RSV [ENR627340] (Completed)     B12 deficiency due to diet       Relevant Orders   Vitamin B12     Prediabetes       Relevant Orders   Hemoglobin A1c     Mixed hyperlipidemia       Relevant Medications   prazosin  (MINIPRESS ) 2 MG capsule   Other Relevant Orders   Lipid Panel w/o Chol/HDL Ratio       Return in about 2 weeks (around 10/28/2024).   Total time spent: 30 minutes. This time includes review of previous notes and results and patient face to face interaction during today's visit.    FERNAND FREDY RAMAN, MD  10/14/2024   This document may have been prepared by [facility] Voice Recognition software and as such may include unintentional dictation errors.     [1]  Allergies Allergen Reactions   Ciprofloxacin  Nausea And Vomiting and Other (See Comments)     Neuropathy  Neuropathy  Neuropathy  Neuropathy  Neuropathy   Nitrofurantoin Hives    Other reaction(s): Hives/Swelling-Allergy   Sulfa Antibiotics Nausea And Vomiting, Other (See Comments) and Rash    CAUSES PAIN, PT REPORTS  [2]  Outpatient Medications Prior to Visit  Medication Sig   albuterol  (VENTOLIN  HFA) 108 (90 Base) MCG/ACT inhaler Inhale 1-2 puffs into the lungs every 6 (six) hours as needed for wheezing or shortness of breath.   amphetamine -dextroamphetamine  (ADDERALL XR) 20 MG 24 hr capsule Take 20 mg by mouth in the morning and at bedtime.   fluticasone  (FLONASE ) 50 MCG/ACT nasal spray Place 2 sprays into both nostrils daily.   hydrOXYzine  (VISTARIL ) 50 MG capsule TAKE 1 CAPSULE (50 MG TOTAL) BY MOUTH EVERY 6 (SIX) HOURS AS NEEDED FOR ITCHING OR ANXIETY.   hyoscyamine  (LEVSIN  SL) 0.125 MG SL tablet Place 1 tablet (0.125 mg total) under the tongue every 4 (four) hours as needed.   ibuprofen  (ADVIL ) 800 MG tablet Take 1 tablet (800 mg total) by mouth every 8 (eight) hours as needed.   metoprolol  succinate (TOPROL  XL) 25 MG 24 hr tablet Take 1 tablet (25 mg total) by mouth daily.   ondansetron  (ZOFRAN -ODT) 8 MG disintegrating tablet Take 1 tablet (8 mg total) by mouth every 8 (eight) hours as needed for nausea or vomiting.   rosuvastatin  (CRESTOR ) 5 MG  tablet Take 1 tablet (5 mg total) by mouth daily.   SUMAtriptan  (IMITREX ) 100 MG tablet Take 1 tablet (100 mg total) by mouth once as needed for migraine. May repeat in 2 hours if headache persists or recurs.   tiZANidine  (ZANAFLEX ) 4 MG tablet Take 1 tablet (4 mg total) by mouth 3 (three) times daily.   Vilazodone  HCl 20 MG TABS TAKE 1 TABLET BY MOUTH EVERY DAY   [DISCONTINUED] Vilazodone  HCl (VIIBRYD ) 10 MG TABS Take 1 tablet (10 mg total) by mouth daily.   Vitamin D , Ergocalciferol , (DRISDOL ) 1.25 MG (50000 UNIT) CAPS capsule Take 1 capsule (50,000 Units total) by mouth every 7 (seven) days. (Patient not taking: Reported on  10/14/2024)   [DISCONTINUED] cefdinir  (OMNICEF ) 300 MG capsule Take 1 capsule (300 mg total) by mouth 2 (two) times daily. (Patient not taking: Reported on 10/14/2024)   [DISCONTINUED] semaglutide -weight management (WEGOVY ) 0.5 MG/0.5ML SOAJ SQ injection Inject 0.5 mg into the skin once a week. (Patient not taking: Reported on 10/14/2024)   No facility-administered medications prior to visit.

## 2024-10-15 ENCOUNTER — Ambulatory Visit: Admitting: Family

## 2024-10-15 NOTE — Progress Notes (Signed)
Pt informed

## 2024-10-28 ENCOUNTER — Ambulatory Visit: Admitting: Internal Medicine

## 2024-10-30 ENCOUNTER — Other Ambulatory Visit: Payer: Self-pay | Admitting: Family

## 2024-11-04 ENCOUNTER — Ambulatory Visit: Admitting: Family

## 2024-11-14 ENCOUNTER — Ambulatory Visit: Admitting: Internal Medicine

## 2024-11-21 ENCOUNTER — Encounter: Payer: Self-pay | Admitting: Family

## 2024-11-21 ENCOUNTER — Ambulatory Visit: Admitting: Family

## 2024-11-21 MED ORDER — SEMAGLUTIDE-WEIGHT MANAGEMENT 1.5 MG PO TABS: 1.5000 mg | ORAL_TABLET | Freq: Every day | ORAL | 0 refills | Status: AC

## 2024-11-21 NOTE — Progress Notes (Unsigned)
 "  Established Patient Office Visit  Subjective:  Patient ID: Sandra Aguilar, female    DOB: 1976-10-23  Age: 49 y.o. MRN: [id number]  Chief Complaint  Patient presents with   Follow-up    Discuss medications    HPI  No other concerns at this time.   Past Medical History:  Diagnosis Date   Cough 07/16/2020   Sandra Aguilar presents via virtual visit with complaints of cough with dark yellow and green mucous, nasal congestion and stuffiness, low grade fevers, sore throat, nausea, vomiting, and recent sick exposure. She had a recent negative covid test last week, and started Augmentin  for a URI after her negative Covid results.  She states she started feeling a little better then started feeling bad again.     Depression    Family history of breast cancer 05/27/2018   Fibromyalgia    Insomnia    Lung nodule    Lung nodule, solitary 09/04/2020   PTSD (post-traumatic stress disorder)    Strep pharyngitis 02/17/2021   Last Assessment & Plan: Formatting of this note might be different from the original. Will not check a rapid strep test for her sore throat due to her husband was positive for strep throat diagnosed yesterday in the same clinic.  Will prescribe Pen-Vee K 500 mg 1 every 12 hours for 10 days.  Be sure to drink plenty of fluids including Gatorade, take Tylenol  or Advil  for pain or fever as needed.  I   Stroke [facility])    Suspected COVID-19 virus infection 06/29/2020   Formatting of this note might be different from the original. [name] presents via virtual visit with complaints of cough with dark yellow and green mucous, nasal congestion and stuffiness, low grade fevers, sore throat, nausea, vomiting, and recent sick exposure. She had a recent negative covid test last week, and started Augmentin  for a URI after her negative Covid results.  She states she start   Thyroid  nodule     Past Surgical History:  Procedure Laterality Date   APPENDECTOMY     CHOLECYSTECTOMY     LEFT HEART  CATH AND CORONARY ANGIOGRAPHY Left 02/20/2024   Procedure: LEFT HEART CATH AND CORONARY ANGIOGRAPHY;  Surgeon: Fernand Denyse LABOR, MD;  Location: [facility] INVASIVE CV LAB;  Service: Cardiovascular;  Laterality: Left;    Social History   Socioeconomic History   Marital status: Married    Spouse name: Not on file   Number of children: Not on file   Years of education: Not on file   Highest education level: Not on file  Occupational History   Not on file  Tobacco Use   Smoking status: Never    Passive exposure: Never   Smokeless tobacco: Never  Vaping Use   Vaping status: Never Used  Substance and Sexual Activity   Alcohol use: Not Currently    Comment: ocassionally   Drug use: Never   Sexual activity: Yes    Birth control/protection: None, Other-see comments    Comment: husband has vasectomy  Other Topics Concern   Not on file  Social History Narrative   Not on file   Social Drivers of Health   Tobacco Use: Low Risk (11/21/2024)   Patient History    Smoking Tobacco Use: Never    Smokeless Tobacco Use: Never    Passive Exposure: Never  Financial Resource Strain: Low Risk  (09/17/2024)   Received from [facility]   Overall Financial Resource Strain (CARDIA)    Difficulty  of Paying Living Expenses: Not hard at all  Food Insecurity: No Food Insecurity (09/17/2024)   Received from [facility]   Epic    Within the past 12 months, you worried that your food would run out before you got the money to buy more.: Never true    Within the past 12 months, the food you bought just didn't last and you didn't have money to get more.: Never true  Transportation Needs: No Transportation Needs (09/17/2024)   Received from [facility] - Transportation    In the past 12 months, has lack of transportation kept you from medical appointments or from getting medications?: No    Lack of Transportation (Non-Medical): No  Physical  Activity: Not on file  Stress: Not on file  Social Connections: Not on file  Intimate Partner Violence: Not on file  Depression (PHQ2-9): High Risk (02/05/2024)   Depression (PHQ2-9)    PHQ-2 Score: 15  Alcohol Screen: Not on file  Housing: Low Risk  (09/17/2024)   Received from [facility]   Epic    In the last 12 months, was there a time when you were not able to pay the mortgage or rent on time?: No    In the past 12 months, how many times have you moved where you were living?: 0    At any time in the past 12 months, were you homeless or living in a shelter (including now)?: No  Utilities: Not At Risk (09/17/2024)   Received from [facility]   Epic    In the past 12 months has the electric, gas, oil, or water company threatened to shut off services in your home?: No  Health Literacy: Not on file    Family History  Problem Relation Age of Onset   Heart attack Father    Vascular Disease Father    Vascular Disease Maternal Uncle    Obesity Paternal Aunt    Vascular Disease Paternal Aunt    Heart attack Maternal Grandmother    Ovarian cancer Maternal Grandmother    Vascular Disease Maternal Grandfather    Stroke Paternal Grandmother     Allergies[1]  Review of Systems  All other systems reviewed and are negative.      Objective:   BP 122/76   Pulse 65   Ht 5' 5 (1.651 m)   Wt 177 lb 6.4 oz (80.5 kg)   SpO2 94%   BMI 29.52 kg/m   Vitals:   11/21/24 1114  BP: 122/76  Pulse: 65  Height: 5' 5 (1.651 m)  Weight: 177 lb 6.4 oz (80.5 kg)  SpO2: 94%  BMI (Calculated): 29.52    Physical Exam Vitals and nursing note reviewed.  Constitutional:      Appearance: Normal appearance. She is normal weight.  HENT:     Head: Normocephalic.  Eyes:     Extraocular Movements: Extraocular movements intact.     Conjunctiva/sclera: Conjunctivae normal.     Pupils: Pupils are equal, round, and reactive to light.  Cardiovascular:      Rate and Rhythm: Normal rate.  Pulmonary:     Effort: Pulmonary effort is normal.  Neurological:     General: No focal deficit present.     Mental Status: She is alert and oriented to person, place, and time. Mental status is at baseline.  Psychiatric:        Mood and Affect: Mood normal.  Behavior: Behavior normal.        Thought Content: Thought content normal.      No results found for any visits on 11/21/24.  Recent Results (from the past 2160 hours)  POCT Urinalysis Dipstick (18997)     Status: Abnormal   Collection Time: 09/16/24  1:19 PM  Result Value Ref Range   Color, UA Yellow    Clarity, UA Cloudy    Glucose, UA Negative Negative   Bilirubin, UA Negative    Ketones, UA Negative    Spec Grav, UA >=1.030 (A) 1.010 - 1.025   Blood, UA Moderate    pH, UA 5.5 5.0 - 8.0   Protein, UA Negative Negative   Urobilinogen, UA 0.2 0.2 or 1.0 E.U./dL   Nitrite, UA Negative    Leukocytes, UA Trace (A) Negative   Appearance Cloudy    Odor Yes   Urine Culture     Status: None   Collection Time: 09/16/24  3:53 PM   Specimen: Urine   UR  Result Value Ref Range   Urine Culture, Routine Final report    Organism ID, Bacteria Lactobacillus species     Comment: Greater than 100,000 colony forming units per mL Susceptibility not normally performed on this organism.   Urinalysis, Routine w reflex microscopic -Urine, Clean Catch     Status: Abnormal   Collection Time: 09/21/24  8:10 AM  Result Value Ref Range   Color, Urine YELLOW (A) YELLOW   APPearance CLOUDY (A) CLEAR   Specific Gravity, Urine 1.020 1.005 - 1.030   pH 5.0 5.0 - 8.0   Glucose, UA NEGATIVE NEGATIVE mg/dL   Hgb urine dipstick MODERATE (A) NEGATIVE   Bilirubin Urine NEGATIVE NEGATIVE   Ketones, ur NEGATIVE NEGATIVE mg/dL   Protein, ur NEGATIVE NEGATIVE mg/dL   Nitrite NEGATIVE NEGATIVE   Leukocytes,Ua MODERATE (A) NEGATIVE   RBC / HPF 0-5 0 - 5 RBC/hpf   WBC, UA 21-50 0 - 5 WBC/hpf   Bacteria, UA NONE  SEEN NONE SEEN   Squamous Epithelial / HPF 21-50 0 - 5 /HPF   Mucus PRESENT     Comment: Performed at [facility], [address] [place], [place] [address]  CBC     Status: None   Collection Time: 09/21/24  8:10 AM  Result Value Ref Range   WBC 9.1 4.0 - 10.5 K/uL   RBC 4.11 3.87 - 5.11 MIL/uL   Hemoglobin 12.7 12.0 - 15.0 g/dL   HCT 61.1 63.9 - 53.9 %   MCV 94.4 80.0 - 100.0 fL   MCH 30.9 26.0 - 34.0 pg   MCHC 32.7 30.0 - 36.0 g/dL   RDW 87.5 88.4 - 84.4 %   Platelets 213 150 - 400 K/uL   nRBC 0.0 0.0 - 0.2 %    Comment: Performed at [facility], [address] [place], [place] [address]  Basic metabolic panel     Status: Abnormal   Collection Time: 09/21/24  8:10 AM  Result Value Ref Range   Sodium 138 135 - 145 mmol/L   Potassium 3.6 3.5 - 5.1 mmol/L   Chloride 106 98 - 111 mmol/L   CO2 23 22 - 32 mmol/L   Glucose, Bld 111 (H) 70 - 99 mg/dL    Comment: Glucose reference range applies only to samples taken after fasting for at least 8 hours.   BUN 8 6 - 20 mg/dL   Creatinine, Ser 9.13 0.44 - 1.00 mg/dL  Calcium  9.3 8.9 - 10.3 mg/dL   GFR, Estimated >39 >39 mL/min    Comment: (NOTE) Calculated using the CKD-EPI Creatinine Equation (2021)    Anion gap 10 5 - 15    Comment: Performed at [facility], [address] [place], [place] [address]  POC urine preg, ED     Status: None   Collection Time: 09/21/24  8:34 AM  Result Value Ref Range   Preg Test, Ur NEGATIVE NEGATIVE    Comment:        THE SENSITIVITY OF THIS METHODOLOGY IS >20 mIU/mL.   POCT urinalysis dipstick     Status: Abnormal   Collection Time: 10/14/24  2:36 PM  Result Value Ref Range   Color, UA     Clarity, UA     Glucose, UA Negative Negative   Bilirubin, UA Negative    Ketones, UA Negative    Spec Grav, UA >=1.030 (A) 1.010 - 1.025   Blood, UA Positive    pH, UA 5.0 5.0 - 8.0   Protein, UA Positive (A) Negative   Urobilinogen, UA 0.2 0.2 or 1.0 E.U./dL    Nitrite, UA Negative    Leukocytes, UA Negative Negative   Appearance     Odor    POCT XPERT XPRESS SARS COVID-2/FLU/RSV [[id number]]     Status: Normal   Collection Time: 10/14/24  3:14 PM  Result Value Ref Range   SARS Coronavirus 2 Negative    FLU A Negative    FLU B Negative    RSV RNA, PCR Negative        Assessment & Plan:   Assessment & Plan     No follow-ups on file.   Total time spent: {AMA time spent:29001} minutes  ALAN CHRISTELLA ARRANT, FNP  11/21/2024   This document may have been prepared by [facility] Voice Recognition software and as such may include unintentional dictation errors.     [1]  Allergies Allergen Reactions   Ciprofloxacin  Nausea And Vomiting and Other (See Comments)    Neuropathy  Neuropathy  Neuropathy  Neuropathy  Neuropathy   Nitrofurantoin Hives    Other reaction(s): Hives/Swelling-Allergy   Sulfa Antibiotics Nausea And Vomiting, Other (See Comments) and Rash    CAUSES PAIN, PT REPORTS   "

## 2024-11-22 ENCOUNTER — Encounter: Payer: Self-pay | Admitting: Family

## 2024-11-22 ENCOUNTER — Other Ambulatory Visit: Payer: Self-pay

## 2024-11-26 MED ORDER — VILAZODONE HCL 20 MG PO TABS: 1.0000 | ORAL_TABLET | Freq: Every day | ORAL | 1 refills | Status: AC

## 2024-11-26 MED ORDER — AMPHETAMINE-DEXTROAMPHET ER 20 MG PO CP24: 20.0000 mg | ORAL_CAPSULE | Freq: Two times a day (BID) | ORAL | 0 refills | Status: AC

## 2024-11-29 NOTE — Progress Notes (Unsigned)
 "  Established Patient Office Visit  Subjective:  Patient ID: Sandra Aguilar, female    DOB: [birth year]  Age: 49 y.o. MRN: [id number]  Chief Complaint  Patient presents with   Acute Visit    Diarrhea thinks there is blood in her stool.     Diarrhea  This is a new problem. The current episode started in the past 7 days. The problem occurs less than 2 times per day. Diarrhea characteristics: possible blood, patient unsure. The patient states that diarrhea does not awaken her from sleep. The symptoms are aggravated by stress. Risk factors include suspect food intake. She has tried nothing for the symptoms. The treatment provided no relief.    No other concerns at this time.   Past Medical History:  Diagnosis Date   Cough 07/16/2020   Shantasia presents via virtual visit with complaints of cough with dark yellow and green mucous, nasal congestion and stuffiness, low grade fevers, sore throat, nausea, vomiting, and recent sick exposure. She had a recent negative covid test last week, and started Augmentin  for a URI after her negative Covid results.  She states she started feeling a little better then started feeling bad again.     Depression    Family history of breast cancer 05/27/2018   Fibromyalgia    Insomnia    Lung nodule    Lung nodule, solitary 09/04/2020   PTSD (post-traumatic stress disorder)    Strep pharyngitis 02/17/2021   Last Assessment & Plan: Formatting of this note might be different from the original. Will not check a rapid strep test for her sore throat due to her husband was positive for strep throat diagnosed yesterday in the same clinic.  Will prescribe Pen-Vee K 500 mg 1 every 12 hours for 10 days.  Be sure to drink plenty of fluids including Gatorade, take Tylenol  or Advil  for pain or fever as needed.  I   Stroke [facility])    Suspected COVID-19 virus infection 06/29/2020   Formatting of this note might be different from the original. Avie presents via virtual visit  with complaints of cough with dark yellow and green mucous, nasal congestion and stuffiness, low grade fevers, sore throat, nausea, vomiting, and recent sick exposure. She had a recent negative covid test last week, and started Augmentin  for a URI after her negative Covid results.  She states she start   Thyroid  nodule     Past Surgical History:  Procedure Laterality Date   APPENDECTOMY     CHOLECYSTECTOMY     LEFT HEART CATH AND CORONARY ANGIOGRAPHY Left 02/20/2024   Procedure: LEFT HEART CATH AND CORONARY ANGIOGRAPHY;  Surgeon: Fernand Denyse LABOR, MD;  Location: [facility] INVASIVE CV LAB;  Service: Cardiovascular;  Laterality: Left;    Social History   Socioeconomic History   Marital status: Married    Spouse name: Not on file   Number of children: Not on file   Years of education: Not on file   Highest education level: Not on file  Occupational History   Not on file  Tobacco Use   Smoking status: Never    Passive exposure: Never   Smokeless tobacco: Never  Vaping Use   Vaping status: Never Used  Substance and Sexual Activity   Alcohol use: Not Currently    Comment: ocassionally   Drug use: Never   Sexual activity: Yes    Birth control/protection: None, Other-see comments    Comment: husband has vasectomy  Other Topics Concern   Not  on file  Social History Narrative   Not on file   Social Drivers of Health   Tobacco Use: Low Risk (11/21/2024)   Patient History    Smoking Tobacco Use: Never    Smokeless Tobacco Use: Never    Passive Exposure: Never  Financial Resource Strain: Low Risk  (09/17/2024)   Received from [facility]   Overall Financial Resource Strain (CARDIA)    Difficulty of Paying Living Expenses: Not hard at all  Food Insecurity: No Food Insecurity (09/17/2024)   Received [facility]   Epic    Within the past 12 months, you worried that your food would run out before you got the money to buy more.: Never true     Within the past 12 months, the food you bought just didn't last and you didn't have money to get more.: Never true  Transportation Needs: No Transportation Needs (09/17/2024)   Received from [facility] Transportation    In the past 12 months, has lack of transportation kept you from medical appointments or from getting medications?: No    Lack of Transportation (Non-Medical): No  Physical Activity: Not on file  Stress: Not on file  Social Connections: Not on file  Intimate Partner Violence: Not on file  Depression (PHQ2-9): High Risk (02/05/2024)   Depression (PHQ2-9)    PHQ-2 Score: 15  Alcohol Screen: Not on file  Housing: Low Risk  (09/17/2024)   Received from [facility]   Epic    In the last 12 months, was there a time when you were not able to pay the mortgage or rent on time?: No    In the past 12 months, how many times have you moved where you were living?: 0    At any time in the past 12 months, were you homeless or living in a shelter (including now)?: No  Utilities: Not At Risk (09/17/2024)   Received from [facility]   Epic    In the past 12 months has the electric, gas, oil, or water company threatened to shut off services in your home?: No  Health Literacy: Not on file    Family History  Problem Relation Age of Onset   Heart attack Father    Vascular Disease Father    Vascular Disease Maternal Uncle    Obesity Paternal Aunt    Vascular Disease Paternal Aunt    Heart attack Maternal Grandmother    Ovarian cancer Maternal Grandmother    Vascular Disease Maternal Grandfather    Stroke Paternal Grandmother     Allergies[1]  Review of Systems  Gastrointestinal:  Positive for diarrhea.  All other systems reviewed and are negative.      Objective:   BP 123/77   Pulse 66   Ht 5' 5 (1.651 m)   Wt 176 lb 12.8 oz (80.2 kg)   SpO2 98%   BMI 29.42 kg/m   Vitals:   09/04/24 1417  BP: 123/77   Pulse: 66  Height: 5' 5 (1.651 m)  Weight: 176 lb 12.8 oz (80.2 kg)  SpO2: 98%  BMI (Calculated): 29.42    Physical Exam Vitals and nursing note reviewed.  Constitutional:      Appearance: Normal appearance. She is normal weight.  HENT:     Head: Normocephalic.  Eyes:     Extraocular Movements: Extraocular movements intact.     Conjunctiva/sclera: Conjunctivae normal.  Pupils: Pupils are equal, round, and reactive to light.  Cardiovascular:     Rate and Rhythm: Normal rate.  Pulmonary:     Effort: Pulmonary effort is normal.  Neurological:     General: No focal deficit present.     Mental Status: She is alert and oriented to person, place, and time. Mental status is at baseline.  Psychiatric:        Mood and Affect: Mood normal.        Behavior: Behavior normal.        Thought Content: Thought content normal.      No results found for any visits on 09/04/24.  Recent Results (from the past 2160 hours)  POCT Urinalysis Dipstick (18997)     Status: Abnormal   Collection Time: 09/16/24  1:19 PM  Result Value Ref Range   Color, UA Yellow    Clarity, UA Cloudy    Glucose, UA Negative Negative   Bilirubin, UA Negative    Ketones, UA Negative    Spec Grav, UA >=1.030 (A) 1.010 - 1.025   Blood, UA Moderate    pH, UA 5.5 5.0 - 8.0   Protein, UA Negative Negative   Urobilinogen, UA 0.2 0.2 or 1.0 E.U./dL   Nitrite, UA Negative    Leukocytes, UA Trace (A) Negative   Appearance Cloudy    Odor Yes   Urine Culture     Status: None   Collection Time: 09/16/24  3:53 PM   Specimen: Urine   UR  Result Value Ref Range   Urine Culture, Routine Final report    Organism ID, Bacteria Lactobacillus species     Comment: Greater than 100,000 colony forming units per mL Susceptibility not normally performed on this organism.   Urinalysis, Routine w reflex microscopic -Urine, Clean Catch     Status: Abnormal   Collection Time: 09/21/24  8:10 AM  Result Value Ref Range    Color, Urine YELLOW (A) YELLOW   APPearance CLOUDY (A) CLEAR   Specific Gravity, Urine 1.020 1.005 - 1.030   pH 5.0 5.0 - 8.0   Glucose, UA NEGATIVE NEGATIVE mg/dL   Hgb urine dipstick MODERATE (A) NEGATIVE   Bilirubin Urine NEGATIVE NEGATIVE   Ketones, ur NEGATIVE NEGATIVE mg/dL   Protein, ur NEGATIVE NEGATIVE mg/dL   Nitrite NEGATIVE NEGATIVE   Leukocytes,Ua MODERATE (A) NEGATIVE   RBC / HPF 0-5 0 - 5 RBC/hpf   WBC, UA 21-50 0 - 5 WBC/hpf   Bacteria, UA NONE SEEN NONE SEEN   Squamous Epithelial / HPF 21-50 0 - 5 /HPF   Mucus PRESENT     Comment: Performed at [facility], [address] [place], [place] [address]  CBC     Status: None   Collection Time: 09/21/24  8:10 AM  Result Value Ref Range   WBC 9.1 4.0 - 10.5 K/uL   RBC 4.11 3.87 - 5.11 MIL/uL   Hemoglobin 12.7 12.0 - 15.0 g/dL   HCT 61.1 63.9 - 53.9 %   MCV 94.4 80.0 - 100.0 fL   MCH 30.9 26.0 - 34.0 pg   MCHC 32.7 30.0 - 36.0 g/dL   RDW 87.5 88.4 - 84.4 %   Platelets 213 150 - 400 K/uL   nRBC 0.0 0.0 - 0.2 %    Comment: Performed at [facility], [address], [place], [place] [address]  Basic metabolic panel     Status: Abnormal   Collection Time: 09/21/24  8:10 AM  Result Value Ref Range   Sodium 138 135 - 145 mmol/L   Potassium 3.6 3.5 - 5.1 mmol/L   Chloride 106 98 - 111 mmol/L   CO2 23 22 - 32 mmol/L   Glucose, Bld 111 (H) 70 - 99 mg/dL    Comment: Glucose reference range applies only to samples taken after fasting for at least 8 hours.   BUN 8 6 - 20 mg/dL   Creatinine, Ser 9.13 0.44 - 1.00 mg/dL   Calcium  9.3 8.9 - 10.3 mg/dL   GFR, Estimated >39 >39 mL/min    Comment: (NOTE) Calculated using the CKD-EPI Creatinine Equation (2021)    Anion gap 10 5 - 15    Comment: Performed at [facility], [address] [place], [place] [address]  POC urine preg, ED     Status: None   Collection Time: 09/21/24  8:34 AM  Result Value Ref Range   Preg Test, Ur NEGATIVE NEGATIVE     Comment:        THE SENSITIVITY OF THIS METHODOLOGY IS >20 mIU/mL.   POCT urinalysis dipstick     Status: Abnormal   Collection Time: 10/14/24  2:36 PM  Result Value Ref Range   Color, UA     Clarity, UA     Glucose, UA Negative Negative   Bilirubin, UA Negative    Ketones, UA Negative    Spec Grav, UA >=1.030 (A) 1.010 - 1.025   Blood, UA Positive    pH, UA 5.0 5.0 - 8.0   Protein, UA Positive (A) Negative   Urobilinogen, UA 0.2 0.2 or 1.0 E.U./dL   Nitrite, UA Negative    Leukocytes, UA Negative Negative   Appearance     Odor    POCT XPERT XPRESS SARS COVID-2/FLU/RSV [[id number]]     Status: Normal   Collection Time: 10/14/24  3:14 PM  Result Value Ref Range   SARS Coronavirus 2 Negative    FLU A Negative    FLU B Negative    RSV RNA, PCR Negative        Assessment & Plan Diarrhea, unspecified type Suspect issue was related to food.  Will determine if it was or not based on her recovery.     No follow-ups on file.   Total time spent: 20 minutes  ALAN CHRISTELLA ARRANT, FNP  09/04/2024   This document may have been prepared by [facility] Voice Recognition software and as such may include unintentional dictation errors.      [1]  Allergies Allergen Reactions   Ciprofloxacin  Nausea And Vomiting and Other (See Comments)    Neuropathy  Neuropathy  Neuropathy  Neuropathy  Neuropathy   Nitrofurantoin Hives    Other reaction(s): Hives/Swelling-Allergy   Sulfa Antibiotics Nausea And Vomiting, Other (See Comments) and Rash    CAUSES PAIN, PT REPORTS   "

## 2024-12-01 ENCOUNTER — Encounter: Payer: Self-pay | Admitting: Family

## 2025-02-19 ENCOUNTER — Ambulatory Visit: Admitting: Family
